# Patient Record
Sex: Male | Born: 1985
Health system: Southern US, Community
[De-identification: ages and names within clinical notes are randomized; demographics above are authoritative.]

## PROBLEM LIST (undated history)

## (undated) ENCOUNTER — Ambulatory Visit (HOSPITAL_COMMUNITY): Admission: EM | Payer: 59

## (undated) VITALS — BP 128/65 | HR 101 | Temp 98.6°F | Resp 18 | Ht 73.0 in | Wt 256.0 lb

## (undated) DIAGNOSIS — R51 Headache: Secondary | ICD-10-CM

## (undated) DIAGNOSIS — I10 Essential (primary) hypertension: Secondary | ICD-10-CM

## (undated) DIAGNOSIS — I219 Acute myocardial infarction, unspecified: Secondary | ICD-10-CM

## (undated) DIAGNOSIS — R519 Headache, unspecified: Secondary | ICD-10-CM

## (undated) DIAGNOSIS — F32A Depression, unspecified: Secondary | ICD-10-CM

## (undated) DIAGNOSIS — D849 Immunodeficiency, unspecified: Secondary | ICD-10-CM

## (undated) DIAGNOSIS — Z8781 Personal history of (healed) traumatic fracture: Secondary | ICD-10-CM

## (undated) DIAGNOSIS — F329 Major depressive disorder, single episode, unspecified: Secondary | ICD-10-CM

## (undated) DIAGNOSIS — F99 Mental disorder, not otherwise specified: Secondary | ICD-10-CM

## (undated) DIAGNOSIS — B20 Human immunodeficiency virus [HIV] disease: Secondary | ICD-10-CM

## (undated) HISTORY — DX: Mental disorder, not otherwise specified: F99

## (undated) HISTORY — DX: Depression, unspecified: F32.A

## (undated) HISTORY — DX: Human immunodeficiency virus (HIV) disease: B20

## (undated) HISTORY — DX: Major depressive disorder, single episode, unspecified: F32.9

## (undated) HISTORY — DX: Essential (primary) hypertension: I10

## (undated) HISTORY — PX: NO PAST SURGERIES: SHX2092

---

## 2008-01-03 ENCOUNTER — Ambulatory Visit: Payer: Self-pay | Admitting: Psychiatry

## 2008-01-04 ENCOUNTER — Inpatient Hospital Stay (HOSPITAL_COMMUNITY): Admission: AD | Admit: 2008-01-04 | Discharge: 2008-01-09 | Payer: Self-pay | Admitting: Psychiatry

## 2008-10-29 ENCOUNTER — Emergency Department (HOSPITAL_COMMUNITY): Admission: EM | Admit: 2008-10-29 | Discharge: 2008-10-29 | Payer: Self-pay | Admitting: *Deleted

## 2010-10-05 NOTE — H&P (Signed)
NAMEHARJOT, Hector [name]                ACCOUNT NO.:  [id number]   MEDICAL RECORD NO.:  [id number]          PATIENT TYPE:  IPS   LOCATION:  0405                          FACILITY:  [facility]   PHYSICIAN:  Anselm Jungling, MD  DATE OF BIRTH:  [birth year]   DATE OF ADMISSION:  01/04/2008  DATE OF DISCHARGE:                       PSYCHIATRIC ADMISSION ASSESSMENT   This is an involuntary admission to the services of Dr. Geralyn Flash.   IDENTIFYING INFORMATION:  This is a 25 year old, single, African  American male.  He presented to the emergency department at [facility] yesterday.  He reported that he was having suicidal, as well as  homicidal thoughts.  He states that he has been in the hospital before  for the same.  Last time was about a year ago.  He states the voices  keep getting stronger and louder.   PAST PSYCHIATRIC HISTORY:  He reports that he was admitted to [facility] back in 03/2007, for depression.   SOCIAL HISTORY:  He reports that he is a high school graduate in 2006.  He is single.  He has no children.  He received some type of disability  payment from 07/22/1990 to 10/2004, for learning differences, bipolar  and depression.  He states he is currently reapplying for re-  establishment of these benefits.   FAMILY HISTORY:  He states two of his maternal aunts were bipolar.  Both  are deceased.   ALCOHOL AND DRUG HISTORY:  He denies and indeed, he had no alcohol and  his UDS was negative.   MEDICAL HISTORY:  None.   PRIMARY CARE Karelyn Brisby:  None and he has no psychiatric care at this  time.   MEDICAL PROBLEMS:  There are none substantiated.  He reported that he  was hypertensive, had diabetes and high cholesterol.  Well, his glucose  was 89 and his blood pressure is fine and he is not prescribed any  medications.   DRUG ALLERGIES:  No known drug allergies.   PHYSICAL EXAM:  He was medically cleared in the ED at [facility]  As  already stated, he had no  alcohol, no drugs in his urine.  His blood  glucose was 89, his vital signs were fine.  His other labs had no  abnormal findings.   His vital signs on admission to our unit show that he is 73 inches tall,  he weighs 229, his temperature was 97.9, blood pressure was 132/84 to  121/80, pulse is 67 to 83, respirations are 18.   MENTAL STATUS EXAM:  Today, he is alert and oriented.  He is  appropriately groomed, dressed and nourished.  His speech is not  pressured.  His mood is appropriate to the situation.  His affect had a  normal range.  Thought processes are clearing.  Specifically, he denies  any suicidal and homicidal ideations today and he denies any auditory or  visual hallucinations today.  Judgment and insight are fair.  Concentration and memory are intact.  Intelligence is average to below.  He reports that he is feeling better today.  However, he states,  I  can't sleep at night.  Last night I just dozed on and off.   Dr. Electa Sniff did see the patient yesterday.  His impression was rule out  schizoaffective disorder.   HIS HISTORICAL DIAGNOSIS:  AXIS I:  Bipolar, depressed, with psychotic  features at this point.  AXIS II:  Learning disability.  AXIS III:  None.  AXIS IV:  Economic issues.  AXIS V:  20.   PLAN:  The plan is to admit for safety and stabilization.  Dr. Electa Sniff  gave him a stat dose of Risperdal yesterday, 2 mg.  He instituted  Risperdal 2 mg at h.s. and Trazodone 50 mg at h.s.  May repeat times  one.   Today, the patient is reporting back pain.  We will get him a Xylocaine  back patch and ibuprofen 800 mg every 6 hours p.r.n. back pain.  We will  get him set up at [facility] once discharged.  Estimated  length of stay is five days.      Mickie Leonarda Salon, P.A.-C.      Anselm Jungling, MD  Electronically Signed    MD/MEDQ  D:  01/05/2008  T:  01/05/2008  Job:  [id number]

## 2010-10-08 NOTE — Discharge Summary (Signed)
Hector [name], FAGIN                ACCOUNT NO.:  [id number]   MEDICAL RECORD NO.:  [id number]          PATIENT TYPE:  IPS   LOCATION:  0402                          FACILITY:  BH   PHYSICIAN:  Anselm Jungling, MD  DATE OF BIRTH:  1985-08-07   DATE OF ADMISSION:  01/04/2008  DATE OF DISCHARGE:  01/09/2008                               DISCHARGE SUMMARY   IDENTIFYING DATA/REASON FOR ADMISSION:  The patient is a 25 year old  single African American male who stated that he had recently moved here  from the state of [place] for a family funeral.  Once here, he decided  to stay.  He reported that he was staying with an elderly cousin in the  [place] area.  He presented voluntarily due to increasing symptoms of  psychosis, including auditory hallucinations, paranoia, thoughts of  overdosing, and thoughts of poisoning others.  He denied substance  abuse.  Please refer to the admission note for further details  pertaining to the symptoms, circumstances and history that led to his  hospitalization.  He was given an initial AXIS I diagnosis of rule out  schizoaffective disorder NOS.   MEDICAL AND LABORATORY:  The patient was medically and physically  assessed by the psychiatric nurse practitioner.  He had a history of non-  insulin-dependent diabetes mellitus that had not necessarily been  evaluated or diagnosed clearly in the past, but the patient seemed to  have some awareness of previous problems with hyperglycemia.  Nonetheless, he was treated with a regimen of metformin 500 mg p.o.  q.a.m. after he showed a daily pattern of hyperglycemia.  At the time of  discharge, he was referred to a medical clinic in [facility] for followup  regarding this development.  There were no other significant medical  issues.   HOSPITAL COURSE:  The patient was admitted to the adult inpatient  psychiatric service.  He presented as a well-nourished, normally-  developed male who was alert, fully oriented,  very polite, pleasant, and  seemed well organized.  He did not appear to have a psychotic disorder  but reported auditory hallucinations.  He made no overtly delusional  statements.  His mood was depressed with sad affect.  He verbalized a  strong desire for help.   The patient's complaints of auditory hallucinations were taken at face  value.  As such, he was started on low-dose Risperdal.  He reported this  helpful over the next few days.  He was involved in various therapeutic  groups and activities and was a good participant in the treatment  program.  He stated on the fourth hospital day that with respect to the  medication effect, I think different; I don't worry about what people  say no more.  This gave the impression that the patient had perhaps  been dealing with some paranoid ideation and affect at the time of  admission.   The patient worked with case management towards an aftercare plan.  He  eventually agreed to outpatient followup as follows below.   AFTERCARE:  The patient was to follow up at [facility] with an appointment on January 11, 2008.   DISCHARGE MEDICATIONS:  Risperdal 4 mg q.h.s. and metformin 500 mg p.o.  q.a.m.   He was directed to follow up for his medical problems at the [facility] in [place] on [address].  He was instructed when and  how to arrive there.   DISCHARGE DIAGNOSES:  AXIS I:  Schizoaffective disorder NOS.  AXIS II:  Deferred.  AXIS III:  Diabetes mellitus, NOS.  AXIS IV:  Stressors severe.  AXIS V:  GAF on discharge 60.      Anselm Jungling, MD  Electronically Signed     SPB/MEDQ  D:  01/15/2008  T:  01/15/2008  Job:  [id number]

## 2011-02-18 LAB — GLUCOSE, CAPILLARY: Glucose-Capillary: 147 — ABNORMAL HIGH

## 2011-09-25 ENCOUNTER — Encounter (HOSPITAL_COMMUNITY): Payer: Self-pay | Admitting: *Deleted

## 2011-09-25 ENCOUNTER — Emergency Department (INDEPENDENT_AMBULATORY_CARE_PROVIDER_SITE_OTHER)
Admission: EM | Admit: 2011-09-25 | Discharge: 2011-09-25 | Disposition: A | Discharge status: Home / Self Care | Payer: Medicare Other | Source: Home / Self Care | Attending: Emergency Medicine | Admitting: Emergency Medicine

## 2011-09-25 DIAGNOSIS — T148XXA Other injury of unspecified body region, initial encounter: Secondary | ICD-10-CM

## 2011-09-25 DIAGNOSIS — T148 Other injury of unspecified body region: Secondary | ICD-10-CM

## 2011-09-25 DIAGNOSIS — W57XXXA Bitten or stung by nonvenomous insect and other nonvenomous arthropods, initial encounter: Secondary | ICD-10-CM

## 2011-09-25 MED ORDER — HYDROXYZINE HCL 25 MG PO TABS: 25.0000 mg | ORAL_TABLET | Freq: Four times a day (QID) | ORAL | Status: DC

## 2011-09-25 NOTE — Discharge Instructions (Signed)
Bedbugs Bedbugs are tiny bugs that live in and around beds. During the day, they hide in mattresses and other places near beds. They come out at night and bite people lying in bed. They need blood to live and grow. Bedbugs can be found in beds anywhere. Usually, they are found in places where many people come and go (hotels, shelters, hospitals). It does not matter whether the place is dirty or clean. Getting bitten by bedbugs rarely causes a medical problem. The biggest problem can be getting rid of them. This often takes the work of a pest control expert. CAUSES  Less use of pesticides. Bedbugs were common before the 1950s. Then, strong pesticides such as DDT nearly wiped them out. Today, these pesticides are not used because they harm the environment and can cause health problems.   More travel. Besides mattresses, bedbugs can also live in clothing and luggage. They can come along as people travel from place to place. Bedbugs are more common in certain parts of the world. When people travel to those areas, the bugs can come home with them.   Presence of birds and bats. Bedbugs often infest birds and bats. If you have these animals in or near your home, bedbugs may infest your house, too.  SYMPTOMS It does not hurt to be bitten by a bedbug. You will probably not wake up when you are bitten. Bedbugs usually bite areas of the skin that are not covered. Symptoms may show when you wake up, or they may take a day or more to show up. Symptoms may include:  Small red bumps on the skin. These might be lined up in a row or clustered in a group.   A darker red dot in the middle of red bumps.   Blisters on the skin. There may be swelling and very bad itching. These may be signs of an allergic reaction. This does not happen often.  DIAGNOSIS Bedbug bites might look and feel like other types of insect bites. The bugs do not stay on the body like ticks or lice. They bite, drop off, and crawl away to hide.  Your caregiver will probably:  Ask about your symptoms.   Ask about your recent activities and travel.   Check your skin for bedbug bites.   Ask you to check at home for signs of bedbugs. You should look for:   Spots or stains on the bed or nearby. This could be from bedbugs that were crushed or from their eggs or waste.   Bedbugs themselves. They are reddish-brown, oval, and flat. They do not fly. They are about the size of an apple seed.   Places to look for bedbugs include:   Beds. Check mattresses, headboards, box springs, and bed frames.   On drapes and curtains near the bed.   Under carpeting in the bedroom.   Behind electrical outlets.   Behind any wallpaper that is peeling.   Inside luggage.  TREATMENT Most bedbug bites do not need treatment. They usually go away on their own in a few days. The bites are not dangerous. However, treatment may be needed if you have scratched so much that your skin has become infected. You may also need treatment if you are allergic to bedbug bites. Treatment options include:  A drug that stops swelling and itching (corticosteroid). Usually, a cream is rubbed on the skin. If you have a bad rash, you may be given a corticosteroid pill.   Oral antihistamines. These are   pills to help control itching.   Antibiotic medicines. An antibiotic may be prescribed for infected skin.  HOME CARE INSTRUCTIONS   Take any medicine prescribed by your caregiver for your bites. Follow the directions carefully.   Consider wearing pajamas with long sleeves and pant legs.   Your bedroom may need to be treated. A pest control expert should make sure the bedbugs are gone. You may need to throw away mattresses or luggage. Ask the pest control expert what you can do to keep the bedbugs from coming back. Common suggestions include:   Putting a plastic cover over your mattress.   Washing and drying your clothes and bedding in hot water and a hot dryer. The  temperature should be hotter than 120 F (48.9 C). Bedbugs are killed by high temperatures.   Vacuuming carefully all around your bed. Vacuum in all cracks and crevices where the bugs might hide. Do this often.   Carefully checking all used furniture, bedding, or clothes that you bring into your house.   Eliminating bird nests and bat roosts.   If you get bedbug bites when traveling, check all your possessions carefully before bringing them into your house. If you find any bugs on clothes or in your luggage, consider throwing those items away.  SEEK MEDICAL CARE IF:  You have red bug bites that keep coming back.   You have red bug bites that itch badly.   You have bug bites that cause a skin rash.   You have scratch marks that are red and sore.  SEEK IMMEDIATE MEDICAL CARE IF: You have a fever. Document Released: 06/11/2010 Document Revised: 04/28/2011 Document Reviewed: 06/11/2010 Exit[employer] Patient Information 2012 [employer]. 

## 2011-09-25 NOTE — ED Provider Notes (Addendum)
History     CSN: [id number]  Arrival date & time 09/25/11  1446   First MD Initiated Contact with Patient 09/25/11 1453      Chief Complaint  Patient presents with  . Insect Bite    (Consider location/radiation/quality/duration/timing/severity/associated sxs/prior treatment) HPI Comments: Patient woke up this morning (slept overnight in a local motel)  With a "raised rashes" on both, his left arm left shoulder upper torso some on his back "it itches a lot and her swollen". I think something bit me overnight. Patient denies any difficulty breathing or swallowing. This rash is present on his lower extremities.  The history is provided by the patient.    History reviewed. No pertinent past medical history.  History reviewed. No pertinent past surgical history.  History reviewed. No pertinent family history.  History  Substance Use Topics  . Smoking status: Not on file  . Smokeless tobacco: Not on file  . Alcohol Use: Not on file      Review of Systems  Constitutional: Negative for fever, chills, activity change and fatigue.  HENT: Negative for nosebleeds and congestion.   Eyes: Negative for pain, redness and itching.  Skin: Positive for rash. Negative for color change and wound.    Allergies  Review of patient's allergies indicates no known allergies.  Home Medications  No current outpatient prescriptions on file.  BP 143/82  Pulse 90  Temp(Src) 98.4 F (36.9 C) (Oral)  Resp 14  SpO2 99%  Physical Exam  Nursing note and vitals reviewed. Constitutional: He appears well-developed and well-nourished.  HENT:  Head: Normocephalic.  Mouth/Throat: No oropharyngeal exudate.  Eyes: Conjunctivae are normal. No scleral icterus.  Neck: Neck supple.  Cardiovascular: Normal rate.   No murmur heard. Pulmonary/Chest: Effort normal and breath sounds normal. No respiratory distress. He has no decreased breath sounds. He has no wheezes. He has no rhonchi. He has no rales.    Abdominal: Soft.  Musculoskeletal: He exhibits no edema and no tenderness.  Lymphadenopathy:    He has no cervical adenopathy.  Neurological: He is alert.  Skin: Rash noted. Rash is papular. There is erythema.       ED Course  Procedures (including critical care time)  Labs Reviewed - No data to display No results found.   No diagnosis found.    MDM  Patient with a to 10 different lesions consistent with an insect bite after having stayed in a motel. No further symptoms. Him to return if any further concerns or changes        Jimmie Molly, MD 09/25/11 1839  Jimmie Molly, MD 09/25/11 [phone]

## 2011-09-25 NOTE — ED Notes (Signed)
Co insect bite this am while staying at a motel, raised area to left arm, left shoulder, and underneath right eye.  Co itching, has taken benadryl, denies any difficulty breathing or swallowing.

## 2011-10-02 ENCOUNTER — Encounter (HOSPITAL_COMMUNITY): Payer: Self-pay | Admitting: Licensed Clinical Social Worker

## 2011-10-02 ENCOUNTER — Telehealth (HOSPITAL_COMMUNITY): Payer: Self-pay | Admitting: Licensed Clinical Social Worker

## 2011-10-02 ENCOUNTER — Inpatient Hospital Stay (HOSPITAL_COMMUNITY)
Admission: EM | Admit: 2011-10-02 | Discharge: 2011-10-08 | DRG: 885 | Disposition: A | Discharge status: Home / Self Care | Payer: Medicare Other | Source: Other Acute Inpatient Hospital | Attending: Psychiatry | Admitting: Psychiatry

## 2011-10-02 DIAGNOSIS — F5105 Insomnia due to other mental disorder: Secondary | ICD-10-CM

## 2011-10-02 DIAGNOSIS — I10 Essential (primary) hypertension: Secondary | ICD-10-CM | POA: Diagnosis present

## 2011-10-02 DIAGNOSIS — Z9114 Patient's other noncompliance with medication regimen: Secondary | ICD-10-CM

## 2011-10-02 DIAGNOSIS — Z91199 Patient's noncompliance with other medical treatment and regimen due to unspecified reason: Secondary | ICD-10-CM

## 2011-10-02 DIAGNOSIS — G4700 Insomnia, unspecified: Secondary | ICD-10-CM | POA: Diagnosis present

## 2011-10-02 DIAGNOSIS — F250 Schizoaffective disorder, bipolar type: Secondary | ICD-10-CM | POA: Diagnosis present

## 2011-10-02 DIAGNOSIS — IMO0002 Reserved for concepts with insufficient information to code with codable children: Secondary | ICD-10-CM

## 2011-10-02 DIAGNOSIS — Z9119 Patient's noncompliance with other medical treatment and regimen: Secondary | ICD-10-CM

## 2011-10-02 DIAGNOSIS — F259 Schizoaffective disorder, unspecified: Principal | ICD-10-CM | POA: Diagnosis present

## 2011-10-02 HISTORY — DX: Personal history of (healed) traumatic fracture: Z87.81
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
## 2011-10-03 ENCOUNTER — Encounter (HOSPITAL_COMMUNITY): Payer: Self-pay | Admitting: *Deleted

## 2011-10-03 DIAGNOSIS — F250 Schizoaffective disorder, bipolar type: Secondary | ICD-10-CM | POA: Diagnosis present

## 2011-10-03 DIAGNOSIS — F259 Schizoaffective disorder, unspecified: Principal | ICD-10-CM

## 2011-10-03 MED ORDER — ALUM & MAG HYDROXIDE-SIMETH 200-200-20 MG/5ML PO SUSP
30.0000 mL | ORAL | Status: DC | PRN
  Administered 2011-10-04: 30 mL via ORAL

## 2011-10-03 MED ORDER — ACETAMINOPHEN 325 MG PO TABS
650.0000 mg | ORAL_TABLET | Freq: Four times a day (QID) | ORAL | Status: DC | PRN
  Administered 2011-10-07: 650 mg via ORAL

## 2011-10-03 MED ORDER — CARBAMAZEPINE ER 200 MG PO TB12
200.0000 mg | ORAL_TABLET | ORAL | Status: DC
  Administered 2011-10-03: 200 mg via ORAL
  Filled 2011-10-03 (×5): qty 1

## 2011-10-03 MED ORDER — MAGNESIUM HYDROXIDE 400 MG/5ML PO SUSP: 30.0000 mL | Freq: Every day | ORAL | Status: DC | PRN

## 2011-10-03 MED ORDER — CARBAMAZEPINE ER 200 MG PO TB12
200.0000 mg | ORAL_TABLET | Freq: Every day | ORAL | Status: DC
  Administered 2011-10-03: 200 mg via ORAL
  Filled 2011-10-03 (×4): qty 1

## 2011-10-03 MED ORDER — CITALOPRAM HYDROBROMIDE 20 MG PO TABS
20.0000 mg | ORAL_TABLET | Freq: Every day | ORAL | Status: DC
  Administered 2011-10-03 – 2011-10-08 (×6): 20 mg via ORAL
  Filled 2011-10-03: qty 1
  Filled 2011-10-03: qty 7
  Filled 2011-10-03 (×6): qty 1

## 2011-10-03 MED ORDER — TRAZODONE HCL 50 MG PO TABS: 50.0000 mg | ORAL_TABLET | Freq: Every evening | ORAL | Status: DC | PRN

## 2011-10-03 MED ORDER — HYDROXYZINE HCL 50 MG PO TABS
50.0000 mg | ORAL_TABLET | Freq: Every evening | ORAL | Status: DC | PRN
  Administered 2011-10-03 – 2011-10-07 (×2): 50 mg via ORAL

## 2011-10-03 MED ORDER — ZOLPIDEM TARTRATE 10 MG PO TABS: 10.0000 mg | ORAL_TABLET | Freq: Every evening | ORAL | Status: DC | PRN

## 2011-10-03 NOTE — Tx Team (Signed)
Initial Interdisciplinary Treatment Plan  PATIENT STRENGTHS: (choose at least two) Ability for insight Average or above average intelligence Capable of independent living Communication skills Motivation for treatment/growth Work skills  PATIENT STRESSORS: Educational concerns Financial difficulties Marital or family conflict Medication change or noncompliance Occupational concerns   PROBLEM LIST: Problem List/Patient Goals Date to be addressed Date deferred Reason deferred Estimated date of resolution  Depression      Anxiety      SI                                           DISCHARGE CRITERIA:  Ability to meet basic life and health needs Adequate post-discharge living arrangements Improved stabilization in mood, thinking, and/or behavior Medical problems require only outpatient monitoring Motivation to continue treatment in a less acute level of care Need for constant or close observation no longer present Reduction of life-threatening or endangering symptoms to within safe limits Safe-care adequate arrangements made Verbal commitment to aftercare and medication compliance  PRELIMINARY DISCHARGE PLAN: Outpatient therapy Return to previous work or school arrangements  PATIENT/FAMIILY INVOLVEMENT: This treatment plan has been presented to and reviewed with the patient, Hector [name].  The patient has been given the opportunity to ask questions and make suggestions.  Arturo Morton 10/03/2011, 1:24 AM

## 2011-10-03 NOTE — Progress Notes (Signed)
Pt currently in group. Pt has had no complaints and interacts well in the milieu. Pt was offered support and encouragement. Pt receptive to treatment and safety maintained on the unit.
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
## 2011-10-03 NOTE — Progress Notes (Addendum)
[facility] Group Notes:  (Counselor/Nursing/MHT/Case Management/Adjunct)  10/03/2011   11:00 Overcoming Obstacles to Wellness  Type of Therapy:  Group Therapy  Participation Level:  Did Not Attend       [name] 10/03/2011  2:30 PM     [facility] Group Notes:  (Counselor/Nursing/MHT/Case Management/Adjunct) 10/03/2011  1:15pm Breathing & Meditation for Anger/Anxiety   Type of Therapy:  Group Therapy  Participation Level:  Did Not Attend - Hector [name] came to the door after group had started, but chose not to come into group  [name] 10/03/2011  3:02 PM
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
## 2011-10-03 NOTE — Progress Notes (Addendum)
[facility] MD Progress Note  10/03/2011 4:12 PM  Diagnosis:  Axis I: Schizoaffective Disorder  ADL's:  Intact  Sleep: Poor  Appetite:  Poor  Suicidal Ideation:  Pt denies any thoughts of being dead now. Homicidal Ideation:  Denies adamantly any homicidal thoughts.  Mental Status Examination/Evaluation: Objective:  Appearance: Casual  Eye Contact::  Good  Speech:  Clear and Coherent  Volume:  Normal  Mood:  Dysphoric  Affect:  Congruent  Thought Process:  Coherent  Orientation:  Full  Thought Content:  WDL  Suicidal Thoughts:  No  Homicidal Thoughts:  No  Memory:  Immediate;   Fair  Judgement:  Fair  Insight:  Fair  Psychomotor Activity:  Normal  Concentration:  Fair  Recall:  Fair  Akathisia:  No  Handed:  Right  AIMS (if indicated):     Assets:  Communication Skills Desire for Improvement  Sleep:  Number of Hours: 4    Vital Signs:Blood pressure 139/79, pulse 109, temperature 99 F (37.2 C), temperature source Oral, resp. rate 18, height 6\' 1"  (1.854 m), weight 116.121 kg (256 lb). Current Medications: Current Facility-Administered Medications  Medication Dose Route Frequency Provider Last Rate Last Dose  . acetaminophen (TYLENOL) tablet 650 mg  650 mg Oral Q6H PRN Wonda Cerise, MD      . alum & mag hydroxide-simeth (MAALOX/MYLANTA) 200-200-20 MG/5ML suspension 30 mL  30 mL Oral Q4H PRN Wonda Cerise, MD      . carbamazepine (TEGRETOL XR) 12 hr tablet 200 mg  200 mg Oral [id number] Mike Craze, MD      . citalopram (CELEXA) tablet 20 mg  20 mg Oral Daily Sanjuana Kava, NP   20 mg at 10/03/11 1210  . magnesium hydroxide (MILK OF MAGNESIA) suspension 30 mL  30 mL Oral Daily PRN Wonda Cerise, MD      . traZODone (DESYREL) tablet 50 mg  50 mg Oral QHS PRN,MR X 1 Wonda Cerise, MD      . DISCONTD: carbamazepine (TEGRETOL XR) 12 hr tablet 200 mg  200 mg Oral Daily Sanjuana Kava, NP   200 mg at 10/03/11 1208    Lab Results: No results found for this or any previous visit (from the  past 48 hour(s)).  Physical Findings: AIMS:  , ,  ,  ,    CIWA:    COWS:     Treatment Plan Summary: Daily contact with patient to assess and evaluate symptoms and progress in treatment Medication management  Plan: Admit, start on Tegretol for anger and mood dyscontrol, Trazodone for insomnia, and Celexa for depression.  Rushton Early 10/03/2011, 4:12 PM

## 2011-10-03 NOTE — Progress Notes (Signed)
Patient ID: Hector [name], male   DOB: 09/07/1985, 26 y.o.   MRN: [id number] Pt reports fair sleep and poor appetite.  His energy  Level is low and he says his ability to pay attention is improving.  His depression is a 10 and his hopelessness is 9.  He is c/o a headache that is a 10/10.  Offered pt tylenol which he declined.  He has been attending groups.  Started on celexa and tegretol today.  On his self inventory he stated that he needs to start taking care of himself first.
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
## 2011-10-03 NOTE — H&P (Signed)
Medical/psychiatric screening examination/treatment/procedure(s) were performed by non-physician practitioner and as supervising physician I was immediately available for consultation/collaboration.  I have seen and examined this patient and agree the major elements of this evaluation.  

## 2011-10-04 DIAGNOSIS — F489 Nonpsychotic mental disorder, unspecified: Secondary | ICD-10-CM

## 2011-10-04 DIAGNOSIS — F5105 Insomnia due to other mental disorder: Secondary | ICD-10-CM | POA: Diagnosis present

## 2011-10-04 MED ORDER — CARBAMAZEPINE 200 MG PO TABS
400.0000 mg | ORAL_TABLET | Freq: Every evening | ORAL | Status: DC | PRN
  Filled 2011-10-04 (×4): qty 2

## 2011-10-04 MED ORDER — CARBAMAZEPINE 200 MG PO TABS
200.0000 mg | ORAL_TABLET | ORAL | Status: DC
  Administered 2011-10-04 – 2011-10-06 (×4): 200 mg via ORAL
  Filled 2011-10-04 (×6): qty 1

## 2011-10-04 MED ORDER — CARBAMAZEPINE 200 MG PO TABS
400.0000 mg | ORAL_TABLET | Freq: Every day | ORAL | Status: DC
  Administered 2011-10-04 – 2011-10-05 (×2): 400 mg via ORAL
  Filled 2011-10-04 (×3): qty 2

## 2011-10-04 MED ORDER — QUETIAPINE FUMARATE 50 MG PO TABS
50.0000 mg | ORAL_TABLET | Freq: Every evening | ORAL | Status: DC | PRN
  Administered 2011-10-04 (×2): 50 mg via ORAL
  Filled 2011-10-04 (×6): qty 1

## 2011-10-04 MED ORDER — IBUPROFEN 200 MG PO TABS
400.0000 mg | ORAL_TABLET | ORAL | Status: DC | PRN
  Administered 2011-10-05 – 2011-10-07 (×2): 400 mg via ORAL
  Filled 2011-10-04 (×2): qty 2
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
## 2011-10-04 NOTE — Tx Team (Signed)
Interdisciplinary Treatment Plan Update (Adult)  Date:  10/04/2011  Time Reviewed:  10:53 AM   Progress in Treatment: Attending groups:   Yes   Participating in groups:  Yes Taking medication as prescribed:  Yes Tolerating medication:  Yes Family/Significant othe contact made: Patient declined to allow collateral contacgt Patient understands diagnosis:  Yes Discussing patient identified problems/goals with staff: Yes Medical problems stabilized or resolved: Yes Denies suicidal/homicidal ideation:Yes Issues/concerns per patient self-inventory:  Other:  New problem(s) identified:  Reason for Continuation of Hospitalization:  Interventions implemented related to continuation of hospitalization:  Additional comments:  Estimated length of stay: 1-3 days  Discharge Plan: Home with godmother and outpatient follow up in [facility] goal(s):  Review of initial/current patient goals per problem list:    1.  Goal(s): Eliminate SI/other thoughts of self harm   Met: Yes  Target date: d/c  As evidenced by: Patient will no longer endorsing SI/other thought self harm    2.  Goal (s): Reduce depression/anxiety (rated at ten on admission)  Met:  No  Target date: d/c  As evidenced by: Patient will rate symptoms at four or below (symptoms rated at seven today)  3.  Goal(s):  Reduce anger  Met:  No  Target date: d/c  As evidenced by: Patient will report he is able to control his anger  4.  Goal(s): .stabilize on meds  Met:  No  Target date: d/c  As evidenced by: Patient will report being stable on medications - symptoms have decreased    Attendees: Patient:  Hector [name]  10/04/2011 11:20 AM  Family:     Physician:  Orson Aloe, MD 10/04/2011 10:53 AM   Nursing:   Shelda Jakes, RN 10/04/2011 10:53 AM   CaseManager:  Juline Patch, LCSW 10/04/2011 10:53 AM   Counselor:  Angus Palms, LCSW 10/04/2011 10:53 AM   Other:  Neill Loft, RN 10/04/2011 10:53 AM    Other:  Reyes Ivan, LCSWA 10/04/2011  10:53 AM   Other:     Other:      Scribe for Treatment Team:   Wynn Banker, LCSW,  10/04/2011 10:53 AM

## 2011-10-04 NOTE — Progress Notes (Addendum)
[facility] MD Progress Note  10/04/2011 11:25 AM  Diagnosis:  Axis I: Schizoaffective Disorder  ADL's:  Intact  Sleep: Poor, didn't take Ambien or Trazodone.  Will increase HS dose of Tegretol, add back Seroquel at HS, and discontinue Ambien  Appetite:  Poor  Suicidal Ideation:  Pt denies any thoughts of being dead now. Homicidal Ideation:  Denies adamantly any homicidal thoughts.  Mental Status Examination/Evaluation: Objective:  Appearance: Casual  Eye Contact::  Good  Speech:  Clear and Coherent  Volume:  Normal  Mood:  Euthymic  Affect:  Congruent  Thought Process:  Coherent  Orientation:  Full  Thought Content:  WDL  Suicidal Thoughts:  No  Homicidal Thoughts:  No  Memory:  Immediate;   Fair  Judgement:  Fair  Insight:  Fair  Psychomotor Activity:  Normal  Concentration:  Fair  Recall:  Fair  Akathisia:  No  Handed:  Right  AIMS (if indicated):     Assets:  Communication Skills Desire for Improvement  Sleep:  Number of Hours: 6.5    ROS: Neuro: notes pretty bad headache which does not respond to Tylenol, will offer Motrin 400mg  q4h, no, ataxia, weakness  GI: no N/V/D/cramps/constipation  MS: no weakness, muscle cramps, aches.  Vital Signs:Blood pressure 124/78, pulse 95, temperature 99.2 F (37.3 C), temperature source Oral, resp. rate 16, height 6\' 1"  (1.854 m), weight 116.121 kg (256 lb). Current Medications: Current Facility-Administered Medications  Medication Dose Route Frequency Provider Last Rate Last Dose  . acetaminophen (TYLENOL) tablet 650 mg  650 mg Oral Q6H PRN Wonda Cerise, MD      . alum & mag hydroxide-simeth (MAALOX/MYLANTA) 200-200-20 MG/5ML suspension 30 mL  30 mL Oral Q4H PRN Wonda Cerise, MD      . carbamazepine (TEGRETOL) tablet 200 mg  200 mg Oral BH-[id number] Mike Craze, MD      . carbamazepine (TEGRETOL) tablet 400 mg  400 mg Oral QHS,MR X 1 Mike Craze, MD      . citalopram (CELEXA) tablet 20 mg  20 mg Oral Daily Sanjuana Kava, NP   20  mg at 10/03/11 1210  . hydrOXYzine (ATARAX/VISTARIL) tablet 50 mg  50 mg Oral QHS PRN Mickeal Skinner, MD   50 mg at 10/03/11 2207  . ibuprofen (ADVIL,MOTRIN) tablet 400 mg  400 mg Oral Q4H PRN Mike Craze, MD      . magnesium hydroxide (MILK OF MAGNESIA) suspension 30 mL  30 mL Oral Daily PRN Wonda Cerise, MD      . QUEtiapine (SEROQUEL) tablet 50 mg  50 mg Oral QHS,MR X 1 Mike Craze, MD      . traZODone (DESYREL) tablet 50 mg  50 mg Oral QHS PRN,MR X 1 Wonda Cerise, MD      . DISCONTD: carbamazepine (TEGRETOL XR) 12 hr tablet 200 mg  200 mg Oral Daily Sanjuana Kava, NP   200 mg at 10/03/11 1208  . DISCONTD: carbamazepine (TEGRETOL XR) 12 hr tablet 200 mg  200 mg Oral [id number] Mike Craze, MD   200 mg at 10/03/11 2146  . DISCONTD: zolpidem (AMBIEN) tablet 10 mg  10 mg Oral QHS PRN Mickeal Skinner, MD        Lab Results:  Results for orders placed during the hospital encounter of 10/02/11 (from the past 48 hour(s))  HEMOGLOBIN A1C     Status: Abnormal   Collection Time   10/03/11  8:01 PM      Component Value  Range Comment   Hemoglobin A1C 5.7 (*) <5.7 (%)    Mean Plasma Glucose 117 (*) <117 (mg/dL)     Physical Findings: AIMS:  , ,  ,  ,    CIWA:    COWS:     Treatment Plan Summary: Daily contact with patient to assess and evaluate symptoms and progress in treatment Medication management  Discussion/Plan: Seen in treatment team meeting and after treatment team meeting.   Problem: Schizoaffective D/O: Pt notes no hallucinations, but did not sleep at all last night.  She has slept well on only 50 mg of Seroquel.   Will restart that.  Problem: Insomnia: Will push Tegretol at [facility] for better sedation at HS and to help better with anger management and add back Seroquel 50 mg.  Need for further hospital stay: to optimally adjust medications, learn more coping strategies.  Cris Gibby 10/04/2011, 11:25 AM

## 2011-10-04 NOTE — Progress Notes (Signed)
[facility] Adult Inpatient Family/Significant Other Suicide Prevention Education  Suicide Prevention Education:   Patient Refusal for Family/Significant Other Suicide Prevention Education: The patient Hector [name] has refused to provide written consent for family/significant other to be provided Family/Significant Other Suicide Prevention Education during admission and/or prior to discharge.  Physician notified.  Jathen reported that he has no one supportive in his life (see recent note by this counselor) and does not want anyone contacted. Suicide prevention information - including risk factors, warning signs, and crisis resources - were discussed with [facility] and he was given suicide prevention pamphlet. He verbalized understanding and did not have any further questions.   [name] 10/04/2011, 9:51 AM

## 2011-10-04 NOTE — Progress Notes (Signed)
Grief and Loss Group  Co-facilitated group on 500 hall w/ Counseling Intern Corliss Marcus, LPCA, NCC. Reviewed/established group rules (re: privacy, respect) and introduced topic of loss and grief reactions. Group was highly engaged w/ facilitators and w/ one another, characterized by open sharing and support. Group focused on types of loss, largely on death, unexpected death, and suicide.  Pt was quiet throughout the group and did not speak, but was attentive throughout.  Hector Lamba B MS, LPCA, NCC
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
## 2011-10-04 NOTE — Progress Notes (Signed)
Hector [name] slept most of the morning...stating he was " up most of the night.." He is cooperative, pleasant and honest in his explanation of " my anger problems". A HE takes his meds as ordered by the MD. He makes good eye contact. He says he needs help...gets visibly angry as he speaks about the recent death of his mother and father and the discord between he and his cousin.He completed his self eval this AM and on it he wrote he denied SI, he rated his depression and hopelessness " 5 / 5 " and stated he wants to " start to work on [employer]".  R Safety is maintained and POC includes fostering therapeutic relationship already establsiehd PD RN [facility]

## 2011-10-04 NOTE — Progress Notes (Signed)
[facility] Group Notes: (Counselor/Nursing/MHT/Case Management/Adjunct) 10/04/2011   @ 11:00 Feelings About Diagnosis  Type of Therapy:  Group Therapy  Participation Level:  None  Participation Quality: None   Affect:  Blunted  Cognitive:  Appropriate  Insight:  None  Engagement in Group: None  Engagement in Therapy:  None  Modes of Intervention:  Support and Exploration  Summary of Progress/Problems: Hector [name] came out of group a few times but did not share.   [name] 10/04/2011  2:24 PM

## 2011-10-04 NOTE — Progress Notes (Signed)
Patient ID: Hector [name], male   DOB: May 15, 1986, 26 y.o.   MRN: [id number] Pt. In bed, no distress noted, eyes closed, resp. Even, unlabored. Staff will monitor q26min for safety.

## 2011-10-04 NOTE — Progress Notes (Signed)
Patient seen during during d/c planning group and or treatment team.  He reports difficulty sleeping last night.  Patient is denying SI/HI.  He rates depression and anxiety at seven. He endorses having a bad headache. Patient advised that he continues to have problems with controlling anger.  MD making adjustments to medications to help with anger.  Patient has refused to allow any collateral contact.

## 2011-10-04 NOTE — Progress Notes (Signed)
Nutrition Note:  Pt triggered on Nutrition Risk Report for weight loss.  Chart reviewed.  Pt reports no weight loss but that his weight is the highest it has ever been and this makes him depressed.  States that he has high blood pressure at times and just wants to be healthy.    Ht:  6'1" Wt:  256# BMI:  33.8 Obesity grade 1  Drinks a lot of regular soda and eats a lot of fried foods.  Has been walking but states that when he runs that his heart rate increases and he can't breath which scares him.  Instructed on weight loss.  Pt verbalizes areas to change.  Written info provided.  Oran Rein, RD [phone]

## 2011-10-05 MED ORDER — SALINE SPRAY 0.65 % NA SOLN
1.0000 | NASAL | Status: DC | PRN
  Administered 2011-10-05 – 2011-10-07 (×3): 1 via NASAL
  Filled 2011-10-05 (×2): qty 44

## 2011-10-05 MED ORDER — QUETIAPINE FUMARATE 100 MG PO TABS
100.0000 mg | ORAL_TABLET | Freq: Every evening | ORAL | Status: DC | PRN
  Administered 2011-10-05 – 2011-10-07 (×6): 100 mg via ORAL
  Filled 2011-10-05: qty 1
  Filled 2011-10-05 (×2): qty 14
  Filled 2011-10-05 (×7): qty 1

## 2011-10-05 MED ORDER — GUAIFENESIN 100 MG/5ML PO SYRP
200.0000 mg | ORAL_SOLUTION | ORAL | Status: DC | PRN
  Administered 2011-10-05 – 2011-10-07 (×2): 200 mg via ORAL
  Filled 2011-10-05: qty 118

## 2011-10-05 NOTE — Progress Notes (Signed)
Pt. Denies SI/HI and denies A/V hallucinations.  Interacting appropriately with peers.  Denies pain.  Pt. Reports wants to go forward and interested in becoming a CNA.  Encouragement and support given.

## 2011-10-05 NOTE — Progress Notes (Signed)
[facility] Group Notes: (Counselor/Nursing/MHT/Case Management/Adjunct) 10/05/2011    11:00am Emotion Regulation  Type of Therapy:  Group Therapy  Participation Level:  Active  Participation Quality:  Attentive, Sharing, Appropriate   Affect:  Appropriate  Cognitive:  Appropriate  Insight:  Good  Engagement in Group: Good  Engagement in Therapy:  Limited  Modes of Intervention:  Support and Exploration  Summary of Progress/Problems: Arthuro explored his experience of anxiety and anger. He stated that he feels them both in his mind and in his body, describing the feeling as becoming ovewhelming. Javarus processed how he has taught himself to walk away or step back from the situation, because he used to act out on those emotions, leading to negative consequences and cycles. He requested to learn some relaxation techniques to aid him in coping with anxiety and anger, then participated in the exercises, but left group afterward without discussing the experience.   [name] 10/05/2011  2:13 PM

## 2011-10-05 NOTE — Progress Notes (Signed)
Patient seen during discharge planning group.  He reports difficulty sleeping last night and being sleepy today.  Patient is rating all symptoms at ten.  He continues to have problems with anger but reports anger is under control.  Patient asked that Clinical research associate inform probation officer of hospitalization.  Message left on voice mail for probation officer to contact wrtier.

## 2011-10-05 NOTE — Progress Notes (Signed)
Lying quietly in bed with eyes closed.  Exhibiting normal sleep behavior.  Safety maintained.

## 2011-10-05 NOTE — Progress Notes (Signed)
[facility] MD Progress Note  10/05/2011 5:19 PM  Diagnosis:  Axis I: Schizoaffective Disorder  ADL's:  Intact  Sleep: Fair, pt asks for the Seroquel to be increased.  Will do that.  Appetite:  Fair  Suicidal Ideation:  Pt denies any thoughts of being suicide. Homicidal Ideation:  Denies adamantly any homicidal thoughts.  Mental Status Examination/Evaluation: Objective:  Appearance: Casual  Eye Contact::  Good  Speech:  Clear and Coherent  Volume:  Normal  Mood:  Euthymic  Affect:  Congruent  Thought Process:  Coherent  Orientation:  Full  Thought Content:  WDL  Suicidal Thoughts:  No  Homicidal Thoughts:  No  Memory:  Immediate;   Good   Judgement:  Good  Insight:  Fair  Psychomotor Activity:  Normal  Concentration:  Fair  Recall:  Fair  Akathisia:  No  Handed:  Right  AIMS (if indicated):     Assets:  Communication Skills Desire for Improvement  Sleep:  Number of Hours: 4.75    ROS: Neuro: denies ataxia, headaches, or weakness  GI: denies any N/VD/cramps/constipation  MS: denies aches, weakness, or muscle cramps.  Vital Signs:Blood pressure 145/97, pulse 102, temperature 99.2 F (37.3 C), temperature source Oral, resp. rate 18, height 6\' 1"  (1.854 m), weight 116.121 kg (256 lb). Current Medications: Current Facility-Administered Medications  Medication Dose Route Frequency Provider Last Rate Last Dose  . acetaminophen (TYLENOL) tablet 650 mg  650 mg Oral Q6H PRN Wonda Cerise, MD      . alum & mag hydroxide-simeth (MAALOX/MYLANTA) 200-200-20 MG/5ML suspension 30 mL  30 mL Oral Q4H PRN Wonda Cerise, MD   30 mL at 10/04/11 1642  . carbamazepine (TEGRETOL) tablet 200 mg  200 mg Oral [id number] Mike Craze, MD   200 mg at 10/05/11 1703  . carbamazepine (TEGRETOL) tablet 400 mg  400 mg Oral QHS Mike Craze, MD   400 mg at 10/04/11 2200  . citalopram (CELEXA) tablet 20 mg  20 mg Oral Daily Sanjuana Kava, NP   20 mg at 10/05/11 0819  . hydrOXYzine (ATARAX/VISTARIL) tablet  50 mg  50 mg Oral QHS PRN Mickeal Skinner, MD   50 mg at 10/03/11 2207  . ibuprofen (ADVIL,MOTRIN) tablet 400 mg  400 mg Oral Q4H PRN Mike Craze, MD   400 mg at 10/05/11 0819  . magnesium hydroxide (MILK OF MAGNESIA) suspension 30 mL  30 mL Oral Daily PRN Wonda Cerise, MD      . QUEtiapine (SEROQUEL) tablet 100 mg  100 mg Oral QHS,MR X 1 Mike Craze, MD      . traZODone (DESYREL) tablet 50 mg  50 mg Oral QHS PRN,MR X 1 Wonda Cerise, MD      . DISCONTD: carbamazepine (TEGRETOL) tablet 400 mg  400 mg Oral QHS,MR X 1 Mike Craze, MD      . DISCONTD: QUEtiapine (SEROQUEL) tablet 50 mg  50 mg Oral QHS,MR X 1 Mike Craze, MD   50 mg at 10/04/11 2255    Lab Results:  Results for orders placed during the hospital encounter of 10/02/11 (from the past 48 hour(s))  HEMOGLOBIN A1C     Status: Abnormal   Collection Time   10/03/11  8:01 PM      Component Value Range Comment   Hemoglobin A1C 5.7 (*) <5.7 (%)    Mean Plasma Glucose 117 (*) <117 (mg/dL)     Physical Findings: AIMS:  , ,  ,  ,  CIWA:    COWS:     Treatment Plan Summary: Daily contact with patient to assess and evaluate symptoms and progress in treatment Medication management  Discussion/Plan: Seen in consult room.   Problem: Schizoaffective D/O: Pt notes no hallucinations, but did not sleep well last night.  Mood better on the Tegretol.  He asks for the Seroquel to be increased will do that.  Problem: Insomnia: Will push Seroquel to 100 mg.  Need for further hospital stay: to optimally adjust medications, learn more coping strategies.  Madylyn Insco 10/05/2011, 5:19 PM

## 2011-10-06 DIAGNOSIS — Z91199 Patient's noncompliance with other medical treatment and regimen due to unspecified reason: Secondary | ICD-10-CM

## 2011-10-06 DIAGNOSIS — Z9114 Patient's other noncompliance with medication regimen: Secondary | ICD-10-CM

## 2011-10-06 DIAGNOSIS — Z9119 Patient's noncompliance with other medical treatment and regimen: Secondary | ICD-10-CM

## 2011-10-06 MED ORDER — QUETIAPINE FUMARATE 100 MG PO TABS: 100.0000 mg | ORAL_TABLET | Freq: Every evening | ORAL | Status: AC | PRN

## 2011-10-06 MED ORDER — CITALOPRAM HYDROBROMIDE 20 MG PO TABS: 20.0000 mg | ORAL_TABLET | Freq: Every day | ORAL | Status: DC

## 2011-10-06 MED ORDER — CARBAMAZEPINE ER 400 MG PO TB12
1000.0000 mg | ORAL_TABLET | Freq: Every day | ORAL | Status: DC
  Administered 2011-10-06 – 2011-10-07 (×2): 1000 mg via ORAL
  Filled 2011-10-06 (×3): qty 1

## 2011-10-06 MED ORDER — TRAZODONE HCL 100 MG PO TABS: 100.0000 mg | ORAL_TABLET | Freq: Every evening | ORAL | Status: DC | PRN

## 2011-10-06 MED ORDER — CARBAMAZEPINE ER 200 MG PO TB12: 1000.0000 mg | ORAL_TABLET | Freq: Every day | ORAL | Status: DC
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
## 2011-10-06 NOTE — Progress Notes (Signed)
[facility] MD Progress Note  10/06/2011 3:49 PM  Diagnosis:  Axis I: Schizoaffective Disorder and Non compliance with treatment and poor compliance with medications  ADL's:  Intact  Sleep: Good  Appetite:  Good  Suicidal Ideation:  Pt denies any thoughts of being suicide. Homicidal Ideation:  Denies adamantly any homicidal thoughts.  Mental Status Examination/Evaluation: Objective:  Appearance: Casual  Eye Contact::  Good  Speech:  Clear and Coherent  Volume:  Normal  Mood:  Euthymic  Affect:  Congruent  Thought Process:  Coherent  Orientation:  Full  Thought Content:  WDL  Suicidal Thoughts:  No  Homicidal Thoughts:  No  Memory:  Immediate;   Good   Judgement:  Good  Insight:  Lacking  Psychomotor Activity:  Normal  Concentration:  Good  Recall:  Good  Akathisia:  No  Handed:  Right  AIMS (if indicated):     Assets:  Communication Skills Desire for Improvement  Sleep:  Number of Hours: 6.25    ROS: Neuro: notes no ataxia, headaches, or weakness  GI: notes no N/VD/cramps/constipation  MS: notes no aches, weakness, or muscle cramps.  Vital Signs:Blood pressure 132/76, pulse 64, temperature 97 F (36.1 C), temperature source Oral, resp. rate 16, height 6\' 1"  (1.854 m), weight 116.121 kg (256 lb). Current Medications: Current Facility-Administered Medications  Medication Dose Route Frequency Provider Last Rate Last Dose  . acetaminophen (TYLENOL) tablet 650 mg  650 mg Oral Q6H PRN Wonda Cerise, MD      . alum & mag hydroxide-simeth (MAALOX/MYLANTA) 200-200-20 MG/5ML suspension 30 mL  30 mL Oral Q4H PRN Wonda Cerise, MD   30 mL at 10/04/11 1642  . carbamazepine (TEGRETOL XR) 12 hr tablet 1,000 mg  1,000 mg Oral QHS Mike Craze, MD      . citalopram (CELEXA) tablet 20 mg  20 mg Oral Daily Sanjuana Kava, NP   20 mg at 10/06/11 0827  . guaifenesin (ROBITUSSIN) 100 MG/5ML syrup 200 mg  200 mg Oral Q4H PRN Cleotis Nipper, MD   200 mg at 10/05/11 2233  . hydrOXYzine  (ATARAX/VISTARIL) tablet 50 mg  50 mg Oral QHS PRN Mickeal Skinner, MD   50 mg at 10/03/11 2207  . ibuprofen (ADVIL,MOTRIN) tablet 400 mg  400 mg Oral Q4H PRN Mike Craze, MD   400 mg at 10/05/11 0819  . magnesium hydroxide (MILK OF MAGNESIA) suspension 30 mL  30 mL Oral Daily PRN Wonda Cerise, MD      . QUEtiapine (SEROQUEL) tablet 100 mg  100 mg Oral QHS,MR X 1 Mike Craze, MD   100 mg at 10/05/11 2252  . sodium chloride (OCEAN) 0.65 % nasal spray 1 spray  1 spray Each Nare PRN Cleotis Nipper, MD   1 spray at 10/05/11 2252  . traZODone (DESYREL) tablet 50 mg  50 mg Oral QHS PRN,MR X 1 Wonda Cerise, MD      . DISCONTD: carbamazepine (TEGRETOL) tablet 200 mg  200 mg Oral BH-q8a2p Mike Craze, MD   200 mg at 10/06/11 1610  . DISCONTD: carbamazepine (TEGRETOL) tablet 400 mg  400 mg Oral QHS Mike Craze, MD   400 mg at 10/05/11 2233  . DISCONTD: QUEtiapine (SEROQUEL) tablet 50 mg  50 mg Oral QHS,MR X 1 Mike Craze, MD   50 mg at 10/04/11 2255    Lab Results:  No results found for this or any previous visit (from the past 48 hour(s)).  Physical Findings:  AIMS:  , ,  ,  ,    CIWA:    COWS:     Treatment Plan Summary: Daily contact with patient to assess and evaluate symptoms and progress in treatment Medication management  Discussion/Plan: Seen in treatment room.  Problem: Schizoaffective D/O: Pt notes no hallucinations, moods good on Tegretol.  Will shift to XR form and take all at bed time.  Will get level in AM before he leaves.  Problem: Insomnia: Will push Seroquel to 100 mg.  Need for further hospital stay: to optimally adjust medications, learn more coping strategies.  Hector [name] 10/06/2011, 3:49 PM

## 2011-10-06 NOTE — Progress Notes (Signed)
Patient ID: Hector [name], male   DOB: 1985/06/21, 26 y.o.   MRN: [id number] Pt. Eyes closed, resp. Even, no distress noted. Staff will monitor q36min for safety.

## 2011-10-06 NOTE — Tx Team (Signed)
Interdisciplinary Treatment Plan Update (Adult)  Date:  10/06/2011  Time Reviewed:  10:29 AM   Progress in Treatment: Attending groups: Yes, present in counseling groups yesterday but not in aftercare planning this morning. Participating in groups:  Yes Taking medication as prescribed:  Yes Tolerating medication: Yes Family/Significant othe contact made:  No, refused to allow contact Patient understands diagnosis: Yes Discussing patient identified problems/goals with staff:  Yes Medical problems stabilized or resolved: Yes Denies suicidal/homicidal ideation: Yes Issues/concerns per patient self-inventory:  No  Other:  New problem(s) identified: None  Reason for Continuation of Hospitalization: Aggression Medication stabilization  Interventions implemented related to continuation of hospitalization:  Medication stabilization, safety checks q 15 mins, group attendance  Additional comments:  Estimated length of stay: 1 day   Discharge Plan: Damont will discharge home to [facility] and follow up with [facility] there  New goal(s):  Review of initial/current patient goals per problem list:   1. Goal(s): Eliminate SI/other thoughts of self harm  Met:  Target date: by d/c  As evidenced by:  2. Goal (s): Reduce depression/anxiety to rating of 4 or less  (rated at ten on admission)  Met: Target date: d/c  As evidenced by:  3. Goal(s): Reduce anger (as evidenced by insight into problem and no outbursts of anger) Met:  Yes Target date: d/c  As evidenced by: Hector [name] is now [name] to discuss and explore his anger, has had 0 outbursts in the milleu 4. Goal(s): Stabilize on medication Met:  Target date: d/c  As evidenced by:    Attendees: Patient:  Hector [name] 10/06/2011 10:29 AM  Family:     Physician:  Dr Orson Aloe, MD 10/06/2011 10:29 AM  Nursing:   Omelia Blackwater, RN 10/06/2011 10:29 AM  Case Manager:  Juline Patch, LCSW 10/06/2011 10:29 AM  Counselor:  Angus Palms, LCSW 10/06/2011 10:29 AM  Other:  Reyes Ivan, LCSWA 10/06/2011 10:29 AM  Other:  Serena Colonel, NP 10/06/2011 10:29 AM  Other:     Other:      Scribe for Treatment Team:   [name], 10/06/2011 10:29 AM
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
## 2011-10-06 NOTE — Progress Notes (Signed)
Patient ID: Hector [name], male   DOB: 11-29-1985, 26 y.o.   MRN: [id number] Pt. Asleep but easily aroused.  NAD.  Affect is blunted, mood is manic.  Speech is not pressured, it is logical and coherent.  He is very talkative once conversation is initiated.  He states that his anger is still present but he seems to be able to control it better.  His manner and interaction is very appropriate with this Clinical research associate.  He reports a significant interest in attending group and "getting better."  He states that the seroquel is making him quite sleepy.  He denies SI/HI/AVH to this Clinical research associate.

## 2011-10-06 NOTE — Discharge Instructions (Addendum)
F O L L O W  U P   W I T H   Y O U R   D O C T O R  !!! KEEP APPOINTMENTS KEEP TAKING THE MEDICATIONS THEY WORK WHEN YOU TAKE THEM! TAKE THEM LIKE YOU ARE SUPPOSED TO! YOUR BRAIN NEEDS THE MEDICATIONS LIKE YOU NEED FOOD.  Tegretol speeds up the metabolism of a lot of things including itself.  In about a month the blood level of the Tegretol will be at about 60% of where it was when you started, so the dose will probably have to be increased.

## 2011-10-06 NOTE — Progress Notes (Signed)
[facility] Group Notes:  (Counselor/Nursing/MHT/Case Management/Adjunct)  10/06/2011  11:00am Finding Balance in Life  Type of Therapy:  Group Therapy  Participation Level:  Did Not Attend     [name] 10/06/2011  3:26 PM     [facility] Group Notes:  (Counselor/Nursing/MHT/Case Management/Adjunct) 10/06/2011   1:15pm Dysfunctional Roles   Type of Therapy:  Group Therapy  Participation Level:  Did Not Attend       [name] 10/06/2011  3:26 PM
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
## 2011-10-06 NOTE — Progress Notes (Signed)
Patient ID: Hector [name], male   DOB: 11/03/1985, 26 y.o.   MRN: [id number] Pt. Reports not ready to go home but unable to says what issues needed to be addressed, just says he had issues that he is not willing or feels comfortable enough to address with anyone. Pt. Denies SHI. Pt. Did elaborate on having a suspicion that his GF was cheating on him so he cheated on her. Pt. Attended karaoke and took a part, dancing etc. Pt. Loud at one point when talking to writer expressing anger at [facility], who he said thinks he is ready for discharge. Pt. Began using loud profane words Clinical research associate redirected. Pt. liable and childlike in manner. Staff will monitor q80min for safety.

## 2011-10-07 MED ORDER — TRAZODONE HCL 100 MG PO TABS
100.0000 mg | ORAL_TABLET | Freq: Every day | ORAL | Status: DC
Start: 2011-10-07 — End: 2011-10-07
  Filled 2011-10-07 (×2): qty 14

## 2011-10-07 MED ORDER — CARBAMAZEPINE ER 200 MG PO TB12
1000.0000 mg | ORAL_TABLET | Freq: Every day | ORAL | Status: DC
  Filled 2011-10-07: qty 35

## 2011-10-07 MED ORDER — PSEUDOEPHEDRINE HCL 30 MG PO TABS
30.0000 mg | ORAL_TABLET | ORAL | Status: AC
  Administered 2011-10-07: 30 mg via ORAL
  Filled 2011-10-07 (×2): qty 1

## 2011-10-07 NOTE — Tx Team (Signed)
Interdisciplinary Treatment Plan Update (Adult)  Date:  10/07/2011  Time Reviewed:  10:59 AM   Progress in Treatment: Attending groups:   Yes   Participating in groups:  Yes Taking medication as prescribed:  Yes Tolerating medication:  Yes Family/Significant othe contact made:  Patient understands diagnosis:  Yes Discussing patient identified problems/goals with staff: Yes Medical problems stabilized or resolved: Yes Denies suicidal/homicidal ideation:Yes Issues/concerns per patient self-inventory:  Other:  New problem(s) identified:  Reason for Continuation of Hospitalization:  Interventions implemented related to continuation of hospitalization:  Additional comments:  Estimated length of stay: Discharge home today  Discharge Plan:  Home with outpatient follow up at [facility] in [place], [place]  New goal(s):  Review of initial/current patient goals per problem list:    1.  Goal(s): Eliminate SI/other thoughts of self harm   Met:  Yes  Target date: d/c  As evidenced by: Patient will no longer endorse SI/other thought self harm  2.  Goal (s): .Reduce depression/anxiety   Met:  Yes  Target date: d/c  As evidenced by:  Patient will rated symptoms at four or below (Deferred due symptoms declining in spite of goal not being met at discharge)  3.  Goal(s): .stabilize on meds   Met:  Yes  Target date: d/c   As evidenced by: Patient will report being stable on medications - symptoms have decreased  4.  Goal(s):  Assist with outpatient follow up  Met:  Yes  Target date: d/c  As evidenced by:  Follow up appointment in place  Attendees: Patient:  Hector [name] 10/07/2011 11:06 AM  Family:     Physician:  Orson Aloe, MD 10/07/2011 10:59 AM   Nursing:   Lamount Cranker, RN 10/07/2011 10:59 AM   CaseManager:  Juline Patch, LCSW 10/07/2011 10:59 AM   Counselor:  Angus Palms, LCSW 10/07/2011 10:59 AM   Other:  Reyes Ivan, LCSWA 10/07/2011 10:59 AM   Other:     Other:     Other:      Scribe for Treatment Team:   Wynn Banker, LCSW,  10/07/2011 10:59 AM

## 2011-10-07 NOTE — Progress Notes (Signed)
10/07/2011         Time: 1415      Group Topic/Focus: The focus of this group is on discussing various styles of communication and communicating assertively using 'I' (feeling) statements.  Participation Level: None  Participation Quality: Resistant  Affect: Irritable  Cognitive: Oriented  Additional Comments: Patient refused to participate in the activity. Patient says he is being discharged today against his will and doesn't feel ready. Patient said "maybe" when asked if he could be safe or not.   Hector [name] 10/07/2011 3:46 PM

## 2011-10-07 NOTE — Progress Notes (Signed)
Pt came to the window to get his medication this morning and stated "I am very angry. Why do I have to take this medicine when I am leaving here today. I am just very angry and when I am angry I have to go and just get away from people". Pt educated about his medication and informed that he did not have to take it unless he chose to. Again Pt stated that he just needed to be left alone and wait until he is discharged. Pt rates his hopelessness and his depression both at a 7. Denies SI and HI presently.

## 2011-10-07 NOTE — Progress Notes (Signed)
[facility] Group Notes:  (Counselor/Nursing/MHT/Case Management/Adjunct)  10/07/2011 11:00am Preventing Relapse  Type of Therapy:  Group Therapy  Participation Level:  Did Not Attend       [name] 10/07/2011  2:28 PM

## 2011-10-07 NOTE — Progress Notes (Signed)
[facility] MD Progress Note  10/07/2011 3:50 PM  Diagnosis:  Axis I: Schizoaffective Disorder and Non compliance with treatment and poor compliance with medications  ADL's:  Intact  Sleep: Good, actually a little groggy in the morning after the shift to all at bedtime dosing of Tegretol.  Appetite:  Good  Suicidal Ideation:  Pt denies any thoughts of suicide. Homicidal Ideation:  Denies adamantly any homicidal thoughts.  Mental Status Examination/Evaluation: Objective:  Appearance: Casual  Eye Contact::  Good  Speech:  Clear and Coherent  Volume:  Normal  Mood:  Euthymic  Affect:  Congruent  Thought Process:  Coherent  Orientation:  Full  Thought Content:  WDL  Suicidal Thoughts:  No  Homicidal Thoughts:  No  Memory:  Immediate;   Good   Judgement:  Good  Insight:  Lacking  Psychomotor Activity:  Normal  Concentration:  Good  Recall:  Good  Akathisia:  No  Handed:  Right  AIMS (if indicated):     Assets:  Communication Skills Desire for Improvement  Sleep:  Number of Hours: 6.25    ROS: Neuro: denies ataxia, headaches, or weakness  GI: denies N/VD/cramps/constipation  MS: denies aches, weakness, or muscle cramps.  ENT: notes nasal congestion requiring him to mouth breath when reclining.  Will prescribe Sudafed and suggest the he buy some Mucinex-D when he gets home.  Vital Signs:Blood pressure 134/79, pulse 106, temperature 97.1 F (36.2 C), temperature source Oral, resp. rate 17, height 6\' 1"  (1.854 m), weight 116.121 kg (256 lb). Current Medications: Current Facility-Administered Medications  Medication Dose Route Frequency Provider Last Rate Last Dose  . acetaminophen (TYLENOL) tablet 650 mg  650 mg Oral Q6H PRN Wonda Cerise, MD      . alum & mag hydroxide-simeth (MAALOX/MYLANTA) 200-200-20 MG/5ML suspension 30 mL  30 mL Oral Q4H PRN Wonda Cerise, MD   30 mL at 10/04/11 1642  . carbamazepine (TEGRETOL XR) 12 hr tablet 1,000 mg  1,000 mg Oral QHS Mike Craze, MD    1,000 mg at 10/06/11 2235  . citalopram (CELEXA) tablet 20 mg  20 mg Oral Daily Sanjuana Kava, NP   20 mg at 10/07/11 0809  . guaifenesin (ROBITUSSIN) 100 MG/5ML syrup 200 mg  200 mg Oral Q4H PRN Cleotis Nipper, MD   200 mg at 10/05/11 2233  . hydrOXYzine (ATARAX/VISTARIL) tablet 50 mg  50 mg Oral QHS PRN Mickeal Skinner, MD   50 mg at 10/03/11 2207  . ibuprofen (ADVIL,MOTRIN) tablet 400 mg  400 mg Oral Q4H PRN Mike Craze, MD   400 mg at 10/05/11 0819  . magnesium hydroxide (MILK OF MAGNESIA) suspension 30 mL  30 mL Oral Daily PRN Wonda Cerise, MD      . pseudoephedrine (SUDAFED) tablet 30 mg  30 mg Oral NOW Mike Craze, MD   30 mg at 10/07/11 1115  . QUEtiapine (SEROQUEL) tablet 100 mg  100 mg Oral QHS,MR X 1 Mike Craze, MD   100 mg at 10/06/11 2238  . sodium chloride (OCEAN) 0.65 % nasal spray 1 spray  1 spray Each Nare PRN Cleotis Nipper, MD   1 spray at 10/06/11 1730  . traZODone (DESYREL) tablet 50 mg  50 mg Oral QHS PRN,MR X 1 Wonda Cerise, MD      . DISCONTD: carbamazepine (TEGRETOL XR) 12 hr tablet 1,000 mg  1,000 mg Oral QHS Mike Craze, MD      . DISCONTD: traZODone (DESYREL) tablet 100  mg  100 mg Oral QHS Mike Craze, MD        Lab Results:  Results for orders placed during the hospital encounter of 10/02/11 (from the past 48 hour(s))  CARBAMAZEPINE LEVEL, TOTAL     Status: Abnormal   Collection Time   10/07/11  6:30 AM      Component Value Range Comment   Carbamazepine Lvl 14.3 (*) 4.0 - 12.0 (ug/mL)     Physical Findings: AIMS:  , ,  ,  ,    CIWA:    COWS:     Treatment Plan Summary: Daily contact with patient to assess and evaluate symptoms and progress in treatment Medication management  Discussion/Plan: Seen in consult room.  Problem: Schizoaffective D/O: Pt notes no hallucinations, moods good on Tegretol.  Shifted to XR form and take all at bed time.  Level after all HS dosing is a little high as expected.  The blood level will lower after being on it for  a month.  Problem: Insomnia: Seroquel 100 mg was helpful.  Problem: Nasal congestion Will prescribe Sudafed for here and make recommendations for after he leaves.  Need for further hospital stay: to optimally adjust medications, learn more coping strategies.  Panhia Karl 10/07/2011, 3:50 PM

## 2011-10-07 NOTE — Progress Notes (Signed)
Patient ID: Hector [name], male   DOB: 09/09/1985, 26 y.o.   MRN: [id number] Pt. denies lethality and A/V/H's, but also states, "I don't know, it might come back."   Pt's mood is angry and affect is congruent: Pt is upset he is not being treated for what he calls "not being able to breathe at night.  Pt. was instructed that he needs to see a medical doctor about his sleep difficulties and if he needs a sleep study, CPAP, etc., it would have to come from a medical practitioner.  Pt. reluctantly stated and indicated he understood. Pt.'s discharge instructions were reviewed in anticipation of his discharge.  Pt. signed his AVS, but refused to continue to compete other discharge procedures at this time.  Will review and have Pt. Sign other papers at discharge. 19:00-- Still waiting for transportation home.
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
## 2011-10-08 NOTE — Progress Notes (Signed)
Patient ID: Hector [name], male   DOB: 1985-07-22, 26 y.o.   MRN: [id number] 10-08-11 discharge note: pt stated he is ready for discharge. He denied any si/hi/av. He got his discharge instructions, scripts, sample medications and his belongings. He stated he understood his discharge instructions. The police came to p/u pt and transport him home.

## 2011-10-11 NOTE — Progress Notes (Signed)
Patient Discharge Instructions:  After Visit Summary (AVS):   Faxed to:  10/10/2011 Psychiatric Admission Assessment Note:   Faxed to:  10/10/2011 Suicide Risk Assessment - Discharge Assessment:   Faxed to:  10/10/2011 Faxed/Sent to the Next Level Care provider:  10/10/2011  Faxed to [facility] @ [phone]  Heloise Purpura, Eduard Clos, 10/11/2011, 10:51 AM

## 2011-10-13 NOTE — Discharge Summary (Signed)
I agree with this D/C Summary.  

## 2014-05-23 DIAGNOSIS — Z21 Asymptomatic human immunodeficiency virus [HIV] infection status: Secondary | ICD-10-CM

## 2014-05-23 DIAGNOSIS — B20 Human immunodeficiency virus [HIV] disease: Secondary | ICD-10-CM

## 2014-05-23 HISTORY — DX: Asymptomatic human immunodeficiency virus (hiv) infection status: Z21

## 2014-05-23 HISTORY — DX: Human immunodeficiency virus (HIV) disease: B20

## 2014-06-15 DIAGNOSIS — R0789 Other chest pain: Secondary | ICD-10-CM | POA: Diagnosis not present

## 2014-06-15 DIAGNOSIS — I10 Essential (primary) hypertension: Secondary | ICD-10-CM | POA: Diagnosis not present

## 2014-06-16 DIAGNOSIS — R197 Diarrhea, unspecified: Secondary | ICD-10-CM | POA: Diagnosis not present

## 2014-06-16 DIAGNOSIS — R1110 Vomiting, unspecified: Secondary | ICD-10-CM | POA: Diagnosis not present

## 2014-06-22 DIAGNOSIS — K5900 Constipation, unspecified: Secondary | ICD-10-CM | POA: Diagnosis not present

## 2014-06-22 DIAGNOSIS — K649 Unspecified hemorrhoids: Secondary | ICD-10-CM | POA: Diagnosis not present

## 2014-06-22 DIAGNOSIS — R109 Unspecified abdominal pain: Secondary | ICD-10-CM | POA: Diagnosis not present

## 2014-11-23 DIAGNOSIS — J00 Acute nasopharyngitis [common cold]: Secondary | ICD-10-CM | POA: Diagnosis not present

## 2014-11-23 DIAGNOSIS — R7309 Other abnormal glucose: Secondary | ICD-10-CM | POA: Diagnosis not present

## 2014-11-23 DIAGNOSIS — J020 Streptococcal pharyngitis: Secondary | ICD-10-CM | POA: Diagnosis not present

## 2014-11-23 DIAGNOSIS — I10 Essential (primary) hypertension: Secondary | ICD-10-CM | POA: Diagnosis not present

## 2014-11-23 DIAGNOSIS — L989 Disorder of the skin and subcutaneous tissue, unspecified: Secondary | ICD-10-CM | POA: Diagnosis not present

## 2014-11-23 DIAGNOSIS — Z21 Asymptomatic human immunodeficiency virus [HIV] infection status: Secondary | ICD-10-CM | POA: Diagnosis not present

## 2014-11-23 DIAGNOSIS — R05 Cough: Secondary | ICD-10-CM | POA: Diagnosis not present

## 2014-11-26 DIAGNOSIS — R05 Cough: Secondary | ICD-10-CM | POA: Diagnosis not present

## 2014-11-26 DIAGNOSIS — R062 Wheezing: Secondary | ICD-10-CM | POA: Diagnosis not present

## 2014-11-26 DIAGNOSIS — R0602 Shortness of breath: Secondary | ICD-10-CM | POA: Diagnosis not present

## 2014-12-31 DIAGNOSIS — S0990XA Unspecified injury of head, initial encounter: Secondary | ICD-10-CM | POA: Diagnosis not present

## 2014-12-31 DIAGNOSIS — S0093XA Contusion of unspecified part of head, initial encounter: Secondary | ICD-10-CM | POA: Diagnosis not present

## 2014-12-31 DIAGNOSIS — S161XXA Strain of muscle, fascia and tendon at neck level, initial encounter: Secondary | ICD-10-CM | POA: Diagnosis not present

## 2014-12-31 DIAGNOSIS — M542 Cervicalgia: Secondary | ICD-10-CM | POA: Diagnosis not present

## 2015-01-06 DIAGNOSIS — G44319 Acute post-traumatic headache, not intractable: Secondary | ICD-10-CM | POA: Diagnosis not present

## 2015-01-06 DIAGNOSIS — F0781 Postconcussional syndrome: Secondary | ICD-10-CM | POA: Diagnosis not present

## 2015-02-20 DIAGNOSIS — I10 Essential (primary) hypertension: Secondary | ICD-10-CM | POA: Diagnosis not present

## 2015-02-20 DIAGNOSIS — R1110 Vomiting, unspecified: Secondary | ICD-10-CM | POA: Diagnosis not present

## 2015-02-20 DIAGNOSIS — R51 Headache: Secondary | ICD-10-CM | POA: Diagnosis not present

## 2015-02-20 DIAGNOSIS — R112 Nausea with vomiting, unspecified: Secondary | ICD-10-CM | POA: Diagnosis not present

## 2015-03-16 DIAGNOSIS — R51 Headache: Secondary | ICD-10-CM | POA: Diagnosis not present

## 2015-03-16 DIAGNOSIS — I10 Essential (primary) hypertension: Secondary | ICD-10-CM | POA: Diagnosis not present

## 2015-03-16 DIAGNOSIS — R42 Dizziness and giddiness: Secondary | ICD-10-CM | POA: Diagnosis not present

## 2015-03-16 DIAGNOSIS — R079 Chest pain, unspecified: Secondary | ICD-10-CM | POA: Diagnosis not present

## 2015-03-18 DIAGNOSIS — R079 Chest pain, unspecified: Secondary | ICD-10-CM | POA: Diagnosis not present

## 2015-03-18 DIAGNOSIS — I10 Essential (primary) hypertension: Secondary | ICD-10-CM | POA: Diagnosis not present

## 2015-03-18 DIAGNOSIS — R05 Cough: Secondary | ICD-10-CM | POA: Diagnosis not present

## 2015-03-18 DIAGNOSIS — B20 Human immunodeficiency virus [HIV] disease: Secondary | ICD-10-CM | POA: Diagnosis not present

## 2015-03-18 DIAGNOSIS — R072 Precordial pain: Secondary | ICD-10-CM | POA: Diagnosis not present

## 2015-05-11 DIAGNOSIS — F319 Bipolar disorder, unspecified: Secondary | ICD-10-CM | POA: Diagnosis not present

## 2015-05-11 DIAGNOSIS — S199XXA Unspecified injury of neck, initial encounter: Secondary | ICD-10-CM | POA: Diagnosis not present

## 2015-05-11 DIAGNOSIS — Z888 Allergy status to other drugs, medicaments and biological substances status: Secondary | ICD-10-CM | POA: Diagnosis not present

## 2015-05-11 DIAGNOSIS — I10 Essential (primary) hypertension: Secondary | ICD-10-CM | POA: Diagnosis not present

## 2015-05-11 DIAGNOSIS — R1310 Dysphagia, unspecified: Secondary | ICD-10-CM | POA: Diagnosis not present

## 2015-05-11 DIAGNOSIS — R4701 Aphasia: Secondary | ICD-10-CM | POA: Diagnosis not present

## 2015-05-11 DIAGNOSIS — R4182 Altered mental status, unspecified: Secondary | ICD-10-CM | POA: Diagnosis not present

## 2015-05-11 DIAGNOSIS — S3991XA Unspecified injury of abdomen, initial encounter: Secondary | ICD-10-CM | POA: Diagnosis not present

## 2015-05-11 DIAGNOSIS — S0990XA Unspecified injury of head, initial encounter: Secondary | ICD-10-CM | POA: Diagnosis not present

## 2015-05-11 DIAGNOSIS — Z041 Encounter for examination and observation following transport accident: Secondary | ICD-10-CM | POA: Diagnosis not present

## 2015-05-11 DIAGNOSIS — E119 Type 2 diabetes mellitus without complications: Secondary | ICD-10-CM | POA: Diagnosis not present

## 2015-05-11 DIAGNOSIS — F332 Major depressive disorder, recurrent severe without psychotic features: Secondary | ICD-10-CM | POA: Diagnosis not present

## 2015-05-11 DIAGNOSIS — R599 Enlarged lymph nodes, unspecified: Secondary | ICD-10-CM | POA: Diagnosis not present

## 2015-05-11 DIAGNOSIS — S299XXA Unspecified injury of thorax, initial encounter: Secondary | ICD-10-CM | POA: Diagnosis not present

## 2015-05-12 DIAGNOSIS — S4992XA Unspecified injury of left shoulder and upper arm, initial encounter: Secondary | ICD-10-CM | POA: Diagnosis not present

## 2015-05-12 DIAGNOSIS — S161XXA Strain of muscle, fascia and tendon at neck level, initial encounter: Secondary | ICD-10-CM | POA: Diagnosis present

## 2015-05-12 DIAGNOSIS — M25512 Pain in left shoulder: Secondary | ICD-10-CM | POA: Diagnosis not present

## 2015-05-12 DIAGNOSIS — M542 Cervicalgia: Secondary | ICD-10-CM | POA: Diagnosis not present

## 2015-05-12 DIAGNOSIS — Z041 Encounter for examination and observation following transport accident: Secondary | ICD-10-CM | POA: Diagnosis present

## 2015-05-12 DIAGNOSIS — R4182 Altered mental status, unspecified: Secondary | ICD-10-CM | POA: Diagnosis not present

## 2015-05-12 DIAGNOSIS — I10 Essential (primary) hypertension: Secondary | ICD-10-CM | POA: Diagnosis present

## 2015-05-12 DIAGNOSIS — M62838 Other muscle spasm: Secondary | ICD-10-CM | POA: Diagnosis not present

## 2015-05-12 DIAGNOSIS — E119 Type 2 diabetes mellitus without complications: Secondary | ICD-10-CM | POA: Diagnosis present

## 2015-05-12 DIAGNOSIS — F333 Major depressive disorder, recurrent, severe with psychotic symptoms: Secondary | ICD-10-CM | POA: Diagnosis not present

## 2015-05-12 DIAGNOSIS — F332 Major depressive disorder, recurrent severe without psychotic features: Secondary | ICD-10-CM | POA: Diagnosis present

## 2015-05-12 DIAGNOSIS — F319 Bipolar disorder, unspecified: Secondary | ICD-10-CM | POA: Diagnosis not present

## 2015-05-12 DIAGNOSIS — Z888 Allergy status to other drugs, medicaments and biological substances status: Secondary | ICD-10-CM | POA: Diagnosis not present

## 2015-08-30 DIAGNOSIS — E876 Hypokalemia: Secondary | ICD-10-CM | POA: Diagnosis not present

## 2015-08-30 DIAGNOSIS — E119 Type 2 diabetes mellitus without complications: Secondary | ICD-10-CM | POA: Diagnosis not present

## 2015-08-30 DIAGNOSIS — B20 Human immunodeficiency virus [HIV] disease: Secondary | ICD-10-CM | POA: Diagnosis not present

## 2015-08-30 DIAGNOSIS — G8929 Other chronic pain: Secondary | ICD-10-CM | POA: Diagnosis not present

## 2015-08-30 DIAGNOSIS — Z7982 Long term (current) use of aspirin: Secondary | ICD-10-CM | POA: Diagnosis not present

## 2015-08-30 DIAGNOSIS — Z8249 Family history of ischemic heart disease and other diseases of the circulatory system: Secondary | ICD-10-CM | POA: Diagnosis not present

## 2015-08-30 DIAGNOSIS — Z79899 Other long term (current) drug therapy: Secondary | ICD-10-CM | POA: Diagnosis not present

## 2015-08-30 DIAGNOSIS — R0789 Other chest pain: Secondary | ICD-10-CM | POA: Diagnosis not present

## 2015-08-30 DIAGNOSIS — M549 Dorsalgia, unspecified: Secondary | ICD-10-CM | POA: Diagnosis not present

## 2015-08-30 DIAGNOSIS — R590 Localized enlarged lymph nodes: Secondary | ICD-10-CM | POA: Diagnosis not present

## 2015-08-30 DIAGNOSIS — E871 Hypo-osmolality and hyponatremia: Secondary | ICD-10-CM | POA: Diagnosis not present

## 2015-08-30 DIAGNOSIS — I10 Essential (primary) hypertension: Secondary | ICD-10-CM | POA: Diagnosis not present

## 2015-08-30 DIAGNOSIS — R079 Chest pain, unspecified: Secondary | ICD-10-CM | POA: Diagnosis not present

## 2015-08-30 DIAGNOSIS — F209 Schizophrenia, unspecified: Secondary | ICD-10-CM | POA: Diagnosis not present

## 2015-08-30 DIAGNOSIS — Z21 Asymptomatic human immunodeficiency virus [HIV] infection status: Secondary | ICD-10-CM | POA: Diagnosis not present

## 2015-08-30 DIAGNOSIS — R112 Nausea with vomiting, unspecified: Secondary | ICD-10-CM | POA: Diagnosis not present

## 2015-08-30 DIAGNOSIS — Z794 Long term (current) use of insulin: Secondary | ICD-10-CM | POA: Diagnosis not present

## 2015-08-31 DIAGNOSIS — R079 Chest pain, unspecified: Secondary | ICD-10-CM | POA: Diagnosis not present

## 2015-09-06 DIAGNOSIS — S40012A Contusion of left shoulder, initial encounter: Secondary | ICD-10-CM | POA: Diagnosis not present

## 2015-09-06 DIAGNOSIS — S29019A Strain of muscle and tendon of unspecified wall of thorax, initial encounter: Secondary | ICD-10-CM | POA: Diagnosis not present

## 2015-09-15 DIAGNOSIS — R079 Chest pain, unspecified: Secondary | ICD-10-CM | POA: Diagnosis not present

## 2015-09-16 DIAGNOSIS — R079 Chest pain, unspecified: Secondary | ICD-10-CM | POA: Diagnosis not present

## 2015-10-18 DIAGNOSIS — H1032 Unspecified acute conjunctivitis, left eye: Secondary | ICD-10-CM | POA: Diagnosis not present

## 2015-10-18 DIAGNOSIS — Z21 Asymptomatic human immunodeficiency virus [HIV] infection status: Secondary | ICD-10-CM | POA: Diagnosis not present

## 2015-10-18 DIAGNOSIS — I252 Old myocardial infarction: Secondary | ICD-10-CM | POA: Diagnosis not present

## 2015-11-15 DIAGNOSIS — A530 Latent syphilis, unspecified as early or late: Secondary | ICD-10-CM | POA: Diagnosis present

## 2015-11-15 DIAGNOSIS — Z21 Asymptomatic human immunodeficiency virus [HIV] infection status: Secondary | ICD-10-CM | POA: Diagnosis not present

## 2015-11-15 DIAGNOSIS — Z86711 Personal history of pulmonary embolism: Secondary | ICD-10-CM | POA: Diagnosis not present

## 2015-11-15 DIAGNOSIS — R591 Generalized enlarged lymph nodes: Secondary | ICD-10-CM | POA: Diagnosis not present

## 2015-11-15 DIAGNOSIS — I10 Essential (primary) hypertension: Secondary | ICD-10-CM | POA: Diagnosis present

## 2015-11-15 DIAGNOSIS — I2699 Other pulmonary embolism without acute cor pulmonale: Secondary | ICD-10-CM | POA: Diagnosis not present

## 2015-11-15 DIAGNOSIS — R51 Headache: Secondary | ICD-10-CM | POA: Diagnosis not present

## 2015-11-15 DIAGNOSIS — I252 Old myocardial infarction: Secondary | ICD-10-CM | POA: Diagnosis not present

## 2015-11-15 DIAGNOSIS — R531 Weakness: Secondary | ICD-10-CM | POA: Diagnosis not present

## 2015-11-15 DIAGNOSIS — A539 Syphilis, unspecified: Secondary | ICD-10-CM | POA: Diagnosis not present

## 2015-11-15 DIAGNOSIS — B20 Human immunodeficiency virus [HIV] disease: Secondary | ICD-10-CM | POA: Diagnosis not present

## 2015-11-15 DIAGNOSIS — R590 Localized enlarged lymph nodes: Secondary | ICD-10-CM | POA: Diagnosis not present

## 2015-11-15 DIAGNOSIS — Z79899 Other long term (current) drug therapy: Secondary | ICD-10-CM | POA: Diagnosis not present

## 2015-11-15 DIAGNOSIS — E876 Hypokalemia: Secondary | ICD-10-CM | POA: Diagnosis present

## 2015-11-15 DIAGNOSIS — R0789 Other chest pain: Secondary | ICD-10-CM | POA: Diagnosis not present

## 2015-11-15 DIAGNOSIS — R7303 Prediabetes: Secondary | ICD-10-CM | POA: Diagnosis present

## 2015-11-15 DIAGNOSIS — R079 Chest pain, unspecified: Secondary | ICD-10-CM | POA: Diagnosis not present

## 2015-11-15 DIAGNOSIS — F259 Schizoaffective disorder, unspecified: Secondary | ICD-10-CM | POA: Diagnosis present

## 2015-11-15 DIAGNOSIS — A568 Sexually transmitted chlamydial infection of other sites: Secondary | ICD-10-CM | POA: Diagnosis present

## 2015-11-15 DIAGNOSIS — F329 Major depressive disorder, single episode, unspecified: Secondary | ICD-10-CM | POA: Diagnosis present

## 2015-11-15 DIAGNOSIS — M5127 Other intervertebral disc displacement, lumbosacral region: Secondary | ICD-10-CM | POA: Diagnosis not present

## 2015-11-15 DIAGNOSIS — R200 Anesthesia of skin: Secondary | ICD-10-CM | POA: Diagnosis not present

## 2015-11-15 DIAGNOSIS — M549 Dorsalgia, unspecified: Secondary | ICD-10-CM | POA: Diagnosis not present

## 2015-12-29 DIAGNOSIS — R509 Fever, unspecified: Secondary | ICD-10-CM | POA: Diagnosis not present

## 2015-12-29 DIAGNOSIS — R0602 Shortness of breath: Secondary | ICD-10-CM | POA: Diagnosis not present

## 2015-12-29 DIAGNOSIS — J209 Acute bronchitis, unspecified: Secondary | ICD-10-CM | POA: Diagnosis not present

## 2016-02-17 DIAGNOSIS — G8911 Acute pain due to trauma: Secondary | ICD-10-CM | POA: Diagnosis not present

## 2016-02-17 DIAGNOSIS — E669 Obesity, unspecified: Secondary | ICD-10-CM | POA: Diagnosis not present

## 2016-02-17 DIAGNOSIS — Z6833 Body mass index (BMI) 33.0-33.9, adult: Secondary | ICD-10-CM | POA: Diagnosis not present

## 2016-02-17 DIAGNOSIS — S4992XA Unspecified injury of left shoulder and upper arm, initial encounter: Secondary | ICD-10-CM | POA: Diagnosis not present

## 2016-02-17 DIAGNOSIS — R21 Rash and other nonspecific skin eruption: Secondary | ICD-10-CM | POA: Diagnosis not present

## 2016-02-17 DIAGNOSIS — Z79899 Other long term (current) drug therapy: Secondary | ICD-10-CM | POA: Diagnosis not present

## 2016-02-17 DIAGNOSIS — M25712 Osteophyte, left shoulder: Secondary | ICD-10-CM | POA: Diagnosis not present

## 2016-02-17 DIAGNOSIS — M25512 Pain in left shoulder: Secondary | ICD-10-CM | POA: Diagnosis not present

## 2016-02-19 DIAGNOSIS — R21 Rash and other nonspecific skin eruption: Secondary | ICD-10-CM | POA: Diagnosis not present

## 2016-02-19 DIAGNOSIS — L539 Erythematous condition, unspecified: Secondary | ICD-10-CM | POA: Diagnosis not present

## 2016-02-19 DIAGNOSIS — Z79899 Other long term (current) drug therapy: Secondary | ICD-10-CM | POA: Diagnosis not present

## 2016-04-02 DIAGNOSIS — J111 Influenza due to unidentified influenza virus with other respiratory manifestations: Secondary | ICD-10-CM | POA: Diagnosis not present

## 2016-04-02 DIAGNOSIS — R079 Chest pain, unspecified: Secondary | ICD-10-CM | POA: Diagnosis not present

## 2016-04-02 DIAGNOSIS — R42 Dizziness and giddiness: Secondary | ICD-10-CM | POA: Diagnosis not present

## 2016-06-01 ENCOUNTER — Telehealth: Payer: Self-pay | Admitting: Lab

## 2016-06-01 NOTE — Telephone Encounter (Signed)
I tried to reach patient but the telephone rang and then [phone]

## 2016-06-28 ENCOUNTER — Telehealth: Payer: Self-pay | Admitting: Lab

## 2016-06-28 NOTE — Telephone Encounter (Signed)
06/01/16-When I called the patient there was no answer.  The number would ring a few times and then stop.  Today patient called the office and updated his phone number with front desk.  I returned call but had to leave a message.

## 2016-07-13 ENCOUNTER — Other Ambulatory Visit: Payer: Medicare Other

## 2016-07-13 ENCOUNTER — Other Ambulatory Visit (HOSPITAL_COMMUNITY)
Admission: RE | Admit: 2016-07-13 | Discharge: 2016-07-13 | Disposition: A | Discharge status: Home / Self Care | Payer: Medicare Other | Source: Ambulatory Visit | Attending: Infectious Diseases | Admitting: Infectious Diseases

## 2016-07-13 DIAGNOSIS — Z113 Encounter for screening for infections with a predominantly sexual mode of transmission: Secondary | ICD-10-CM | POA: Insufficient documentation

## 2016-07-13 DIAGNOSIS — B20 Human immunodeficiency virus [HIV] disease: Secondary | ICD-10-CM | POA: Diagnosis not present

## 2016-07-13 LAB — CBC WITH DIFFERENTIAL/PLATELET
BASOS ABS: 63 {cells}/uL (ref 0–200)
Basophils Relative: 1 %
EOS ABS: 126 {cells}/uL (ref 15–500)
Eosinophils Relative: 2 %
HEMATOCRIT: 38.5 % (ref 38.5–50.0)
Hemoglobin: 13 g/dL — ABNORMAL LOW (ref 13.2–17.1)
LYMPHS PCT: 31 %
Lymphs Abs: 1953 cells/uL (ref 850–3900)
MCH: 28.9 pg (ref 27.0–33.0)
MCHC: 33.8 g/dL (ref 32.0–36.0)
MCV: 85.6 fL (ref 80.0–100.0)
MONO ABS: 441 {cells}/uL (ref 200–950)
MPV: 9.8 fL (ref 7.5–12.5)
Monocytes Relative: 7 %
NEUTROS PCT: 59 %
Neutro Abs: 3717 cells/uL (ref 1500–7800)
Platelets: 239 10*3/uL (ref 140–400)
RBC: 4.5 MIL/uL (ref 4.20–5.80)
RDW: 14.3 % (ref 11.0–15.0)
WBC: 6.3 10*3/uL (ref 3.8–10.8)

## 2016-07-13 LAB — COMPLETE METABOLIC PANEL WITH GFR
ALT: 32 U/L (ref 9–46)
AST: 21 U/L (ref 10–40)
Albumin: 3.7 g/dL (ref 3.6–5.1)
Alkaline Phosphatase: 69 U/L (ref 40–115)
BUN: 11 mg/dL (ref 7–25)
CALCIUM: 9.2 mg/dL (ref 8.6–10.3)
CHLORIDE: 105 mmol/L (ref 98–110)
CO2: 25 mmol/L (ref 20–31)
CREATININE: 0.79 mg/dL (ref 0.60–1.35)
Glucose, Bld: 89 mg/dL (ref 65–99)
Potassium: 3.9 mmol/L (ref 3.5–5.3)
Sodium: 136 mmol/L (ref 135–146)
Total Bilirubin: 0.3 mg/dL (ref 0.2–1.2)
Total Protein: 9.4 g/dL — ABNORMAL HIGH (ref 6.1–8.1)

## 2016-07-14 LAB — HEPATITIS C ANTIBODY: HCV AB: NEGATIVE

## 2016-07-14 LAB — HEPATITIS B CORE ANTIBODY, TOTAL: Hep B Core Total Ab: NONREACTIVE

## 2016-07-14 LAB — URINE CYTOLOGY ANCILLARY ONLY
Chlamydia: NEGATIVE
Neisseria Gonorrhea: NEGATIVE

## 2016-07-14 LAB — HEPATITIS A ANTIBODY, TOTAL: Hep A Total Ab: NONREACTIVE

## 2016-07-14 LAB — HEPATITIS B SURFACE ANTIGEN: HEP B S AG: NEGATIVE

## 2016-07-14 LAB — RPR

## 2016-07-14 LAB — HEPATITIS B SURFACE ANTIBODY,QUALITATIVE: Hep B S Ab: POSITIVE — AB

## 2016-07-15 LAB — HIV-1 RNA,QN PCR W/REFLEX GENOTYPE
HIV-1 RNA, QN PCR: 57800 {copies}/mL — AB
HIV-1 RNA, QN PCR: 4.76 Log cps/mL — ABNORMAL HIGH

## 2016-07-15 LAB — QUANTIFERON TB GOLD ASSAY (BLOOD)
Interferon Gamma Release Assay: NEGATIVE
MITOGEN-NIL SO: 5.79 [IU]/mL
QUANTIFERON TB AG MINUS NIL: 0.03 [IU]/mL
Quantiferon Nil Value: 0.16 IU/mL

## 2016-07-18 ENCOUNTER — Encounter: Payer: Self-pay | Admitting: Infectious Diseases

## 2016-07-18 DIAGNOSIS — Z789 Other specified health status: Secondary | ICD-10-CM | POA: Insufficient documentation

## 2016-07-20 ENCOUNTER — Encounter: Payer: Self-pay | Admitting: Infectious Diseases

## 2016-07-20 DIAGNOSIS — Z21 Asymptomatic human immunodeficiency virus [HIV] infection status: Secondary | ICD-10-CM | POA: Insufficient documentation

## 2016-07-20 DIAGNOSIS — B20 Human immunodeficiency virus [HIV] disease: Secondary | ICD-10-CM | POA: Insufficient documentation

## 2016-07-20 LAB — HIV-1 GENOTYPR PLUS

## 2016-07-20 LAB — HLA B*5701: HLA-B*5701 w/rflx HLA-B High: NEGATIVE

## 2016-07-27 ENCOUNTER — Encounter: Payer: Self-pay | Admitting: Infectious Diseases

## 2016-07-27 ENCOUNTER — Ambulatory Visit (INDEPENDENT_AMBULATORY_CARE_PROVIDER_SITE_OTHER): Payer: Medicare Other | Admitting: Infectious Diseases

## 2016-07-27 VITALS — BP 129/68 | HR 70 | Temp 98.0°F | Ht 74.0 in | Wt 265.0 lb

## 2016-07-27 DIAGNOSIS — F250 Schizoaffective disorder, bipolar type: Secondary | ICD-10-CM | POA: Diagnosis not present

## 2016-07-27 DIAGNOSIS — Z23 Encounter for immunization: Secondary | ICD-10-CM | POA: Diagnosis not present

## 2016-07-27 DIAGNOSIS — Z21 Asymptomatic human immunodeficiency virus [HIV] infection status: Secondary | ICD-10-CM

## 2016-07-27 DIAGNOSIS — B20 Human immunodeficiency virus [HIV] disease: Secondary | ICD-10-CM | POA: Diagnosis not present

## 2016-07-27 MED ORDER — ABACAVIR-DOLUTEGRAVIR-LAMIVUD 600-50-300 MG PO TABS: 1.0000 | ORAL_TABLET | Freq: Every day | ORAL | 3 refills | Status: DC

## 2016-07-27 MED ORDER — ABACAVIR-DOLUTEGRAVIR-LAMIVUD 600-50-300 MG PO TABS: 1.0000 | ORAL_TABLET | Freq: Every day | ORAL | Status: DC

## 2016-07-27 MED FILL — TRIUMEQ 600-50-300 MG TABS: 600-50-300 | 30 days supply | Qty: 30 | Fill #0

## 2016-07-27 NOTE — Addendum Note (Signed)
Addended by: Linnell FullingBRANNON, LATOYA N on: 07/27/2016 11:40 AM   Modules accepted: Orders

## 2016-07-27 NOTE — Assessment & Plan Note (Addendum)
Will change him to [facility]- tirumeq Will give him mening vax Has gotten flu shot.  Has condoms.  Will start Hep A series Meeting with pharm Will see him back in 3 months.

## 2016-07-27 NOTE — Progress Notes (Signed)
   Subjective:    Patient ID: Hector [name], male    DOB: 07/14/1985, 31 y.o.   MRN: [id number]  HPI 31 yo M first dx with HIV 2016. [place] prev [place] cared [place] in [place]. Was prev on DRVr/TRV (his only ART). No problems with prev meds. He has been off for 3 months.  Has been feeling well, exercising.  Also hx of borderline BP, off rx.  On disability for learning disability.  Is able to talk to his sister about his health. Family is in [place].   The past medical history, family history and social history were reviewed/updated in EPIC  Review of Systems  Constitutional: Positive for appetite change. Negative for unexpected weight change.  Respiratory: Negative for cough and shortness of breath.   Cardiovascular: Positive for chest pain.  Gastrointestinal: Negative for constipation and diarrhea.  Genitourinary: Negative for difficulty urinating.  Musculoskeletal: Negative for arthralgias, joint swelling and myalgias.  Neurological: Negative for headaches.  has tranisent CP- a couple of seconds. Mid-sternal. No radiation. No SOb, no diaphoresis.  Please see HPI. 12 point ROS o/w (-)      Objective:   Physical Exam  Constitutional: He is oriented to person, place, and time. He appears well-developed and well-nourished.  HENT:  Mouth/Throat: No oropharyngeal exudate.  Eyes: EOM are normal. Pupils are equal, round, and reactive to light.  Neck: Neck supple.  Cardiovascular: Normal rate, regular rhythm and normal heart sounds.   Pulmonary/Chest: Effort normal and breath sounds normal.  Abdominal: Soft. Bowel sounds are normal. There is no tenderness. There is no rebound.  Musculoskeletal: He exhibits no edema.  Lymphadenopathy:    He has no cervical adenopathy.  Neurological: He is alert and oriented to person, place, and time. Coordination normal.  Psychiatric: He has a normal mood and affect. His behavior is normal.      Assessment & Plan:

## 2016-07-27 NOTE — Progress Notes (Signed)
HPI: Hector [name] is a 31 y.o. male who presents to the [facility] clinic today as a new patient to see Dr. Johnnye [name] for his HIV infection.  He has been diagnosed since 2016.  Allergies: Allergies  Allergen Reactions  . Tomato Anaphylaxis and Rash  . Risperdal [Risperidone] Nausea And Vomiting    Past Medical History: Past Medical History:  Diagnosis Date  . Depression   . History of fractured vertebra april 8th Diagnosed   [place] out of a tree while climbing it.  . Human immunodeficiency virus I infection ([place]) 2016  . Hypertension   . Mental disorder     Social History: Social History   Social History  . Marital status: Single    Spouse name: N/A  . Number of children: N/A  . Years of education: N/A   Social History Main Topics  . Smoking status: Never Smoker  . Smokeless tobacco: Never Used  . Alcohol use No  . Drug use: No  . Sexual activity: No     Comment: declined    Other Topics Concern  . None   Social History Narrative  . None    Current Regimen: None right now - Previously Prezista/norvir + Truvada  Labs: Hep B S Ab (no units)  Date Value  07/13/2016 POS (A)   Hepatitis B Surface Ag (no units)  Date Value  07/13/2016 NEGATIVE   HCV Ab (no units)  Date Value  07/13/2016 NEGATIVE    CrCl: Estimated Creatinine Clearance: 186 mL/min (by C-G formula based on SCr of 0.79 mg/dL).  Lipids: No results found for: CHOL, TRIG, HDL, CHOLHDL, VLDL, LDLCALC  Assessment: Hector [name] is here today as a new HIV patient in our clinic.  He has been off of medications for ~3 months and was previously on Prezista/norvir + Truvada. He has a few HIV mutations and his genotype is listed below:  HIV Genotype Composite Data Genotype Dates  Mutations in Bold impact drug susceptibility RT Mutations A98G, K103N  PI Mutations K10IV  Integrase Mutations    Interpretation of Genotype Data per [employer] HIV Database  Nucleoside RTIs  Abacavir - susceptible Zidovudine -  susceptible Emtricitabine - susceptible Lamivudine - susceptible Tenofovir - susceptible   Non-Nucleoside RTIs  Efavirenz - high level resistance Nevirapine - high level resistance Etravirine - low level resistance Rilpivirine - low level resistance   I discussed different treatment options with Dr. Johnnye [name], and we decided to start him on Triumeq.  I met with him today and explained the medication to him, including how to take it (with or without food), around the same time every day, and not to miss any doses.  He is anxious to get back on medications.  I will follow up with him in ~3 weeks for side effect monitoring and adherence counseling. I sent his Rx to [facility].   Plans: - Start Triumeq 1 tablet PO daily - Rx sent to [facility] - F/u with [facility] pharmacy 3/28 at [place]. K[place]iser, PharmD, [place] [facility] 07/27/2016, 11:22 AM

## 2016-07-27 NOTE — Addendum Note (Signed)
Addended by: Aggie CosierKUPPELWEISER, CASSIE L on: 07/27/2016 11:29 AM   Modules accepted: Orders

## 2016-07-27 NOTE — Assessment & Plan Note (Signed)
He is off all meds States he feels well Will watch to see his needs, re-assess.

## 2016-08-17 ENCOUNTER — Ambulatory Visit: Payer: Self-pay

## 2016-08-22 ENCOUNTER — Encounter: Payer: Self-pay | Admitting: Licensed Clinical Social Worker

## 2016-08-29 MED FILL — TRIUMEQ 600-50-300 MG TABS: 600-50-300 | 30 days supply | Qty: 30 | Fill #1

## 2016-09-15 ENCOUNTER — Emergency Department (HOSPITAL_COMMUNITY): Payer: Medicare Other

## 2016-09-15 ENCOUNTER — Encounter (HOSPITAL_COMMUNITY): Payer: Self-pay | Admitting: Nurse Practitioner

## 2016-09-15 ENCOUNTER — Emergency Department (HOSPITAL_COMMUNITY)
Admission: EM | Admit: 2016-09-15 | Discharge: 2016-09-15 | Disposition: A | Discharge status: Home / Self Care | Payer: Medicare Other | Source: Home / Self Care | Attending: Emergency Medicine | Admitting: Emergency Medicine

## 2016-09-15 DIAGNOSIS — F250 Schizoaffective disorder, bipolar type: Secondary | ICD-10-CM | POA: Diagnosis not present

## 2016-09-15 DIAGNOSIS — I252 Old myocardial infarction: Secondary | ICD-10-CM | POA: Insufficient documentation

## 2016-09-15 DIAGNOSIS — R072 Precordial pain: Secondary | ICD-10-CM

## 2016-09-15 DIAGNOSIS — E869 Volume depletion, unspecified: Secondary | ICD-10-CM | POA: Diagnosis not present

## 2016-09-15 DIAGNOSIS — J181 Lobar pneumonia, unspecified organism: Secondary | ICD-10-CM | POA: Diagnosis not present

## 2016-09-15 DIAGNOSIS — I10 Essential (primary) hypertension: Secondary | ICD-10-CM

## 2016-09-15 DIAGNOSIS — R0602 Shortness of breath: Secondary | ICD-10-CM | POA: Diagnosis not present

## 2016-09-15 DIAGNOSIS — R0789 Other chest pain: Secondary | ICD-10-CM | POA: Diagnosis not present

## 2016-09-15 DIAGNOSIS — R0600 Dyspnea, unspecified: Secondary | ICD-10-CM | POA: Diagnosis not present

## 2016-09-15 DIAGNOSIS — R079 Chest pain, unspecified: Secondary | ICD-10-CM

## 2016-09-15 DIAGNOSIS — A084 Viral intestinal infection, unspecified: Secondary | ICD-10-CM | POA: Diagnosis not present

## 2016-09-15 DIAGNOSIS — B20 Human immunodeficiency virus [HIV] disease: Secondary | ICD-10-CM | POA: Diagnosis not present

## 2016-09-15 HISTORY — DX: Acute myocardial infarction, unspecified: I21.9

## 2016-09-15 LAB — URINALYSIS, ROUTINE W REFLEX MICROSCOPIC
BACTERIA UA: NONE SEEN
Bilirubin Urine: NEGATIVE
Glucose, UA: NEGATIVE mg/dL
Ketones, ur: NEGATIVE mg/dL
Leukocytes, UA: NEGATIVE
Nitrite: NEGATIVE
Protein, ur: NEGATIVE mg/dL
SPECIFIC GRAVITY, URINE: 1.017 (ref 1.005–1.030)
SQUAMOUS EPITHELIAL / LPF: NONE SEEN
pH: 5 (ref 5.0–8.0)

## 2016-09-15 LAB — I-STAT TROPONIN, ED: Troponin i, poc: 0 ng/mL (ref 0.00–0.08)

## 2016-09-15 LAB — BASIC METABOLIC PANEL
ANION GAP: 8 (ref 5–15)
BUN: 8 mg/dL (ref 6–20)
CHLORIDE: 101 mmol/L (ref 101–111)
CO2: 27 mmol/L (ref 22–32)
Calcium: 9.5 mg/dL (ref 8.9–10.3)
Creatinine, Ser: 0.93 mg/dL (ref 0.61–1.24)
GFR calc Af Amer: >60 mL/min (ref >60)
GFR calc non Af Amer: >60 mL/min (ref >60)
Glucose, Bld: 95 mg/dL (ref 65–99)
POTASSIUM: 3.6 mmol/L (ref 3.5–5.1)
Sodium: 136 mmol/L (ref 135–145)

## 2016-09-15 LAB — CBC
HEMATOCRIT: 40.6 % (ref 39.0–52.0)
HEMOGLOBIN: 13.6 g/dL (ref 13.0–17.0)
MCH: 28.4 pg (ref 26.0–34.0)
MCHC: 33.5 g/dL (ref 30.0–36.0)
MCV: 84.8 fL (ref 78.0–100.0)
Platelets: 199 10*3/uL (ref 150–400)
RBC: 4.79 MIL/uL (ref 4.22–5.81)
RDW: 13.6 % (ref 11.5–15.5)
WBC: 12.1 10*3/uL — ABNORMAL HIGH (ref 4.0–10.5)

## 2016-09-15 LAB — I-STAT CG4 LACTIC ACID, ED
LACTIC ACID, VENOUS: 0.87 mmol/L (ref 0.5–1.9)
Lactic Acid, Venous: 1.09 mmol/L (ref 0.5–1.9)

## 2016-09-15 MED ORDER — MORPHINE SULFATE (PF) 4 MG/ML IV SOLN
4.0000 mg | Freq: Once | INTRAVENOUS | Status: AC
  Administered 2016-09-15: 4 mg via INTRAVENOUS
  Filled 2016-09-15: qty 1

## 2016-09-15 MED ORDER — ACETAMINOPHEN 325 MG PO TABS
650.0000 mg | ORAL_TABLET | Freq: Once | ORAL | Status: AC
  Administered 2016-09-15: 650 mg via ORAL
  Filled 2016-09-15: qty 2

## 2016-09-15 NOTE — ED Notes (Signed)
Pt knows he needs to urinate, he said he doesn't have to go right now

## 2016-09-15 NOTE — ED Provider Notes (Signed)
[facility]-EMERGENCY DEPT Provider Note   CSN: [id number] Arrival date & time: 09/15/16  1552     History   Chief Complaint Chief Complaint  Patient presents with  . Chest Pain    HPI Hector [name] is a 31 y.o. male.  Patient presented with chest pain today patient complains of aches on the right side of his chest   The history is provided by the patient.  Chest Pain   This is a new problem. The current episode started 1 to 2 hours ago. The problem occurs constantly. The problem has not changed since onset.The pain is associated with rest. The pain is present in the lateral region and substernal region. The pain is at a severity of 3/10. The pain is moderate. The quality of the pain is described as dull. Pertinent negatives include no abdominal pain, no back pain, no cough and no headaches.  Pertinent negatives for past medical history include no seizures.    Past Medical History:  Diagnosis Date  . Depression   . History of fractured vertebra april 8th Diagnosed   [place] Se[place] out of a tree while climbing it.  . Human immunodeficiency virus I infection (HCC) 2016  . Hypertension   . Mental disorder   . Myocardial infarction [facility])     Patient Active Problem List   Diagnosis Date Noted  . Human immunodeficiency virus I infection (HCC) 07/20/2016  . Hepatitis B immune 07/18/2016  . Non-compliance with treatment 10/06/2011  . Insomnia related to another mental disorder 10/04/2011  . Schizoaffective disorder, bipolar type (HCC) 10/03/2011    Class: Acute    History reviewed. No pertinent surgical history.     Home Medications    Prior to Admission medications   Medication Sig Start Date End Date Taking? Authorizing Provider  abacavir-dolutegravir-lamiVUDine (TRIUMEQ) 600-50-300 MG tablet Take 1 tablet by mouth daily. 07/27/16   Ginnie Smart, MD    Family History Family History  Problem Relation Age of Onset  . CAD Mother 93    MI    Social History Social History    Substance Use Topics  . Smoking status: Never Smoker  . Smokeless tobacco: Never Used  . Alcohol use No     Allergies   Tomato; Risperdal [risperidone]; and Aspirin   Review of Systems Review of Systems  Constitutional: Negative for appetite change and fatigue.  HENT: Negative for congestion, ear discharge and sinus pressure.   Eyes: Negative for discharge.  Respiratory: Negative for cough.   Cardiovascular: Positive for chest pain.  Gastrointestinal: Negative for abdominal pain and diarrhea.  Genitourinary: Negative for frequency and hematuria.  Musculoskeletal: Negative for back pain.  Skin: Negative for rash.  Neurological: Negative for seizures and headaches.  Psychiatric/Behavioral: Negative for hallucinations.     Physical Exam Updated Vital Signs BP (!) 142/98   Pulse 92   Temp (!) 101.5 F (38.6 C) (Oral)   Resp 17   SpO2 99%   Physical Exam  Constitutional: He is oriented to person, place, and time. He appears well-developed.  HENT:  Head: Normocephalic.  Eyes: Conjunctivae and EOM are normal. No scleral icterus.  Neck: Neck supple. No thyromegaly present.  Cardiovascular: Normal rate and regular rhythm.  Exam reveals no gallop and no friction rub.   No murmur heard. Pulmonary/Chest: No stridor. He has no wheezes. He has no rales. He exhibits no tenderness.  Abdominal: He exhibits no distension. There is no tenderness. There is no rebound.  Musculoskeletal: Normal range of motion.  He exhibits no edema.  Lymphadenopathy:    He has no cervical adenopathy.  Neurological: He is oriented to person, place, and time. He exhibits normal muscle tone. Coordination normal.  Skin: No rash noted. No erythema.  Psychiatric: He has a normal mood and affect. His behavior is normal.     ED Treatments / Results  Labs (all labs ordered are listed, but only abnormal results are displayed) Labs Reviewed  CBC - Abnormal; Notable for the following:       Result Value    WBC 12.1 (*)    All other components within normal limits  URINALYSIS, ROUTINE W REFLEX MICROSCOPIC - Abnormal; Notable for the following:    Hgb urine dipstick MODERATE (*)    All other components within normal limits  BASIC METABOLIC PANEL  I-STAT TROPOININ, ED  I-STAT CG4 LACTIC ACID, ED  I-STAT CG4 LACTIC ACID, ED    EKG  EKG Interpretation  Date/Time:  Thursday September 15 2016 16:14:27 EDT Ventricular Rate:  95 PR Interval:  142 QRS Duration: 84 QT Interval:  314 QTC Calculation: 394 R Axis:   82 Text Interpretation:  Normal sinus rhythm Normal ECG Confirmed by Denell Cothern  MD, Mikael Skoda [phone]) on 09/15/2016 4:47:06 PM       Radiology Dg Chest 2 View  Result Date: 09/15/2016 CLINICAL DATA:  31 year old male with a history of shortness of breath EXAM: CHEST  2 VIEW COMPARISON:  04/02/2016, 09/30/2011 FINDINGS: Cardiomediastinal silhouette within normal limits. No evidence of central vascular congestion. No confluent airspace disease or pleural effusion.  No pneumothorax. No displaced fracture. IMPRESSION: No radiographic evidence of acute cardiopulmonary disease Electronically Signed   By: Gilmer Mor D.O.   On: 09/15/2016 16:54    Procedures Procedures (including critical care time)  Medications Ordered in ED Medications  morphine 4 MG/ML injection 4 mg (4 mg Intravenous Given 09/15/16 1754)  acetaminophen (TYLENOL) tablet 650 mg (650 mg Oral Given 09/15/16 2029)     Initial Impression / Assessment and Plan / ED Course  I have reviewed the triage vital signs and the nursing notes.  Pertinent labs & imaging results that were available during my care of the patient were reviewed by me and considered in my medical decision making (see chart for details).     Patient with chest pain. Doubt coronary artery disease. Patient developed a fever while in the emergency department. Suspect viral syndrome for fever and aches.. Patient is seen at effects disease clinic by Dr. Ninetta Lights  he will follow-up with them next week Final Clinical Impressions(s) / ED Diagnoses   Final diagnoses:  Chest pain at rest    New Prescriptions New Prescriptions   No medications on file     Bethann Berkshire, MD 09/15/16 2139

## 2016-09-15 NOTE — ED Notes (Signed)
Pt. Felt warm. Oral temp taken, 101.5.Marland KitchenMarland Kitchen Nurse informed

## 2016-09-15 NOTE — ED Notes (Signed)
Pt verbalized understanding discharge instructions and denies any further needs or questions at this time. VS stable, ambulatory and steady gait.   

## 2016-09-15 NOTE — Discharge Instructions (Signed)
Tylenol or Motrin for fever and aches. Follow-up with Dr. Ninetta Lights next week at the infectious disease clinic call tomorrow for an appointment

## 2016-09-15 NOTE — ED Triage Notes (Signed)
Pt presents with c/o CP and SOB. The symptoms began several days ago. hes also felt weak, dizzy, and had chills. He has been taking ibuprofen and tylenol with no improvement.

## 2016-09-15 NOTE — ED Notes (Signed)
Pt had not taken nitro or ASP for this pain. Pt report allergic to ASP, causes itching. Pt has hx of MI in 2017

## 2016-09-16 ENCOUNTER — Emergency Department (HOSPITAL_COMMUNITY): Payer: Medicare Other

## 2016-09-16 ENCOUNTER — Other Ambulatory Visit: Payer: Self-pay

## 2016-09-16 ENCOUNTER — Inpatient Hospital Stay (HOSPITAL_COMMUNITY)
Admission: EM | Admit: 2016-09-16 | Discharge: 2016-09-19 | DRG: 391 | Disposition: A | Discharge status: Home / Self Care | Payer: Medicare Other | Attending: Internal Medicine | Admitting: Internal Medicine

## 2016-09-16 ENCOUNTER — Telehealth: Payer: Self-pay | Admitting: *Deleted

## 2016-09-16 ENCOUNTER — Encounter (HOSPITAL_COMMUNITY): Payer: Self-pay | Admitting: Radiology

## 2016-09-16 DIAGNOSIS — Z8619 Personal history of other infectious and parasitic diseases: Secondary | ICD-10-CM | POA: Insufficient documentation

## 2016-09-16 DIAGNOSIS — R112 Nausea with vomiting, unspecified: Secondary | ICD-10-CM | POA: Diagnosis not present

## 2016-09-16 DIAGNOSIS — R319 Hematuria, unspecified: Secondary | ICD-10-CM | POA: Diagnosis present

## 2016-09-16 DIAGNOSIS — Z888 Allergy status to other drugs, medicaments and biological substances status: Secondary | ICD-10-CM | POA: Diagnosis not present

## 2016-09-16 DIAGNOSIS — F250 Schizoaffective disorder, bipolar type: Secondary | ICD-10-CM | POA: Diagnosis not present

## 2016-09-16 DIAGNOSIS — B59 Pneumocystosis: Secondary | ICD-10-CM

## 2016-09-16 DIAGNOSIS — A084 Viral intestinal infection, unspecified: Principal | ICD-10-CM | POA: Diagnosis present

## 2016-09-16 DIAGNOSIS — R300 Dysuria: Secondary | ICD-10-CM | POA: Diagnosis not present

## 2016-09-16 DIAGNOSIS — R0600 Dyspnea, unspecified: Secondary | ICD-10-CM | POA: Diagnosis not present

## 2016-09-16 DIAGNOSIS — Z79899 Other long term (current) drug therapy: Secondary | ICD-10-CM

## 2016-09-16 DIAGNOSIS — R509 Fever, unspecified: Secondary | ICD-10-CM | POA: Diagnosis not present

## 2016-09-16 DIAGNOSIS — R0902 Hypoxemia: Secondary | ICD-10-CM

## 2016-09-16 DIAGNOSIS — I252 Old myocardial infarction: Secondary | ICD-10-CM

## 2016-09-16 DIAGNOSIS — R0789 Other chest pain: Secondary | ICD-10-CM | POA: Diagnosis not present

## 2016-09-16 DIAGNOSIS — Z886 Allergy status to analgesic agent status: Secondary | ICD-10-CM | POA: Diagnosis not present

## 2016-09-16 DIAGNOSIS — I25118 Atherosclerotic heart disease of native coronary artery with other forms of angina pectoris: Secondary | ICD-10-CM | POA: Diagnosis not present

## 2016-09-16 DIAGNOSIS — E669 Obesity, unspecified: Secondary | ICD-10-CM | POA: Diagnosis present

## 2016-09-16 DIAGNOSIS — Z6833 Body mass index (BMI) 33.0-33.9, adult: Secondary | ICD-10-CM | POA: Diagnosis not present

## 2016-09-16 DIAGNOSIS — J181 Lobar pneumonia, unspecified organism: Secondary | ICD-10-CM | POA: Diagnosis not present

## 2016-09-16 DIAGNOSIS — I10 Essential (primary) hypertension: Secondary | ICD-10-CM | POA: Diagnosis not present

## 2016-09-16 DIAGNOSIS — Z91018 Allergy to other foods: Secondary | ICD-10-CM | POA: Diagnosis not present

## 2016-09-16 DIAGNOSIS — Z21 Asymptomatic human immunodeficiency virus [HIV] infection status: Secondary | ICD-10-CM | POA: Diagnosis not present

## 2016-09-16 DIAGNOSIS — R079 Chest pain, unspecified: Secondary | ICD-10-CM | POA: Diagnosis not present

## 2016-09-16 DIAGNOSIS — E869 Volume depletion, unspecified: Secondary | ICD-10-CM | POA: Diagnosis present

## 2016-09-16 DIAGNOSIS — B20 Human immunodeficiency virus [HIV] disease: Secondary | ICD-10-CM | POA: Diagnosis not present

## 2016-09-16 DIAGNOSIS — Z8249 Family history of ischemic heart disease and other diseases of the circulatory system: Secondary | ICD-10-CM | POA: Diagnosis not present

## 2016-09-16 DIAGNOSIS — R0602 Shortness of breath: Secondary | ICD-10-CM | POA: Diagnosis not present

## 2016-09-16 DIAGNOSIS — R740 Nonspecific elevation of levels of transaminase and lactic acid dehydrogenase [LDH]: Secondary | ICD-10-CM

## 2016-09-16 DIAGNOSIS — Z8661 Personal history of infections of the central nervous system: Secondary | ICD-10-CM | POA: Insufficient documentation

## 2016-09-16 DIAGNOSIS — F259 Schizoaffective disorder, unspecified: Secondary | ICD-10-CM | POA: Diagnosis not present

## 2016-09-16 HISTORY — DX: Headache, unspecified: R51.9

## 2016-09-16 HISTORY — DX: Headache: R51

## 2016-09-16 LAB — URINALYSIS, ROUTINE W REFLEX MICROSCOPIC
BACTERIA UA: NONE SEEN
BILIRUBIN URINE: NEGATIVE
Glucose, UA: NEGATIVE mg/dL
KETONES UR: NEGATIVE mg/dL
NITRITE: NEGATIVE
PROTEIN: NEGATIVE mg/dL
SPECIFIC GRAVITY, URINE: 1.04 — AB (ref 1.005–1.030)
Squamous Epithelial / LPF: NONE SEEN
pH: 6 (ref 5.0–8.0)

## 2016-09-16 LAB — CBC
HEMATOCRIT: 38.5 % — AB (ref 39.0–52.0)
HEMOGLOBIN: 13.4 g/dL (ref 13.0–17.0)
MCH: 29.6 pg (ref 26.0–34.0)
MCHC: 34.8 g/dL (ref 30.0–36.0)
MCV: 85.2 fL (ref 78.0–100.0)
Platelets: 218 10*3/uL (ref 150–400)
RBC: 4.52 MIL/uL (ref 4.22–5.81)
RDW: 13.5 % (ref 11.5–15.5)
WBC: 16.6 10*3/uL — ABNORMAL HIGH (ref 4.0–10.5)

## 2016-09-16 LAB — I-STAT TROPONIN, ED: TROPONIN I, POC: 0 ng/mL (ref 0.00–0.08)

## 2016-09-16 LAB — LACTATE DEHYDROGENASE: LDH: 207 U/L — ABNORMAL HIGH (ref 98–192)

## 2016-09-16 LAB — I-STAT CG4 LACTIC ACID, ED: Lactic Acid, Venous: 0.83 mmol/L (ref 0.5–1.9)

## 2016-09-16 MED ORDER — ACETAMINOPHEN 650 MG RE SUPP: 650.0000 mg | Freq: Four times a day (QID) | RECTAL | Status: DC | PRN

## 2016-09-16 MED ORDER — ONDANSETRON HCL 4 MG/2ML IJ SOLN
4.0000 mg | Freq: Four times a day (QID) | INTRAMUSCULAR | Status: DC | PRN
  Administered 2016-09-17 – 2016-09-18 (×2): 4 mg via INTRAVENOUS
  Filled 2016-09-16 (×2): qty 2

## 2016-09-16 MED ORDER — ACETAMINOPHEN 325 MG PO TABS
650.0000 mg | ORAL_TABLET | Freq: Four times a day (QID) | ORAL | Status: DC | PRN
  Administered 2016-09-17 – 2016-09-18 (×3): 650 mg via ORAL
  Filled 2016-09-16 (×3): qty 2

## 2016-09-16 MED ORDER — HYDROMORPHONE HCL 1 MG/ML IJ SOLN
1.0000 mg | Freq: Once | INTRAMUSCULAR | Status: AC
  Administered 2016-09-16: 1 mg via INTRAVENOUS
  Filled 2016-09-16: qty 1

## 2016-09-16 MED ORDER — SODIUM CHLORIDE 0.9% FLUSH
3.0000 mL | Freq: Two times a day (BID) | INTRAVENOUS | Status: DC
  Administered 2016-09-16 – 2016-09-18 (×4): 3 mL via INTRAVENOUS

## 2016-09-16 MED ORDER — SULFAMETHOXAZOLE-TRIMETHOPRIM 400-80 MG/5ML IV SOLN
15.0000 mg/kg/d | Freq: Three times a day (TID) | INTRAVENOUS | Status: DC
  Administered 2016-09-16 – 2016-09-18 (×5): 499.2 mg via INTRAVENOUS
  Filled 2016-09-16 (×6): qty 31.2

## 2016-09-16 MED ORDER — ONDANSETRON HCL 4 MG PO TABS
4.0000 mg | ORAL_TABLET | Freq: Four times a day (QID) | ORAL | Status: DC | PRN
  Administered 2016-09-18: 4 mg via ORAL
  Filled 2016-09-16: qty 1

## 2016-09-16 MED ORDER — KETOROLAC TROMETHAMINE 30 MG/ML IJ SOLN
30.0000 mg | Freq: Four times a day (QID) | INTRAMUSCULAR | Status: DC | PRN
  Administered 2016-09-16 – 2016-09-17 (×4): 30 mg via INTRAVENOUS
  Filled 2016-09-16 (×4): qty 1

## 2016-09-16 MED ORDER — ABACAVIR-DOLUTEGRAVIR-LAMIVUD 600-50-300 MG PO TABS
1.0000 | ORAL_TABLET | Freq: Every day | ORAL | Status: DC
  Administered 2016-09-17 – 2016-09-19 (×3): 1 via ORAL
  Filled 2016-09-16 (×3): qty 1

## 2016-09-16 MED ORDER — DEXTROSE 5 % IV SOLN
500.0000 mg | Freq: Once | INTRAVENOUS | Status: AC
  Administered 2016-09-16: 500 mg via INTRAVENOUS
  Filled 2016-09-16: qty 500

## 2016-09-16 MED ORDER — IOPAMIDOL (ISOVUE-370) INJECTION 76%
INTRAVENOUS | Status: AC
  Administered 2016-09-16: 100 mL
  Filled 2016-09-16: qty 100

## 2016-09-16 MED ORDER — SODIUM CHLORIDE 0.9 % IV SOLN
INTRAVENOUS | Status: AC
  Administered 2016-09-16: 23:00:00 via INTRAVENOUS

## 2016-09-16 MED ORDER — SODIUM CHLORIDE 0.9 % IV BOLUS (SEPSIS)
1000.0000 mL | Freq: Once | INTRAVENOUS | Status: AC
  Administered 2016-09-16: 1000 mL via INTRAVENOUS

## 2016-09-16 MED ORDER — OXYCODONE HCL 5 MG PO TABS
5.0000 mg | ORAL_TABLET | ORAL | Status: DC | PRN
  Administered 2016-09-17 – 2016-09-18 (×4): 5 mg via ORAL
  Filled 2016-09-16 (×4): qty 1

## 2016-09-16 MED ORDER — ONDANSETRON HCL 4 MG/2ML IJ SOLN
4.0000 mg | Freq: Once | INTRAMUSCULAR | Status: AC
  Administered 2016-09-16: 4 mg via INTRAVENOUS
  Filled 2016-09-16: qty 2

## 2016-09-16 MED ORDER — CEFTRIAXONE SODIUM 1 G IJ SOLR
1.0000 g | Freq: Once | INTRAMUSCULAR | Status: AC
  Administered 2016-09-16: 1 g via INTRAVENOUS
  Filled 2016-09-16: qty 10

## 2016-09-16 MED ORDER — PNEUMOCOCCAL VAC POLYVALENT 25 MCG/0.5ML IJ INJ
0.5000 mL | INJECTION | INTRAMUSCULAR | Status: DC
  Filled 2016-09-16: qty 0.5

## 2016-09-16 MED ORDER — ENOXAPARIN SODIUM 40 MG/0.4ML ~~LOC~~ SOLN
40.0000 mg | Freq: Every day | SUBCUTANEOUS | Status: DC
  Administered 2016-09-17: 40 mg via SUBCUTANEOUS
  Filled 2016-09-16 (×3): qty 0.4

## 2016-09-16 MED ORDER — PANTOPRAZOLE SODIUM 40 MG PO TBEC
40.0000 mg | DELAYED_RELEASE_TABLET | Freq: Every day | ORAL | Status: DC
  Administered 2016-09-17: 40 mg via ORAL
  Filled 2016-09-16 (×2): qty 1

## 2016-09-16 NOTE — ED Triage Notes (Signed)
Patient here last night for the same thing. CO of CP that started 2 days ago that is centralized radiating"downward" described as sharp stabbing pain with no associated symptoms.

## 2016-09-16 NOTE — Progress Notes (Signed)
Pharmacy Antibiotic Note Hector [name] is a 31 y.o. male admitted on 09/16/2016 with concern for PCP PNA.  Pharmacy has been consulted for IV Bactrim dosing.  Plan: 1. Bactrim /kg/day IV every 8 hours  2. Follow renal function and lytes closely  Weight: 220 lb (99.8 kg)  Temp (24hrs), Avg:99.7 F (37.6 C), Min:99.4 F (37.4 C), Max:100 F (37.8 C)   Recent Labs Lab 09/15/16 1618 09/15/16 1637 09/15/16 1917 09/16/16 1610 09/16/16 1803  WBC 12.1*  --   --  16.6*  --   CREATININE 0.93  --   --   --   --   LATICACIDVEN  --  1.09 0.87  --  0.83    Estimated Creatinine Clearance: 146.5 mL/min (by C-G formula based on SCr of 0.93 mg/dL).    Allergies  Allergen Reactions  . Tomato Anaphylaxis and Rash  . Risperdal [Risperidone] Nausea And Vomiting  . Aspirin Itching    Antimicrobials this admission: 4/27 Bactrim >>   Thank you for allowing pharmacy to be a part of this patient's care.  Pollyann Samples, PharmD, BCPS 09/16/2016, 9:06 PM Pager: [phone]

## 2016-09-16 NOTE — H&P (Signed)
Date: 09/16/2016               Patient Name:  Hector [name] MRN: [id number]  DOB: [birth year] Age / Sex: 31 y.o., male   PCP: No Pcp Per Patient         Medical Service: Internal Medicine Teaching Service         Attending Physician: Dr. Gust Rung, DO    First Contact: Dr. Reymundo Poll Pager: [phone]  Second Contact: Dr. Darreld Mclean  Pager: [phone]       After Hours (After 5p/  First Contact Pager: [phone]  weekends / holidays): Second Contact Pager: [phone]   Chief Complaint: Chest Pain  History of Present Illness:  The patient is a 31 year old obese African-American male with a medical history of HIV recently restarted on antiretroviral therapy in March of this year, hypertension and schizoaffective disorder bipolar type who presents with 72 hour history of chest pain.  Patient states chest pain started approximately 3 days ago. It is located centrally in his chest without radiation. It is slightly worsened with deep inspiration. It is associated with nausea and vomiting as well as decreased by mouth intake. He also reports headache, fever, chills, general malaise and fatigue. He also endorses a one-day history of dysuria and a feeling of numbness radiating down his legs bilaterally. He was evaluated in the emergency department yesterday and sent home. He returns for ongoing chest pain. In addition to the above he also complains of some shortness of breath and dyspnea.  In the emergency department the patient was tachycardic, hypertensive, febrile to 101.5. Laboratory evaluation demonstrates leukocytosis with a white count of 16.6. No basic metabolic panel ordered that had resulted at the time of admission. EKG without acute ST segment elevation. Troponin negative yesterday and today. CT angiography chest without evidence of pulmonary embolus or dissection. Chest x-ray demonstrates hazy airspace disease in the medial right lower lobe concerning for atelectasis versus  pneumonia. Emergency provider discussed the case with Dr. Ilsa Iha of infectious disease who has concern for immune reconstitution syndrome and PCP pneumonia. She is recommended starting IV trimethoprim sulfamethoxazole, admitting the patient to IMTS and will formally see the patient in consult tomorrow.  Meds:  Current Meds  Medication Sig  . abacavir-dolutegravir-lamiVUDine (TRIUMEQ) 600-50-300 MG tablet Take 1 tablet by mouth daily.     Allergies: Allergies as of 09/16/2016 - Review Complete 09/16/2016  Allergen Reaction Noted  . Tomato Anaphylaxis and Rash 10/03/2011  . Risperdal [risperidone] Nausea And Vomiting 10/04/2011  . Aspirin Itching 09/15/2016   Past Medical History:  Diagnosis Date  . Depression   . History of fractured vertebra april 8th Diagnosed   Larey Seat out of a tree while climbing it.  . Human immunodeficiency virus I infection (HCC) 2016  . Hypertension   . Mental disorder   . Myocardial infarction [facility])     Family History:  Family History  Problem Relation Age of Onset  . CAD Mother 61    MI     Social History:  Social History   Social History  . Marital status: Single    Spouse name: N/A  . Number of children: N/A  . Years of education: N/A   Occupational History  . Not on file.   Social History Main Topics  . Smoking status: Never Smoker  . Smokeless tobacco: Never Used  . Alcohol use No  . Drug use: No  . Sexual activity: Not on file  Comment: declined    Other Topics Concern  . Not on file   Social History Narrative  . No narrative on file     Review of Systems: A complete ROS was negative except as per HPI.   Physical Exam: Blood pressure (!) 141/79, pulse 99, temperature 99.4 F (37.4 C), temperature source Oral, resp. rate 19, weight 99.8 kg (220 lb), SpO2 98 %. Physical Exam  Constitutional: He is oriented to person, place, and time. He appears well-developed and well-nourished.  HENT:  Head: Normocephalic and  atraumatic.  Cardiovascular: Regular rhythm.  Exam reveals no gallop and no friction rub.   No murmur heard. Tachycardic  Respiratory: Effort normal and breath sounds normal.  GI: Soft. He exhibits no distension. There is no tenderness.  Neurological: He is alert and oriented to person, place, and time.     EKG: Sinus rhythm, no acute ST segment elevation  CXR: Hazy airspace disease in the medial right lower lobe concerning for atelectasis versus pneumonia  Assessment & Plan by Problem: Principal Problem:   Pneumonia, pneumocystis (HCC) Active Problems:   Schizoaffective disorder, bipolar type (HCC)   Human immunodeficiency virus I infection (HCC)   # Chest pain associated with constitutional symptoms (Question atypical pneumonia vs immune reconstitution) # HIV Patient's chest pain does not seem consistent with ischemic etiology. Troponin and EKG negative. Chest x-ray does demonstrate hazy opacities in the right lower lobe concerning for atelectasis vs pneumonia. Patient febrile with a white count. The differential diagnosis for the patient's presentation is broad and includes viral and bacterial pneumonia. This could also be in the setting of immune reconstitution having recently restarted antiretroviral therapy. Additionally there is a concern for PCP pneumonia giving radiographic findings and history of dyspnea and shortness of breath. Infectious disease has been made aware of the case and will officially see the patient in consultation tomorrow. He was given a dose of azithromycin and ceftriaxone in the emergency department for community-acquired pneumonia however I do not think the typical bacteria associated community-acquired pneumonia fit in this situation I would recommend not continuing these antibiotics. Patient appears dry on examination and I suspect his tachycardia is secondary to volume depletion. Will hydrate aggressively overnight. We will start IV trimethoprim  sulfamethoxazole overnight and await for ID consult. -- Infectious disease following, appreciate recommendations -- CD4 count -- IV trimethoprim sulfamethoxazole -- Discontinue azithromycin and ceftriaxone -- Pain control -- Normal saline at 150 mils per hour -- Triumeq -- BMP  # Dysuria Patient endorses one-day history of pain with urination. Will collect urine for urinalysis and culture. No history of sexual transmitted infections. No self-report of genital lesions. -- Urinalysis and culture  # History of psychiatric illness Patient not currently taking medication as his illness is stable and not interfering with daily activities. -- Continue to follow  DVT/PE prophylaxis: Lovenox FEN/GI: Regular diet Code: Full code  Dispo: Admit patient to Inpatient with expected length of stay greater than 2 midnights.  Signed: Thomasene Lot, MD 09/16/2016, 10:11 PM  Pager: [phone]

## 2016-09-16 NOTE — Telephone Encounter (Signed)
Patient was seen in the ED at [facility] yesterday and told to follow up with Dr. Ninetta Lights in one week. Unfortunately Dr. Moshe Cipro next available appointment is in June and patient has an appt scheduled for 10/26/16. He is still not feeling well with complaints of chest pain and fever. He had multiple complaints about the care he received at the hospital and said he will not go back there; however he was going to go to [facility] ED. He stated he had previously had a "mild heart attack". Patient advised if he does not have anyone to drive him to the hospital, he should call 911. He agreed to this plan. Note routed to Dr. Ninetta Lights to see if he should be seen sooner than June. He does not have a PCP.

## 2016-09-16 NOTE — ED Notes (Signed)
This RN had the pt yesterday and was the nurse that discharged the pt.  Pt came into today c/o of improper care on the pt's previous visit.  Pt states " my chest pain has gotten worse, to the point I fell out of bed and had to call the ambulance.  My ID MD told me y'all should not have discharged me yesterday."  Pt also states that he fell once he left the ED yesterday and blames it on getting  of morphine IV.  Pt has a steady gate when he was discharged 2+ hours from the time he received the Morphine.

## 2016-09-16 NOTE — ED Notes (Addendum)
Floor RN wanted updated temp.  Pt 99.30F.  Pt has had bed for 90 mins, 6E Floor RN has no other questions.
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
## 2016-09-17 DIAGNOSIS — Z21 Asymptomatic human immunodeficiency virus [HIV] infection status: Secondary | ICD-10-CM

## 2016-09-17 DIAGNOSIS — Z888 Allergy status to other drugs, medicaments and biological substances status: Secondary | ICD-10-CM

## 2016-09-17 DIAGNOSIS — F259 Schizoaffective disorder, unspecified: Secondary | ICD-10-CM

## 2016-09-17 DIAGNOSIS — R0602 Shortness of breath: Secondary | ICD-10-CM

## 2016-09-17 DIAGNOSIS — I10 Essential (primary) hypertension: Secondary | ICD-10-CM

## 2016-09-17 DIAGNOSIS — Z91018 Allergy to other foods: Secondary | ICD-10-CM

## 2016-09-17 DIAGNOSIS — I25118 Atherosclerotic heart disease of native coronary artery with other forms of angina pectoris: Secondary | ICD-10-CM

## 2016-09-17 DIAGNOSIS — R509 Fever, unspecified: Secondary | ICD-10-CM

## 2016-09-17 DIAGNOSIS — Z886 Allergy status to analgesic agent status: Secondary | ICD-10-CM

## 2016-09-17 DIAGNOSIS — Z8249 Family history of ischemic heart disease and other diseases of the circulatory system: Secondary | ICD-10-CM

## 2016-09-17 LAB — COMPREHENSIVE METABOLIC PANEL
ALBUMIN: 3.3 g/dL — AB (ref 3.5–5.0)
ALK PHOS: 62 U/L (ref 38–126)
ALT: 25 U/L (ref 17–63)
ANION GAP: 7 (ref 5–15)
AST: 20 U/L (ref 15–41)
BUN: 7 mg/dL (ref 6–20)
CALCIUM: 8.7 mg/dL — AB (ref 8.9–10.3)
CO2: 26 mmol/L (ref 22–32)
CREATININE: 0.89 mg/dL (ref 0.61–1.24)
Chloride: 101 mmol/L (ref 101–111)
GFR calc Af Amer: >60 mL/min (ref >60)
GFR calc non Af Amer: >60 mL/min (ref >60)
GLUCOSE: 107 mg/dL — AB (ref 65–99)
Potassium: 3.1 mmol/L — ABNORMAL LOW (ref 3.5–5.1)
SODIUM: 134 mmol/L — AB (ref 135–145)
Total Bilirubin: 0.7 mg/dL (ref 0.3–1.2)
Total Protein: 8.8 g/dL — ABNORMAL HIGH (ref 6.5–8.1)

## 2016-09-17 LAB — CBC
HEMATOCRIT: 36.3 % — AB (ref 39.0–52.0)
HEMOGLOBIN: 12.4 g/dL — AB (ref 13.0–17.0)
MCH: 29.1 pg (ref 26.0–34.0)
MCHC: 34.2 g/dL (ref 30.0–36.0)
MCV: 85.2 fL (ref 78.0–100.0)
Platelets: 202 10*3/uL (ref 150–400)
RBC: 4.26 MIL/uL (ref 4.22–5.81)
RDW: 13.2 % (ref 11.5–15.5)
WBC: 16.2 10*3/uL — AB (ref 4.0–10.5)

## 2016-09-17 LAB — CK: Total CK: 133 U/L (ref 49–397)

## 2016-09-17 LAB — PROTIME-INR
INR: 1.23
Prothrombin Time: 15.5 seconds — ABNORMAL HIGH (ref 11.4–15.2)

## 2016-09-17 MED ORDER — POTASSIUM CHLORIDE CRYS ER 20 MEQ PO TBCR
40.0000 meq | EXTENDED_RELEASE_TABLET | Freq: Two times a day (BID) | ORAL | Status: AC
  Administered 2016-09-17 (×2): 40 meq via ORAL
  Filled 2016-09-17 (×2): qty 2

## 2016-09-17 NOTE — Consult Note (Addendum)
[name]  Total days of antibiotics 2        Day 2 bactrim        Day 2 azithro        Day 2 ceftriaxone       Reason for Consult: fever in hiv patient    Referring Physician: hoffman  Principal Problem:   Pneumonia, pneumocystis ([place]) Active Problems:   Schizoaffective disorder, bipolar type ([place])   Human immunodeficiency virus I infection ([place])   HPI: Hector [name] is a 31 y.o. male with HIV disease, schizoaffective disorder, CAD who was dx with HIV in 2016. HIV GT shows A98G, K103N, with unknown CD 4 count /VL 57,800. Previously on truvada-DRVr through ID provider in [place], [place]. He was off of ART for roughly 3 months. He was re-established care with Dr Hector [name] on 3/7 and was started on triumeq. He presented to the ED on 4/26 with mid sternal chest pain that was thought to be non-cardiac given normal EKG. He did develop fever during his visit and conclusion was thought to be viral syndrome. He represented to the ED the following day on 4/27,  still ongoing chest discomfort and fevers. His temp in ED was 101.49F, generalized chest pain and mild shortness of breath, labs showed leukocytosis of 16.6, LDH of 207. CXR showed possible medial RLL infiltrate per my read. The patient did undergo CTA negative for PE or dissection, and little parenchymal disease. He states that his chest discomfort is slightly better, occurring at rest.  Past Medical History:  Diagnosis Date  . Depression   . Headache   . History of fractured vertebra april 8th Diagnosed   [place] out of a tree while climbing it.  . Human immunodeficiency virus I infection ([place]) 2016  . Hypertension   . Mental disorder   . Myocardial infarction [facility])     Allergies:  Allergies  Allergen Reactions  . Tomato Anaphylaxis and Rash  . Risperdal [Risperidone] Nausea And Vomiting  . Aspirin Itching    MEDICATIONS: . abacavir-dolutegravir-lamiVUDine  1 tablet Oral Daily  . enoxaparin (LOVENOX)  injection  40 mg Subcutaneous Daily  . pantoprazole  40 mg Oral Daily  . pneumococcal 23 valent vaccine  0.5 mL Intramuscular Tomorrow-1000  . sodium chloride flush  3 mL Intravenous Q12H    Social History  Substance Use Topics  . Smoking status: Never Smoker  . Smokeless tobacco: Never Used  . Alcohol use No  - no sexual activity in 4 months  Family History  Problem Relation Age of Onset  . CAD Mother 69    MI     Review of Systems  Constitutional: positive for fever, chills, diaphoresis, activity change, appetite change, fatigue and unexpected weight change.  HENT: Negative for congestion, sore throat, rhinorrhea, sneezing, trouble swallowing and sinus pressure.  Eyes: Negative for photophobia and visual disturbance.  Respiratory:  Positive for shortness of breath. Negative for cough, chest tightness, wheezing and stridor.  Cardiovascular: Negative for chest pain, palpitations and leg swelling.  Gastrointestinal: Negative for nausea, vomiting, abdominal pain, diarrhea, constipation, blood in stool, abdominal distention and anal bleeding.  Genitourinary: dysuria. Negative for dysuria, hematuria, flank pain and difficulty urinating.  Musculoskeletal: Negative for myalgias, back pain, joint swelling, arthralgias and gait problem.  Skin: Negative for color change, pallor, rash and wound.  Neurological: Negative for dizziness, tremors, weakness and light-headedness.  Hematological: Negative for adenopathy. Does not bruise/bleed easily.  Psychiatric/Behavioral: Negative for behavioral problems, confusion,  sleep disturbance, dysphoric mood, decreased concentration and agitation.     OBJECTIVE: Temp:  [98.5 F (36.9 C)-100.9 F (38.3 C)] 98.6 F (37 C) (04/28 1003) Pulse Rate:  [76-115] 76 (04/28 1003) Resp:  [15-27] 16 (04/28 1003) BP: (118-160)/(47-97) 118/63 (04/28 1003) SpO2:  [97 %-100 %] 100 % (04/28 1003) Weight:  [220 lb (99.8 kg)-263 lb 7.2 oz (119.5 kg)] 263 lb 7.2 oz  (119.5 kg) (04/27 2253) Physical Exam  Constitutional: He is oriented to person, place, and time. He appears well-developed and well-nourished. No distress. Damp gown HENT:  Mouth/Throat: Oropharynx is clear and moist. No oropharyngeal exudate.  Cardiovascular: Normal rate, regular rhythm and normal heart sounds. Exam reveals no gallop and no friction rub.  No murmur heard.  Pulmonary/Chest: Effort normal and breath sounds normal. No respiratory distress. He has no wheezes.  Abdominal: Soft. Bowel sounds are normal. He exhibits no distension. There is no tenderness.  Lymphadenopathy:  He has no cervical adenopathy.  Neurological: He is alert and oriented to person, place, and time.  Skin: Skin is warm and dry. No rash noted. No erythema.  Psychiatric: He has a normal mood and affect. His behavior is normal.     LABS: Results for orders placed or performed during the hospital encounter of 09/16/16 (from the past 48 hour(s))  CBC     Status: Abnormal   Collection Time: 09/16/16  4:10 PM  Result Value Ref Range   WBC 16.6 (H) 4.0 - 10.5 K/uL   RBC 4.52 4.22 - 5.81 MIL/uL   Hemoglobin 13.4 13.0 - 17.0 g/dL   HCT 38.5 (L) 39.0 - 52.0 %   MCV 85.2 78.0 - 100.0 fL   MCH 29.6 26.0 - 34.0 pg   MCHC 34.8 30.0 - 36.0 g/dL   RDW 13.5 11.5 - 15.5 %   Platelets 218 150 - 400 K/uL  I-stat troponin, ED     Status: None   Collection Time: 09/16/16  4:27 PM  Result Value Ref Range   Troponin i, poc 0.00 0.00 - 0.08 ng/mL   Comment 3            Comment: Due to the release kinetics of cTnI, a negative result within the first hours of the onset of symptoms does not rule out myocardial infarction with certainty. If myocardial infarction is still suspected, repeat the test at appropriate intervals.   Lactate dehydrogenase     Status: Abnormal   Collection Time: 09/16/16  5:46 PM  Result Value Ref Range   LDH 207 (H) 98 - 192 U/L  I-Stat CG4 Lactic Acid, ED     Status: None   Collection Time:  09/16/16  6:03 PM  Result Value Ref Range   Lactic Acid, Venous 0.83 0.5 - 1.9 mmol/L  Urinalysis, Routine w reflex microscopic     Status: Abnormal   Collection Time: 09/16/16 11:15 PM  Result Value Ref Range   Color, Urine YELLOW YELLOW   APPearance CLEAR CLEAR   Specific Gravity, Urine 1.040 (H) 1.005 - 1.030   pH 6.0 5.0 - 8.0   Glucose, UA NEGATIVE NEGATIVE mg/dL   Hgb urine dipstick MODERATE (A) NEGATIVE   Bilirubin Urine NEGATIVE NEGATIVE   Ketones, ur NEGATIVE NEGATIVE mg/dL   Protein, ur NEGATIVE NEGATIVE mg/dL   Nitrite NEGATIVE NEGATIVE   Leukocytes, UA TRACE (A) NEGATIVE   RBC / HPF 6-30 0 - 5 RBC/hpf   WBC, UA 6-30 0 - 5 WBC/hpf   Bacteria, UA NONE  SEEN NONE SEEN   Squamous Epithelial / LPF NONE SEEN NONE SEEN  CBC     Status: Abnormal   Collection Time: 09/17/16 12:58 AM  Result Value Ref Range   WBC 16.2 (H) 4.0 - 10.5 K/uL   RBC 4.26 4.22 - 5.81 MIL/uL   Hemoglobin 12.4 (L) 13.0 - 17.0 g/dL   HCT 36.3 (L) 39.0 - 52.0 %   MCV 85.2 78.0 - 100.0 fL   MCH 29.1 26.0 - 34.0 pg   MCHC 34.2 30.0 - 36.0 g/dL   RDW 13.2 11.5 - 15.5 %   Platelets 202 150 - 400 K/uL  Protime-INR     Status: Abnormal   Collection Time: 09/17/16 12:58 AM  Result Value Ref Range   Prothrombin Time 15.5 (H) 11.4 - 15.2 seconds   INR 1.23   Comprehensive metabolic panel     Status: Abnormal   Collection Time: 09/17/16 12:58 AM  Result Value Ref Range   Sodium 134 (L) 135 - 145 mmol/L   Potassium 3.1 (L) 3.5 - 5.1 mmol/L   Chloride 101 101 - 111 mmol/L   CO2 26 22 - 32 mmol/L   Glucose, Bld 107 (H) 65 - 99 mg/dL   BUN 7 6 - 20 mg/dL   Creatinine, Ser 0.89 0.61 - 1.24 mg/dL   Calcium 8.7 (L) 8.9 - 10.3 mg/dL   Total Protein 8.8 (H) 6.5 - 8.1 g/dL   Albumin 3.3 (L) 3.5 - 5.0 g/dL   AST 20 15 - 41 U/L   ALT 25 17 - 63 U/L   Alkaline Phosphatase 62 38 - 126 U/L   Total Bilirubin 0.7 0.3 - 1.2 mg/dL   GFR calc non Af Amer >60 >60 mL/min   GFR calc Af Amer >60 >60 mL/min    Comment:  (NOTE) The eGFR has been calculated using the CKD EPI equation. This calculation has not been validated in all clinical situations. eGFR's persistently <60 mL/min signify possible Chronic Kidney Disease.    Anion gap 7 5 - 15  CK     Status: None   Collection Time: 09/17/16 12:58 AM  Result Value Ref Range   Total CK 133 49 - 397 U/L    MICRO: 4/27 blood cx pending 4/28 urine cx pending IMAGING: Dg Chest 2 View  Result Date: 09/16/2016 CLINICAL DATA:  Chest pain since yesterday.  Fevers. EXAM: CHEST  2 VIEW COMPARISON:  09/15/2016 FINDINGS: There is hazy airspace disease in the medial right lower lobe concerning for atelectasis versus pneumonia. There is no pleural effusion or pneumothorax. The heart and mediastinal contours are unremarkable. The osseous structures are unremarkable. IMPRESSION: Hazy airspace disease in the medial right lower lobe concerning for atelectasis versus pneumonia. Electronically Signed   By: Kathreen Devoid   On: 09/16/2016 16:40   Dg Chest 2 View  Result Date: 09/15/2016 CLINICAL DATA:  31 year old male with a history of shortness of breath EXAM: CHEST  2 VIEW COMPARISON:  04/02/2016, 09/30/2011 FINDINGS: Cardiomediastinal silhouette within normal limits. No evidence of central vascular congestion. No confluent airspace disease or pleural effusion.  No pneumothorax. No displaced fracture. IMPRESSION: No radiographic evidence of acute cardiopulmonary disease Electronically Signed   By: Corrie Mckusick D.O.   On: 09/15/2016 16:54   Ct Angio Chest/abd/pel For Dissection W And/or Wo Contrast  Result Date: 09/16/2016 CLINICAL DATA:  Chest pain, dyspnea and leg numbness x2 days EXAM: CT ANGIOGRAPHY CHEST, ABDOMEN AND PELVIS TECHNIQUE: Multidetector CT imaging through the chest,  abdomen and pelvis was performed using the standard protocol during bolus administration of intravenous contrast. Multiplanar reconstructed images and MIPs were obtained and reviewed to evaluate the  vascular anatomy. CONTRAST:  100 cc Isovue-300 IV COMPARISON:  None. FINDINGS: CTA CHEST FINDINGS Cardiovascular: Normal branch pattern of the great vessels. No aortic aneurysm or dissection. Cardiac motion artifacts are seen along portions of the main pulmonary artery and ascending aorta. No large central pulmonary embolus. Mediastinum/Nodes: Mild enlargement of the axillary lymph nodes up to 15 mm short axis possibly reactive in etiology. No mediastinal or hilar adenopathy. The main stem bronchi and trachea are patent. No esophageal abnormality. The thyroid gland is nonsuspicious. Lungs/Pleura: Motion related artifacts are noted. No pneumonic consolidation, effusion or pneumothorax is seen. There is bibasilar dependent atelectasis. Musculoskeletal: No focal disc herniation, fracture nor bone destruction. Review of the MIP images confirms the above findings. CTA ABDOMEN AND PELVIS FINDINGS VASCULAR Aorta: Normal caliber aorta without aneurysm, dissection, vasculitis or significant stenosis. Celiac: Patent without evidence of aneurysm, dissection, vasculitis or significant stenosis. SMA: Patent without evidence of aneurysm, dissection, vasculitis or significant stenosis. Renals: Patent single renal arteries to both kidneys. No evidence of aneurysm or dissection. IMA: Patent Inflow: Patent without evidence of aneurysm, dissection, vasculitis or significant stenosis. Veins: No obvious venous abnormality within the limitations of this arterial phase study. Review of the MIP images confirms the above findings. NON-VASCULAR Hepatobiliary: No focal liver abnormality is seen. No gallstones, gallbladder wall thickening, or biliary dilatation. Pancreas: Unremarkable. No pancreatic ductal dilatation or surrounding inflammatory changes. Spleen: Normal in size without focal abnormality. Adrenals/Urinary Tract: Adrenal glands are unremarkable. Kidneys are normal, without renal calculi, focal lesion, or hydronephrosis. Bladder is  unremarkable. Stomach/Bowel: Stomach is within normal limits. Appendix appears normal. No evidence of bowel wall thickening, distention, or inflammatory changes. Lymphatic: No pelvic, inguinal, mesenteric, retroperitoneal or abdominal adenopathy. Reproductive: Prostate is unremarkable. Other: No abdominal wall hernia or abnormality. No abdominopelvic ascites. Musculoskeletal: No acute or significant osseous findings. Review of the MIP images confirms the above findings. IMPRESSION: 1. No aortic aneurysm, dissection or pulmonary embolus noted. 2. No acute pulmonary abnormality.  Bibasilar dependent atelectasis. 3. Unremarkable CT of the abdomen and pelvis. Electronically Signed   By: Ashley Royalty M.D.   On: 09/16/2016 20:02    HISTORICAL MICRO/IMAGING  Assessment/Plan:   31yo M with HIV disease with febrile illness. Initially admitted for CAP/PCP concern for pneumonia associated with IRIS. Though his TLC in FEb is 1.9K, so unlikely to be pcp. LDH only mildly elevated though CXR and chest CT not markedly abnormal.  - would d/c bactrim - will add differential to see if lymphocytic predominance, possibly pointing towards more viral infection  Fevers: - if still febrile tomorrow, recommend to get fungal and AFB blood cx - continue to follow up on admit blood culture - could be due to immune reconstitution vs. Other viral infection - he is clinically stable, can continue to monitor without abtx  hiv: -continue on triumeq, per his routine schedule  Shortness of breath: - imaging doesn't suggest infiltrate that would be consistent with bacterial infection - can consider getting RVP if patient is not improving  Atypical angina- - trops are negative, though he states he has history of "small heart attack" in 2017 - ekg not suggestive of ischemia - unclear source, may consider giving a trial of reflux or gi cocktain medication to see if any change in symptoms  Schizoaffective disorder - continue on  home meds

## 2016-09-17 NOTE — Progress Notes (Addendum)
   Subjective: Patient feels lousy today. He endorses chest pain that is worse with cough and exertion. Also endorsing malaise and fatigue. Febrile to 100.9 yesterday evening.   Objective:  Vital signs in last 24 hours: Vitals:   09/16/16 2228 09/16/16 2253 09/17/16 0537 09/17/16 1003  BP:  (!) 160/97 132/73 118/63  Pulse:  100 98 76  Resp:  Temp: 99.9 F (37.7 C) (!) 100.9 F (38.3 C) 98.5 F (36.9 C) 98.6 F (37 C)  TempSrc: Oral Oral Oral Oral  SpO2:  99% 100% 100%  Weight:  263 lb 7.2 oz (119.5 kg)    Height:   (1.88 m)     Physical Exam Constitutional: NAD, appears comfortable Cardiovascular: RRR, no murmurs, rubs, or gallops.  Pulmonary/Chest: CTAB, no wheezes, rales, or rhonchi.   Abdominal: Soft, non tender, non distended. +BS.  Extremities: Warm and well perfused. No edema.  Neurological: A&Ox3, CN II - XII grossly intact.  Skin: No rashes or erythema  Psychiatric: Normal mood and affect  Assessment/Plan:  Patient is a 31 year old African-American male with a past medical history significant for HIV (recently restarted antiretroviral viral therapy in March of this year), hypertension, and schizoaffective disorder/bipolar disorder, who presented with chest pain and generalized malaise x 3 days.   Chest Pain: In the setting of HIV, febrile illness, and constitutional symptoms of nausea, vomiting, chills, and malaise. Ischemic work up has been reassuring (negative trop, non ischemic EKG). CXR was initially concerning for a right lower lobe infiltrate, but follow up CTA chest was negative, notable only for bibasilar atelectasis. ID was consulted per EDP who recommended started IV bactrim for PCP coverage. Patient recently started antiretroviral therapy and there is also concern for immune reconstitution syndrome.  -- ID consult, appreciate recommendations  -- F/u CD4 count -- Continue IV Bactrim  -- Continue NS @ 150  -- Continue Triumeq  -- Oxy 5 mg q4  prn for pain   Dysuria: Patient endorsed pain with urination on admission. UA showed moderate hematuria and trace leukocytes. Already receiving IV bactrim for PCP coverage.  -- F/u add-on urine culture   Hx of Psychiatric Illness: Patient not currently taking medication as his illness is stable and not interfering with daily activities. -- Continue to follow  DVT/PE prophylaxis: Lovenox FEN/GI: Regular diet Code: Full code  Dispo: Anticipated discharge pending improvement in clinical status and ID evaluation and plan.   Reymundo Poll, MD 09/17/2016, 10:56 AM Pager: [phone]  If no return after 15 minutes, or after hours, please page:  1st Contact: Pager: [phone] 2nd Contact: Pager: [phone]

## 2016-09-18 LAB — BASIC METABOLIC PANEL
ANION GAP: 6 (ref 5–15)
BUN: 8 mg/dL (ref 6–20)
CHLORIDE: 100 mmol/L — AB (ref 101–111)
CO2: 27 mmol/L (ref 22–32)
Calcium: 8.6 mg/dL — ABNORMAL LOW (ref 8.9–10.3)
Creatinine, Ser: 0.97 mg/dL (ref 0.61–1.24)
GFR calc non Af Amer: >60 mL/min (ref >60)
GLUCOSE: 95 mg/dL (ref 65–99)
Potassium: 3.4 mmol/L — ABNORMAL LOW (ref 3.5–5.1)
Sodium: 133 mmol/L — ABNORMAL LOW (ref 135–145)

## 2016-09-18 LAB — URINE CULTURE: CULTURE: NO GROWTH

## 2016-09-18 LAB — RESPIRATORY PANEL BY PCR
Adenovirus: NOT DETECTED
BORDETELLA PERTUSSIS-RVPCR: NOT DETECTED
CHLAMYDOPHILA PNEUMONIAE-RVPPCR: NOT DETECTED
Coronavirus 229E: NOT DETECTED
Coronavirus HKU1: NOT DETECTED
Coronavirus NL63: NOT DETECTED
Coronavirus OC43: NOT DETECTED
INFLUENZA A-RVPPCR: NOT DETECTED
INFLUENZA B-RVPPCR: NOT DETECTED
METAPNEUMOVIRUS-RVPPCR: NOT DETECTED
Mycoplasma pneumoniae: NOT DETECTED
PARAINFLUENZA VIRUS 2-RVPPCR: NOT DETECTED
PARAINFLUENZA VIRUS 3-RVPPCR: NOT DETECTED
PARAINFLUENZA VIRUS 4-RVPPCR: NOT DETECTED
Parainfluenza Virus 1: NOT DETECTED
RESPIRATORY SYNCYTIAL VIRUS-RVPPCR: NOT DETECTED
Rhinovirus / Enterovirus: NOT DETECTED

## 2016-09-18 LAB — CBC
HEMATOCRIT: 34.8 % — AB (ref 39.0–52.0)
Hemoglobin: 11.7 g/dL — ABNORMAL LOW (ref 13.0–17.0)
MCH: 28.6 pg (ref 26.0–34.0)
MCHC: 33.6 g/dL (ref 30.0–36.0)
MCV: 85.1 fL (ref 78.0–100.0)
Platelets: 204 10*3/uL (ref 150–400)
RBC: 4.09 MIL/uL — ABNORMAL LOW (ref 4.22–5.81)
RDW: 13.1 % (ref 11.5–15.5)
WBC: 10.6 10*3/uL — AB (ref 4.0–10.5)

## 2016-09-18 LAB — DIFFERENTIAL
BASOS ABS: 0 10*3/uL (ref 0.0–0.1)
Basophils Relative: 0 %
Eosinophils Absolute: 0.1 10*3/uL (ref 0.0–0.7)
Eosinophils Relative: 1 %
LYMPHS ABS: 1.7 10*3/uL (ref 0.7–4.0)
Lymphocytes Relative: 16 %
MONOS PCT: 5 %
Monocytes Absolute: 0.5 10*3/uL (ref 0.1–1.0)
NEUTROS ABS: 8.5 10*3/uL — AB (ref 1.7–7.7)
Neutrophils Relative %: 78 %

## 2016-09-18 MED ORDER — DICLOFENAC SODIUM 1 % TD GEL
4.0000 g | Freq: Four times a day (QID) | TRANSDERMAL | Status: DC | PRN
  Filled 2016-09-18: qty 100

## 2016-09-18 MED ORDER — ONDANSETRON 4 MG PO TBDP
8.0000 mg | ORAL_TABLET | Freq: Once | ORAL | Status: AC
  Administered 2016-09-18: 8 mg via ORAL
  Filled 2016-09-18: qty 2

## 2016-09-18 MED ORDER — WHITE PETROLATUM GEL
Status: AC
  Administered 2016-09-18: 12:00:00
  Filled 2016-09-18: qty 1

## 2016-09-18 MED ORDER — GI COCKTAIL ~~LOC~~: 30.0000 mL | Freq: Three times a day (TID) | ORAL | Status: DC | PRN

## 2016-09-18 MED ORDER — PANTOPRAZOLE SODIUM 40 MG IV SOLR
40.0000 mg | INTRAVENOUS | Status: DC
  Administered 2016-09-18: 40 mg via INTRAVENOUS
  Filled 2016-09-18 (×2): qty 40

## 2016-09-18 MED ORDER — PROMETHAZINE HCL 25 MG RE SUPP: 12.5000 mg | Freq: Four times a day (QID) | RECTAL | Status: DC | PRN

## 2016-09-18 MED ORDER — PROMETHAZINE HCL 25 MG PO TABS
12.5000 mg | ORAL_TABLET | Freq: Four times a day (QID) | ORAL | Status: DC | PRN
Start: 2016-09-18 — End: 2016-09-19

## 2016-09-18 MED ORDER — POTASSIUM CHLORIDE IN NACL 40-0.9 MEQ/L-% IV SOLN
INTRAVENOUS | Status: AC
  Administered 2016-09-18: 125 mL/h via INTRAVENOUS
  Filled 2016-09-18 (×2): qty 1000

## 2016-09-18 MED ORDER — PROMETHAZINE HCL 25 MG/ML IJ SOLN
6.2500 mg | Freq: Four times a day (QID) | INTRAMUSCULAR | Status: DC | PRN
  Administered 2016-09-18: 6.25 mg via INTRAVENOUS
  Filled 2016-09-18: qty 1

## 2016-09-18 NOTE — Progress Notes (Signed)
   Subjective: Patient says he is feeling lousy today after he thought he was improving yesterday. He reports nausea, vomiting, headaches, and general malaise. He says his emesis is brown colored. Nursing staff have noted clear/gray emesis. He is having chest wall pain when he coughs. He is alternating between feeling hot and cold.  Objective:  Vital signs in last 24 hours: Vitals:   09/17/16 1003 09/17/16 1703 09/17/16 2050 09/18/16 0548  BP: 118/63 140/70 (!) 142/100 (!) 138/94  Pulse: 76 64 74 80  Resp: Temp: 98.6 F (37 C) 97.5 F (36.4 C) 98.7 F (37.1 C) 98.8 F (37.1 C)  TempSrc: Oral Oral Oral Oral  SpO2: 100% 100% 100% 100%  Weight:      Height:       Physical Exam General: standing up, appears tired Cardiac: RRR, no rubs, murmurs or gallops Pulm: clear to auscultation bilaterally, moving normal volumes of air Abd: soft, nontender, nondistended, BS present Ext: warm and well perfused, no pedal edema Neuro: alert and oriented X3   Assessment/Plan:  Patient is a 31 year old African-American male with a past medical history significant for HIV (recently restarted antiretroviral viral therapy in March of this year), hypertension, and schizoaffective disorder/bipolar disorder, who presented with chest pain and generalized malaise x 3 days.   Febrile Illness: In the setting of HIV, febrile illness, and constitutional symptoms of nausea, vomiting, chills, and malaise concerning for IRIS. He has associated chest wall pain from cough. Ischemic work up has been reassuring (negative trop, non ischemic EKG). CXR and CTA chest are not suggestive of infiltrate. ID have low suspicion for Pneumocystis pneumonia given imaging findings and only mildly elevated LDH and have recommended discontinuing Bactrim. Possible this may also be a viral infection. He is afebrile today and white count has trended down 16.2 > 10.6. -- ID following, appreciate recommendations  -- Check  Respiratory Viral Panel -- F/u CD4 count, HIV RNA -- D/c IV Bactrim  -- Resume IVF NS + K 40 mEq @ 125 mL/hr -- Continue Triumeq  -- Oxy 5 mg q4 prn for pain  -- Add Voltaren gel for chest wall pain -- Phenergan prn nausea/vomiting -- Protonix 40 mg daily -- GI cocktail prn  Hx of Psychiatric Illness: Patient not currently taking medication as his illness is stable and not interfering with daily activities. -- Continue to follow  DVT/PE prophylaxis: Lovenox FEN/GI: Regular diet Code: Full code  Dispo: Anticipated discharge pending improvement in clinical status and ID evaluation and plan.   Darreld Mclean, MD 09/18/2016, 10:12 AM

## 2016-09-18 NOTE — Progress Notes (Signed)
Pt had nausea and vomiting x 2 last night. Vomited small amounts approximately 50cc clear fluid each time. Will continue to monitor.

## 2016-09-18 NOTE — Progress Notes (Signed)
Hoffman paged about patient refusing IV access for his IV fluids. Will pass on to oncoming shift to attempt again.

## 2016-09-18 NOTE — Progress Notes (Signed)
Patient vomited 200 cc  of  clear gray fluid at this time.

## 2016-09-18 NOTE — Progress Notes (Addendum)
[facility]    Date of Admission:  09/16/2016   Total days of antibiotics 2        Day 2 triumeq           ID: Hector [name] is a 31 y.o. male with HIV disease, shizoaffective disorder with fever x 3 days Principal Problem:   Pneumonia, pneumocystis (HCC) Active Problems:   Schizoaffective disorder, bipolar type (HCC)   Human immunodeficiency virus I infection (HCC)    Subjective: Felt poorly overnight having a few episodes of emesis. No fever.  Medications:  . abacavir-dolutegravir-lamiVUDine  1 tablet Oral Daily  . enoxaparin (LOVENOX) injection  40 mg Subcutaneous Daily  . pantoprazole  40 mg Oral Daily  . pneumococcal 23 valent vaccine  0.5 mL Intramuscular Tomorrow-1000  . sodium chloride flush  3 mL Intravenous Q12H    Objective: Vital signs in last 24 hours: Temp:  [97.5 F (36.4 C)-98.8 F (37.1 C)] 98.8 F (37.1 C) (04/29 0548) Pulse Rate:  [64-80] 80 (04/29 0548) Resp:  [17-18] 17 (04/29 0548) BP: (138-142)/(70-100) 138/94 (04/29 0548) SpO2:  [100 %] 100 % (04/29 0548) Physical Exam  Constitutional: He is oriented to person, place, and time. He appears well-developed and well-nourished. No distress.  HENT:  Mouth/Throat: Oropharynx is clear and moist. No oropharyngeal exudate.  Cardiovascular: Normal rate, regular rhythm and normal heart sounds. Exam reveals no gallop and no friction rub.  No murmur heard.  Pulmonary/Chest: Effort normal and breath sounds normal. No respiratory distress. He has no wheezes.  Abdominal: Soft. Bowel sounds are normal. He exhibits no distension. There is no tenderness.  Lymphadenopathy:  He has no cervical adenopathy.  Neurological: He is alert and oriented to person, place, and time.  Skin: Skin is warm and dry. No rash noted. No erythema.  Psychiatric: He has a normal mood and affect. His behavior is normal.     Lab Results  Recent Labs  09/17/16 0058 09/18/16 0454  WBC 16.2* 10.6*  HGB  12.4* 11.7*  HCT 36.3* 34.8*  NA 134* 133*  K 3.1* 3.4*  CL 101 100*  CO2 26 27  BUN 7 8  CREATININE 0.89 0.97   Liver Panel  Recent Labs  09/17/16 0058  PROT 8.8*  ALBUMIN 3.3*  AST 20  ALT 25  ALKPHOS 62  BILITOT 0.7    Microbiology: 4/27 blood cx ngtd 4/28 urine cx ngtd Studies/Results: Dg Chest 2 View  Result Date: 09/16/2016 CLINICAL DATA:  Chest pain since yesterday.  Fevers. EXAM: CHEST  2 VIEW COMPARISON:  09/15/2016 FINDINGS: There is hazy airspace disease in the medial right lower lobe concerning for atelectasis versus pneumonia. There is no pleural effusion or pneumothorax. The heart and mediastinal contours are unremarkable. The osseous structures are unremarkable. IMPRESSION: Hazy airspace disease in the medial right lower lobe concerning for atelectasis versus pneumonia. Electronically Signed   By: Elige Ko   On: 09/16/2016 16:40   Ct Angio Chest/abd/pel For Dissection W And/or Wo Contrast  Result Date: 09/16/2016 CLINICAL DATA:  Chest pain, dyspnea and leg numbness x2 days EXAM: CT ANGIOGRAPHY CHEST, ABDOMEN AND PELVIS TECHNIQUE: Multidetector CT imaging through the chest, abdomen and pelvis was performed using the standard protocol during bolus administration of intravenous contrast. Multiplanar reconstructed images and MIPs were obtained and reviewed to evaluate the vascular anatomy. CONTRAST:  100 cc Isovue-300 IV COMPARISON:  None. FINDINGS: CTA CHEST FINDINGS Cardiovascular: Normal branch pattern of the great vessels. No aortic  aneurysm or dissection. Cardiac motion artifacts are seen along portions of the main pulmonary artery and ascending aorta. No large central pulmonary embolus. Mediastinum/Nodes: Mild enlargement of the axillary lymph nodes up to 15 mm short axis possibly reactive in etiology. No mediastinal or hilar adenopathy. The main stem bronchi and trachea are patent. No esophageal abnormality. The thyroid gland is nonsuspicious. Lungs/Pleura:  Motion related artifacts are noted. No pneumonic consolidation, effusion or pneumothorax is seen. There is bibasilar dependent atelectasis. Musculoskeletal: No focal disc herniation, fracture nor bone destruction. Review of the MIP images confirms the above findings. CTA ABDOMEN AND PELVIS FINDINGS VASCULAR Aorta: Normal caliber aorta without aneurysm, dissection, vasculitis or significant stenosis. Celiac: Patent without evidence of aneurysm, dissection, vasculitis or significant stenosis. SMA: Patent without evidence of aneurysm, dissection, vasculitis or significant stenosis. Renals: Patent single renal arteries to both kidneys. No evidence of aneurysm or dissection. IMA: Patent Inflow: Patent without evidence of aneurysm, dissection, vasculitis or significant stenosis. Veins: No obvious venous abnormality within the limitations of this arterial phase study. Review of the MIP images confirms the above findings. NON-VASCULAR Hepatobiliary: No focal liver abnormality is seen. No gallstones, gallbladder wall thickening, or biliary dilatation. Pancreas: Unremarkable. No pancreatic ductal dilatation or surrounding inflammatory changes. Spleen: Normal in size without focal abnormality. Adrenals/Urinary Tract: Adrenal glands are unremarkable. Kidneys are normal, without renal calculi, focal lesion, or hydronephrosis. Bladder is unremarkable. Stomach/Bowel: Stomach is within normal limits. Appendix appears normal. No evidence of bowel wall thickening, distention, or inflammatory changes. Lymphatic: No pelvic, inguinal, mesenteric, retroperitoneal or abdominal adenopathy. Reproductive: Prostate is unremarkable. Other: No abdominal wall hernia or abnormality. No abdominopelvic ascites. Musculoskeletal: No acute or significant osseous findings. Review of the MIP images confirms the above findings. IMPRESSION: 1. No aortic aneurysm, dissection or pulmonary embolus noted. 2. No acute pulmonary abnormality.  Bibasilar  dependent atelectasis. 3. Unremarkable CT of the abdomen and pelvis. Electronically Signed   By: Tollie Eth M.D.   On: 09/16/2016 20:02     Assessment/Plan: Gastroenteritis = would treat with scheduled zofran to see if it helps his symptoms. Suspect this would be viral etiology. Will give zofran  odt x 1 to see if helps sx  Fever = fever curve improving  hiv disease= continue on triumeq. Viral load and cd 4 count is pending.  Drue Second [facility] Cell: [phone] Pager: [phone]  09/18/2016, 10:04 AM

## 2016-09-19 DIAGNOSIS — R112 Nausea with vomiting, unspecified: Secondary | ICD-10-CM

## 2016-09-19 LAB — CD4/CD8 (T-HELPER/T-SUPPRESSOR CELL)
CD4 absolute: 470 /uL — ABNORMAL LOW (ref 500–1900)
CD4%: 24 % — AB (ref 30.0–60.0)
CD8 T CELL ABS: 740 /uL (ref 230–1000)
CD8tox: 38 % (ref 15.0–40.0)
RATIO: 0.6 — AB (ref 1.0–3.0)
Total lymphocyte count: 1960 /uL (ref 1000–4000)

## 2016-09-19 LAB — HIV-1 RNA QUANT-NO REFLEX-BLD
HIV 1 RNA Quant: <20 copies/mL
LOG10 HIV-1 RNA: UNDETERMINED {Log_copies}/mL

## 2016-09-19 NOTE — Progress Notes (Signed)
Internal Medicine Attending:   I saw and examined the patient. I reviewed the resident's note and I agree with the resident's findings and plan as documented in the resident's note.  Patient feels well this morning with no new complaints. He states that he is tolerating oral intake without any further nausea or vomiting and has remained afebrile. He states that he feels like he is back to his baseline and would like to go home today. CD4 count was 470 making an opportunistic infection less likely. His respiratory viral panel was negative and his leukocytosis has improved. ID follow-up and recommendations appreciated. Continue Triumeq for his HIV. No further workup for now. Patient stable for discharge home today.

## 2016-09-19 NOTE — Progress Notes (Signed)
   Subjective: Patient feels well this morning. His symptoms have resolved and he feels back to his baseline. He is tolerating PO without nausea and vomiting. Eager for discharge.   Objective:  Vital signs in last 24 hours: Vitals:   09/18/16 0548 09/18/16 1040 09/18/16 2241 09/19/16 0600  BP: (!) 138/94 140/85 139/67 127/67  Pulse: 80 84 71 68  Resp: Temp: 98.8 F (37.1 C) 98.7 F (37.1 C) 98.8 F (37.1 C) 98.5 F (36.9 C)  TempSrc: Oral Oral Oral Oral  SpO2: 100%  100% 100%  Weight:   264 lb 8.8 oz (120 kg)   Height:       Physical Exam Constitutional: Well appearing, NAD, appears comfortable Cardiovascular: RRR, no murmurs, rubs, or gallops.  Pulmonary/Chest: CTAB, no wheezes, rales, or rhonchi.   Abdominal: Soft, non tender, non distended. +BS.  Extremities: Warm and well perfused. No edema.  Neurological: A&Ox3, CN II - XII grossly intact.  Skin: No rashes or erythema  Psychiatric: Normal mood and affect  Assessment/Plan:  Patient is a 31 year old African-American male with a past medical history significant for HIV (recently restarted antiretroviral viral therapy in March of this year), hypertension, and schizoaffective disorder/bipolar disorder, who presented with chest pain, fever, and generalized malaise x 3 days.   Febrile Illness: In the setting of HIV and constitutional symptoms of nausea, vomiting, chills, and malaise - now resolved. Likely viral gastroenteritis. Afebrile now 48 hours. N/V has improved, he is tolerating PO without issue.  -- ID following, appreciate recommendations  -- Respiratory Viral Panel >> negative -- F/u CD4 count, HIV RNA -- Continue Triumeq  -- Oxy 5 mg q4 prn for pain  -- Add Voltaren gel for chest wall pain -- Phenergan prn  -- Protonix 40 mg daily -- GI cocktail prn  Hx of Psychiatric Illness: Patient not currently taking medication as his illness is stable and not interfering with daily activities. -- Continue to  follow   Dispo: Anticipated discharge today.   Reymundo Poll, MD 09/19/2016, 8:52 AM Pager: [phone]

## 2016-09-19 NOTE — Progress Notes (Signed)
Pt given discharge instructions and follow up info. Denies questions. Mom to drive patient home.

## 2016-09-19 NOTE — Discharge Summary (Signed)
Name: Hector [name] MRN: [id number] DOB: 03-08-1986 31 y.o. PCP: No Pcp Per Patient  Date of Admission: 09/16/2016  3:56 PM Date of Discharge: 09/19/2016 Attending Physician: Earl Lagos, MD  Discharge Diagnosis: 1. Febrile Illness  2. HIV  Discharge Medications: Allergies as of 09/19/2016      Reactions   Tomato Anaphylaxis, Rash   Risperdal [risperidone] Nausea And Vomiting   Aspirin Itching      Medication List    TAKE these medications   abacavir-dolutegravir-lamiVUDine 600-50-300 MG tablet Commonly known as:  TRIUMEQ Take 1 tablet by mouth daily.       Disposition and follow-up:   Mr.Kern Theresa Mulligan was discharged from [facility] in Stable condition.  At the hospital follow up visit please address:  1.  Febrile illness: In the setting of HIV and constitutional symptoms of nausea, vomiting, and poor PO intake after restarting ART 1 month prior. Symptoms improved rapidly and patient was afebrile 48 hours prior to discharge. Symptoms were felt to be viral in etiology.   2.  Labs / imaging needed at time of follow-up: None   3.  Pending labs/ test needing follow-up: None  Follow-up Appointments: Follow-up Information    [facility]. Go on 10/03/2016.   Why:  at 10:45am for hospital follow up. Please arrive 15 minutes early. Our clinic is located on the ground floor of the hospital in the east wing past the cafeteria. Please bring your medications.  Contact information: [address] [place] [address] [phone]          Hospital Course by problem list:  1. Febrile Illness: Patient is a 31 year old obese male with a past medical history of hypertension, schizoaffective bipolar disorder, and HIV who presented to the ED on 09/16/2016 with chest pain and constitutional symptoms. He describes the chest pain as central without radiation, worse with deep inspiration, and associated with nausea, vomiting, and  poor PO intake. He also endorses headache, fever, chills, general malaise, and fatigue. He was evaluated in the ED the day prior to admission and sent home with supportive care. He returned the following day with ongoing complaints of shortness of breath and dyspnea. Of note, he had recently been restarted on antiretroviral thearpy just 1 month prior. He was previously on truvada-DRVr for his HIV and following with a ID physician in [place], [place]. He was off antiretroviral therapy for roughly 3 months then reestablished care with Dr. Ninetta Lights on 07/27/2016 and was restarted on triumeq.  On arrival to the ED he was tachycardic, hypertensive, and febrile to 101.5. Laboratory evaluation was significant for a leukocytosis of 16.6. EKG was nonischemic and troponins were negative. CTA chest was negative for evidence of pulmonary embolism or dissection. Chest x-ray on admission demonstrated hazy airspace disease in the medial right lower lobe concerning for atelectasis versus a developing pneumonia. In the setting of recently restarting antiretrovirals, there were some concern for immune reconstitution syndrome as well as PCP pneumonia given the infiltrate on CXR (However, CTA chest was negative). Patient was admitted and started on IV Bactrim and IVFs. Symptoms rapidly improved the next day. Infectious disease evaluated the patient the following day who did not feel his presentation was consistent with PCP pneumonia or immune reconstitution syndrome. Bactrim was discontinued. Symptoms resolved and he was afebrile 48 hours prior to discharge. Given his lymphocyte predominance on differential and rapid improvement in symptoms, etiology was felt to be viral. He was discharged and instructed  to follow up in [facility] on 5/14.    Discharge Vitals:   BP 130/69 (BP Location: Left Arm)   Pulse 68   Temp 98.3 F (36.8 C) (Oral)   Resp 18   Ht  (1.88 m)   Wt 264 lb 8.8 oz (120 kg)   SpO2 100%   BMI 33.97 kg/m     Pertinent Labs, Studies, and Procedures:   09/15/2016 CXR 2 View: IMPRESSION: No radiographic evidence of acute cardiopulmonary disease  09/16/2016 CXR 2 View: IMPRESSION: Hazy airspace disease in the medial right lower lobe concerning for atelectasis versus pneumonia.  09/16/2016 CTA Chest Ab/Pelvis:  IMPRESSION: 1. No aortic aneurysm, dissection or pulmonary embolus noted. 2. No acute pulmonary abnormality.  Bibasilar dependent atelectasis. 3. Unremarkable CT of the abdomen and pelvis.  Discharge Instructions: Discharge Instructions    Call MD for:  persistant dizziness or light-headedness    Complete by:  As directed    Call MD for:  persistant nausea and vomiting    Complete by:  As directed    Call MD for:  temperature >100.4    Complete by:  As directed    Diet - low sodium heart healthy    Complete by:  As directed    Discharge instructions    Complete by:  As directed    [name],  It was a pleasure taking care of you. I am glad you are feeling better. Please continue to take your Truimeq as previously prescribed and follow up with your infectious disease doctor for your scheduled appointment in June. I have made you an appointment in the Internal Medicine clinic on May 14th for hospital follow up. Please keep this appointment. Our clinic is located on the ground floor of the hospital in the east wing. Please arrive 15 minutes early and bring your medications. If you have any questions or concerns, call our clinic at [phone] or after hours call [phone] and ask for the internal medicine resident on call. Thank you!  - Dr. Antony Contras   Increase activity slowly    Complete by:  As directed       Signed: Reymundo Poll, MD 09/19/2016, 9:25 AM   Pager: [phone]

## 2016-09-21 LAB — CULTURE, BLOOD (ROUTINE X 2)
CULTURE: NO GROWTH
Culture: NO GROWTH
Special Requests: ADEQUATE

## 2016-09-27 MED FILL — TRIUMEQ 600-50-300 MG TABS: 600-50-300 | 30 days supply | Qty: 30 | Fill #2

## 2016-10-03 ENCOUNTER — Ambulatory Visit: Payer: Self-pay

## 2016-10-12 ENCOUNTER — Other Ambulatory Visit: Payer: Self-pay

## 2016-10-26 ENCOUNTER — Ambulatory Visit: Payer: Self-pay | Admitting: Infectious Diseases

## 2016-11-25 MED FILL — TRIUMEQ 600-50-300 MG TABS: 600-50-300 | 30 days supply | Qty: 30 | Fill #3

## 2016-11-30 ENCOUNTER — Ambulatory Visit: Payer: Self-pay

## 2016-12-21 MED FILL — TRIUMEQ 600-50-300 MG TABS: 600-50-300 | 30 days supply | Qty: 30 | Fill #4

## 2016-12-23 ENCOUNTER — Telehealth: Payer: Self-pay | Admitting: Pharmacist Clinician (PhC)/ Clinical Pharmacy Specialist

## 2016-12-23 NOTE — Telephone Encounter (Signed)
Left VM to call back for appt and side effects issue.

## 2017-03-06 ENCOUNTER — Ambulatory Visit (HOSPITAL_COMMUNITY)
Admission: EM | Admit: 2017-03-06 | Discharge: 2017-03-06 | Disposition: A | Discharge status: Home / Self Care | Payer: Medicare Other

## 2017-03-06 ENCOUNTER — Encounter (HOSPITAL_COMMUNITY): Payer: Self-pay | Admitting: *Deleted

## 2017-03-06 NOTE — ED Notes (Signed)
Spoke with Dr Chaney Malling about patients reason for visit. Will call poison control.

## 2017-03-06 NOTE — ED Notes (Signed)
Called [phone], poison control.  Spoke to [place], Charity fundraiser.  Instructed that patient be sent to nearest emergency department.  States if patient upset at time of taking medicines, he may not be certain of what he took.  Also with complaints of headache, nausea, these would be indicative of toxic overdosing 2 days ago.  Notified dr Chaney Malling and Efraim Kaufmann ad of instructions and all agreeable to plan.  Friend and patient agreeable to going to ed now.

## 2017-03-06 NOTE — ED Triage Notes (Addendum)
Patient states that he has had intermittent headaches for a couple months. States they come and go, patient has had BP checked and has been elevated.   Patient also reports dry mouth.   Patient also took  x 10 tablets of tylenol 2 days ago. Patient did call poison control and was told to be seen. Patient reports he feels mildly nauseas still but did not vomit. Patient denies any SI at this time, states he was upset due to fight with significant other. Patient states after taking the medication he did feel "out of it" and not acting right.  Patient and I talked about the importance of never taking that many pills again.

## 2017-03-13 ENCOUNTER — Emergency Department (HOSPITAL_COMMUNITY)
Admission: EM | Admit: 2017-03-13 | Discharge: 2017-03-13 | Disposition: A | Discharge status: Home / Self Care | Payer: Medicare Other | Attending: Emergency Medicine | Admitting: Emergency Medicine

## 2017-03-13 ENCOUNTER — Emergency Department (HOSPITAL_COMMUNITY): Payer: Medicare Other

## 2017-03-13 ENCOUNTER — Other Ambulatory Visit: Payer: Self-pay

## 2017-03-13 ENCOUNTER — Encounter (HOSPITAL_COMMUNITY): Payer: Self-pay | Admitting: *Deleted

## 2017-03-13 DIAGNOSIS — F4321 Adjustment disorder with depressed mood: Secondary | ICD-10-CM | POA: Diagnosis not present

## 2017-03-13 DIAGNOSIS — I252 Old myocardial infarction: Secondary | ICD-10-CM | POA: Insufficient documentation

## 2017-03-13 DIAGNOSIS — R112 Nausea with vomiting, unspecified: Secondary | ICD-10-CM | POA: Diagnosis not present

## 2017-03-13 DIAGNOSIS — I10 Essential (primary) hypertension: Secondary | ICD-10-CM | POA: Diagnosis not present

## 2017-03-13 DIAGNOSIS — B20 Human immunodeficiency virus [HIV] disease: Secondary | ICD-10-CM | POA: Insufficient documentation

## 2017-03-13 DIAGNOSIS — Z79899 Other long term (current) drug therapy: Secondary | ICD-10-CM | POA: Insufficient documentation

## 2017-03-13 DIAGNOSIS — R079 Chest pain, unspecified: Secondary | ICD-10-CM | POA: Insufficient documentation

## 2017-03-13 DIAGNOSIS — F419 Anxiety disorder, unspecified: Secondary | ICD-10-CM | POA: Insufficient documentation

## 2017-03-13 DIAGNOSIS — R531 Weakness: Secondary | ICD-10-CM | POA: Insufficient documentation

## 2017-03-13 DIAGNOSIS — R0602 Shortness of breath: Secondary | ICD-10-CM | POA: Diagnosis not present

## 2017-03-13 LAB — URINALYSIS, ROUTINE W REFLEX MICROSCOPIC
BILIRUBIN URINE: NEGATIVE
Bacteria, UA: NONE SEEN
Glucose, UA: NEGATIVE mg/dL
Ketones, ur: NEGATIVE mg/dL
LEUKOCYTES UA: NEGATIVE
NITRITE: NEGATIVE
PH: 5 (ref 5.0–8.0)
Protein, ur: 30 mg/dL — AB
SPECIFIC GRAVITY, URINE: 1.027 (ref 1.005–1.030)

## 2017-03-13 LAB — I-STAT TROPONIN, ED: TROPONIN I, POC: 0 ng/mL (ref 0.00–0.08)

## 2017-03-13 LAB — CBC
HCT: 40.1 % (ref 39.0–52.0)
HEMOGLOBIN: 13.9 g/dL (ref 13.0–17.0)
MCH: 29.3 pg (ref 26.0–34.0)
MCHC: 34.7 g/dL (ref 30.0–36.0)
MCV: 84.6 fL (ref 78.0–100.0)
Platelets: 251 10*3/uL (ref 150–400)
RBC: 4.74 MIL/uL (ref 4.22–5.81)
RDW: 12.5 % (ref 11.5–15.5)
WBC: 8.2 10*3/uL (ref 4.0–10.5)

## 2017-03-13 LAB — BASIC METABOLIC PANEL
ANION GAP: 5 (ref 5–15)
BUN: 14 mg/dL (ref 6–20)
CALCIUM: 9.3 mg/dL (ref 8.9–10.3)
CO2: 28 mmol/L (ref 22–32)
Chloride: 102 mmol/L (ref 101–111)
Creatinine, Ser: 1.02 mg/dL (ref 0.61–1.24)
GLUCOSE: 149 mg/dL — AB (ref 65–99)
POTASSIUM: 3.6 mmol/L (ref 3.5–5.1)
SODIUM: 135 mmol/L (ref 135–145)

## 2017-03-13 MED ORDER — LORAZEPAM 1 MG PO TABS: 1.0000 mg | ORAL_TABLET | Freq: Four times a day (QID) | ORAL | 0 refills | Status: DC | PRN

## 2017-03-13 MED ORDER — LORAZEPAM 2 MG PO TABS: 1.0000 mg | ORAL_TABLET | Freq: Four times a day (QID) | ORAL | 0 refills | Status: DC | PRN

## 2017-03-13 NOTE — Discharge Instructions (Signed)
Please read instructions below.   Follow up with your primary care provider/infectious disease provider about your visit today. You can take ativan every 6 hours as needed for anxiety. Return to the ER for new or worsening symptoms; including worsening chest pain, shortness of breath, pain that radiates to the arm or neck, pain or shortness of breath worsened with exertion.

## 2017-03-13 NOTE — ED Triage Notes (Signed)
Pt reports having left side chest pain and sob. Had recent family member die and having very high stress level and depression. Has decreased appetite and n/v. Denies SI or HI.

## 2017-03-13 NOTE — ED Provider Notes (Signed)
[facility] Provider Note   CSN: [id number] Arrival date & time: 03/13/17  1439     History   Chief Complaint Chief Complaint  Patient presents with  . Chest Pain    HPI Hector [name] is a 31 y.o. male w PMHx HIV, hypertension, MI in 2017, neurosyphilis, hepatitis B, schizoaffective disorder, 90 the ED with complaints x2 weeks. Patient's initial complaint in triage was intermittent stabbing left-sided chest pain that occurs at rest which has been occurring for 2 weeks. Patient also complaining of generalized weakness, decreased appetite, nausea and vomiting. He reports that his mother just passed away 2 days ago and he has been grieving with more anxiety. Denies SI or HI. He denies fever or chills, cough, congestion, ear pain, abdominal pain, shortness of breath, palpitations, lower extremity pain or edema.  He also states he spoke with his ID doctor, Dr. Ria Clockhatcher, who recommended he report the ED to be evaluated. Per chart review, CD4 in April 2018 was 470.  The history is provided by the patient.    Past Medical History:  Diagnosis Date  . Depression   . Headache   . History of fractured vertebra april 8th Diagnosed   Larey SeatFell out of a tree while climbing it.  . Human immunodeficiency virus I infection (HCC) 2016  . Hypertension   . Mental disorder   . Myocardial infarction [facility](HCC)     Patient Active Problem List   Diagnosis Date Noted  . Febrile illness 09/16/2016  . Hx of neurosyphilis 09/16/2016  . Essential hypertension 09/16/2016  . Human immunodeficiency virus I infection (HCC) 07/20/2016  . Hepatitis B immune 07/18/2016  . Non-compliance with treatment 10/06/2011  . Insomnia related to another mental disorder 10/04/2011  . Schizoaffective disorder, bipolar type (HCC) 10/03/2011    Class: Acute    Past Surgical History:  Procedure Laterality Date  . NO PAST SURGERIES         Home Medications    Prior to Admission  medications   Medication Sig Start Date End Date Taking? Authorizing Provider  abacavir-dolutegravir-lamiVUDine (TRIUMEQ) 600-50-300 MG tablet Take 1 tablet by mouth daily. 07/27/16   Ginnie SmartHatcher, Jeffrey C, MD  LORazepam (ATIVAN) 1 MG tablet Take 1 tablet (1 mg total) by mouth every 6 (six) hours as needed for anxiety. 03/13/17   Russo, [place] N, PA-C    Family History Family History  Problem Relation Age of Onset  . CAD Mother 6156       MI    Social History Social History  Substance Use Topics  . Smoking status: Never Smoker  . Smokeless tobacco: Never Used  . Alcohol use No     Allergies   Tomato; Risperdal [risperidone]; and Aspirin   Review of Systems Review of Systems  Constitutional: Positive for appetite change. Negative for chills and fever.  HENT: Negative for congestion, ear pain and sore throat.   Respiratory: Negative for cough and shortness of breath.   Cardiovascular: Positive for chest pain. Negative for palpitations and leg swelling.  Gastrointestinal: Positive for nausea and vomiting. Negative for abdominal pain, constipation and diarrhea.  Genitourinary: Negative for dysuria, flank pain and frequency.       Dark urine  Allergic/Immunologic: Positive for immunocompromised state (HIV).  All other systems reviewed and are negative.    Physical Exam Updated Vital Signs BP (!) 144/81   Pulse 92   Temp 97.9 F (36.6 C) (Oral)   Resp 16   SpO2 98%  Physical Exam  Constitutional: He appears well-developed and well-nourished. No distress.  HENT:  Head: Normocephalic and atraumatic.  Right Ear: Hearing, tympanic membrane, external ear and ear canal normal.  Left Ear: Hearing, tympanic membrane, external ear and ear canal normal.  Nose: Nose normal.  Mouth/Throat: Uvula is midline and oropharynx is clear and moist. No trismus in the jaw. No uvula swelling.  Eyes: Pupils are equal, round, and reactive to light. Conjunctivae and EOM are normal.  Neck: Normal  range of motion. Neck supple. No tracheal deviation present.  Cardiovascular: Normal rate, regular rhythm, normal heart sounds and intact distal pulses.  Exam reveals no gallop and no friction rub.   No murmur heard. Pulmonary/Chest: Effort normal and breath sounds normal. No stridor. No respiratory distress. He has no wheezes. He has no rales. He exhibits no tenderness.  Abdominal: Soft. Bowel sounds are normal. He exhibits no distension. There is no tenderness. There is no rebound and no guarding.  Lymphadenopathy:    He has no cervical adenopathy.  Neurological: He is alert.  Skin: Skin is warm.  Psychiatric: His behavior is normal. His mood appears anxious.  Nursing note and vitals reviewed.    ED Treatments / Results  Labs (all labs ordered are listed, but only abnormal results are displayed) Labs Reviewed  BASIC METABOLIC PANEL - Abnormal; Notable for the following:       Result Value   Glucose, Bld 149 (*)    All other components within normal limits  URINALYSIS, ROUTINE W REFLEX MICROSCOPIC - Abnormal; Notable for the following:    Hgb urine dipstick MODERATE (*)    Protein, ur 30 (*)    Squamous Epithelial / LPF 0-5 (*)    All other components within normal limits  CBC  I-STAT TROPONIN, ED    EKG  EKG Interpretation  Date/Time:  Monday March 13 2017 14:37:55 EDT Ventricular Rate:  95 PR Interval:  140 QRS Duration: 84 QT Interval:  332 QTC Calculation: 417 R Axis:   85 Text Interpretation:  Normal sinus rhythm Normal ECG since last tracing no significant change Confirmed by Mancel Bale [phone]) on 03/13/2017 6:43:34 PM       Radiology Dg Chest 2 View  Result Date: 03/13/2017 CLINICAL DATA:  LEFT side chest pain and shortness of breath, high stress level at present and depression due to recent death in the family, history of hypertension, MI EXAM: CHEST  2 VIEW COMPARISON:  09/16/2016 FINDINGS: Normal heart size, mediastinal contours, and pulmonary  vascularity. Mildly decreased lung volumes with minimal bibasilar atelectasis. Lungs otherwise clear. No infiltrate, pleural effusion or pneumothorax. Bones unremarkable. IMPRESSION: Decreased lung volumes with minimal bibasilar atelectasis. Electronically Signed   By: Ulyses Southward M.D.   On: 03/13/2017 15:22    Procedures Procedures (including critical care time)  Medications Ordered in ED Medications - No data to display   Initial Impression / Assessment and Plan / ED Course  I have reviewed the triage vital signs and the nursing notes.  Pertinent labs & imaging results that were available during my care of the patient were reviewed by me and considered in my medical decision making (see chart for details).     Patient is to be discharged with recommendation to follow up with PCP/ID provider in regards to today's hospital visit. Chest pain is not likely of cardiac or pulmonary etiology d/t presentation, PERC negative, VSS, no tracheal deviation, no JVD or new murmur, RRR, breath sounds equal bilaterally, EKG without acute  abnormalities, negative troponin, and CXR neg for acute infiltrate. Heart Score 2. Remainder of exam reassuring. CBC nl, BMP unremarkable. Suspect pt with increased level of anxiety and symptoms assoc w grieving d/t his mother passing. Pt has been advised to return to the ED if CP becomes exertional, associated with diaphoresis or nausea, radiates to left jaw/arm, worsens or becomes concerning in any way. Pt appears reliable for follow up and is agreeable to discharge. Will send with some ativan for anxiety.  Case has been discussed with and seen by Dr. Effie Shy who agrees with the above plan to discharge.   Final Clinical Impressions(s) / ED Diagnoses   Final diagnoses:  Chest pain at rest  Anxiety  Grief    New Prescriptions Current Discharge Medication List    START taking these medications   Details  LORazepam (ATIVAN) 1 MG tablet Take 1 tablet (1 mg total) by  mouth every 6 (six) hours as needed for anxiety. Qty: 6 tablet, Refills: 0         Russo, [place] N, [place] 03/13/17 Brooke Pace    Mancel Bale, MD 03/14/17 [phone]

## 2017-03-13 NOTE — ED Provider Notes (Signed)
  Face-to-face evaluation   History: Here for evaluation of chest pain described as sharp in nature, being around in the left upper chest, but never going away for the last 2 days.  Also preceding pain for the last 2 weeks.  He is upset because his mother died 2 days ago.  No change of pain with ambulation, palpation, or deep breathing.  Physical exam: Alert, anxious, uncomfortable.  Regular rate and rhythm no murmur lungs clear anteriorly.  Chest nontender to palpation.  Medical screening examination/treatment/procedure(s) were conducted as a shared visit with non-physician practitioner(s) and myself.  I personally evaluated the patient during the encounter   Mancel BaleWentz, Jenette Rayson, MD 03/14/17 1043

## 2017-03-14 LAB — GLUCOSE, POCT (MANUAL RESULT ENTRY): POC Glucose: 95 mg/dl (ref 70–99)

## 2017-03-27 ENCOUNTER — Ambulatory Visit: Payer: Self-pay

## 2017-03-28 ENCOUNTER — Ambulatory Visit (INDEPENDENT_AMBULATORY_CARE_PROVIDER_SITE_OTHER): Payer: Medicare Other | Admitting: Pharmacist Clinician (PhC)/ Clinical Pharmacy Specialist

## 2017-03-28 DIAGNOSIS — B20 Human immunodeficiency virus [HIV] disease: Secondary | ICD-10-CM | POA: Diagnosis not present

## 2017-03-28 DIAGNOSIS — Z23 Encounter for immunization: Secondary | ICD-10-CM | POA: Diagnosis not present

## 2017-03-28 MED ORDER — ABACAVIR-DOLUTEGRAVIR-LAMIVUD 600-50-300 MG PO TABS: 1.0000 | ORAL_TABLET | Freq: Every day | ORAL | 1 refills | Status: DC

## 2017-03-28 MED FILL — TRIUMEQ 600-50-300 MG TABS: 600-50-300 | 30 days supply | Qty: 30 | Fill #5
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
## 2017-03-29 LAB — T-HELPER CELL (CD4) - (RCID CLINIC ONLY)
CD4 T CELL ABS: 610 /uL (ref 400–2700)
CD4 T CELL HELPER: 24 % — AB (ref 33–55)

## 2017-04-07 LAB — HIV RNA, RTPCR W/R GT (RTI, PI,INT)
HIV 1 RNA QUANT: 31600 {copies}/mL — AB
HIV-1 RNA QUANT, LOG: 4.5 {Log_copies}/mL — AB

## 2017-04-07 LAB — HIV-1 INTEGRASE GENOTYPE

## 2017-04-07 LAB — HIV-1 GENOTYPE: HIV-1 Genotype: DETECTED — AB

## 2017-05-08 ENCOUNTER — Ambulatory Visit: Payer: Self-pay

## 2017-05-09 MED FILL — TRIUMEQ 600-50-300 MG TABS: 600-50-300 | 30 days supply | Qty: 30 | Fill #6

## 2017-06-06 ENCOUNTER — Telehealth: Payer: Self-pay

## 2017-06-06 NOTE — Telephone Encounter (Signed)
Barbara CowerJason with [facility] called to say this patient was listed as a contact for syphilis.  Barbara CowerJason was advised if patient calls [facility].    Laurell Josephsammy K [name], RN

## 2017-06-20 MED FILL — TRIUMEQ 600-50-300 MG TABS: 600-50-300 | 30 days supply | Qty: 30 | Fill #7

## 2017-06-21 ENCOUNTER — Ambulatory Visit: Payer: Self-pay

## 2017-08-03 ENCOUNTER — Telehealth: Payer: Self-pay | Admitting: Pharmacist Clinician (PhC)/ Clinical Pharmacy Specialist

## 2017-08-03 ENCOUNTER — Other Ambulatory Visit: Payer: Self-pay | Admitting: Pharmacist

## 2017-08-03 DIAGNOSIS — B20 Human immunodeficiency virus [HIV] disease: Secondary | ICD-10-CM

## 2017-08-03 MED ORDER — ABACAVIR-DOLUTEGRAVIR-LAMIVUD 600-50-300 MG PO TABS: 1.0000 | ORAL_TABLET | Freq: Every day | ORAL | 0 refills | Status: DC

## 2017-08-03 MED FILL — TRIUMEQ 600-50-300 MG TABS: 600-50-300 | 30 days supply | Qty: 30 | Fill #0

## 2017-08-03 NOTE — Telephone Encounter (Signed)
Hector [name] has missed several appts with us. He said that he has been dealing with death in the family. Claimed to be taking meds. He is out of refills right now. Told him that he must make the appt with pharmacy next week for more refills.

## 2017-08-08 ENCOUNTER — Ambulatory Visit: Payer: Self-pay

## 2017-10-05 ENCOUNTER — Emergency Department (HOSPITAL_COMMUNITY)
Admission: EM | Admit: 2017-10-05 | Discharge: 2017-10-05 | Disposition: A | Discharge status: Home / Self Care | Payer: Medicare Other | Attending: Emergency Medicine | Admitting: Emergency Medicine

## 2017-10-05 ENCOUNTER — Encounter (HOSPITAL_COMMUNITY): Payer: Self-pay

## 2017-10-05 DIAGNOSIS — Z79899 Other long term (current) drug therapy: Secondary | ICD-10-CM | POA: Insufficient documentation

## 2017-10-05 DIAGNOSIS — R197 Diarrhea, unspecified: Secondary | ICD-10-CM | POA: Diagnosis not present

## 2017-10-05 DIAGNOSIS — R1111 Vomiting without nausea: Secondary | ICD-10-CM | POA: Diagnosis not present

## 2017-10-05 DIAGNOSIS — B20 Human immunodeficiency virus [HIV] disease: Secondary | ICD-10-CM | POA: Insufficient documentation

## 2017-10-05 DIAGNOSIS — R112 Nausea with vomiting, unspecified: Secondary | ICD-10-CM | POA: Insufficient documentation

## 2017-10-05 DIAGNOSIS — I10 Essential (primary) hypertension: Secondary | ICD-10-CM | POA: Diagnosis not present

## 2017-10-05 LAB — COMPREHENSIVE METABOLIC PANEL
ALBUMIN: 4 g/dL (ref 3.5–5.0)
ALT: 37 U/L (ref 17–63)
AST: 31 U/L (ref 15–41)
Alkaline Phosphatase: 68 U/L (ref 38–126)
Anion gap: 11 (ref 5–15)
BILIRUBIN TOTAL: 0.8 mg/dL (ref 0.3–1.2)
BUN: 16 mg/dL (ref 6–20)
CHLORIDE: 104 mmol/L (ref 101–111)
CO2: 26 mmol/L (ref 22–32)
Calcium: 9.2 mg/dL (ref 8.9–10.3)
Creatinine, Ser: 0.84 mg/dL (ref 0.61–1.24)
GFR calc Af Amer: >60 mL/min (ref >60)
GFR calc non Af Amer: >60 mL/min (ref >60)
GLUCOSE: 110 mg/dL — AB (ref 65–99)
POTASSIUM: 3.8 mmol/L (ref 3.5–5.1)
Sodium: 141 mmol/L (ref 135–145)
Total Protein: 9 g/dL — ABNORMAL HIGH (ref 6.5–8.1)

## 2017-10-05 LAB — CBC WITH DIFFERENTIAL/PLATELET
BASOS ABS: 0 10*3/uL (ref 0.0–0.1)
BASOS PCT: 0 %
Eosinophils Absolute: 0.2 10*3/uL (ref 0.0–0.7)
Eosinophils Relative: 2 %
HEMATOCRIT: 42 % (ref 39.0–52.0)
Hemoglobin: 14.9 g/dL (ref 13.0–17.0)
Lymphocytes Relative: 10 %
Lymphs Abs: 1.1 10*3/uL (ref 0.7–4.0)
MCH: 30.3 pg (ref 26.0–34.0)
MCHC: 35.5 g/dL (ref 30.0–36.0)
MCV: 85.4 fL (ref 78.0–100.0)
Monocytes Absolute: 0.8 10*3/uL (ref 0.1–1.0)
Monocytes Relative: 7 %
NEUTROS ABS: 8.6 10*3/uL — AB (ref 1.7–7.7)
NEUTROS PCT: 81 %
Platelets: 197 10*3/uL (ref 150–400)
RBC: 4.92 MIL/uL (ref 4.22–5.81)
RDW: 12.6 % (ref 11.5–15.5)
WBC: 10.6 10*3/uL — AB (ref 4.0–10.5)

## 2017-10-05 LAB — LIPASE, BLOOD: Lipase: 38 U/L (ref 11–51)

## 2017-10-05 MED ORDER — ONDANSETRON HCL 4 MG/2ML IJ SOLN
4.0000 mg | Freq: Once | INTRAMUSCULAR | Status: AC
  Administered 2017-10-05: 4 mg via INTRAVENOUS
  Filled 2017-10-05: qty 2

## 2017-10-05 MED ORDER — PANTOPRAZOLE SODIUM 40 MG PO TBEC
40.0000 mg | DELAYED_RELEASE_TABLET | Freq: Once | ORAL | Status: AC
  Administered 2017-10-05: 40 mg via ORAL
  Filled 2017-10-05: qty 1

## 2017-10-05 MED ORDER — LOPERAMIDE HCL 2 MG PO CAPS
4.0000 mg | ORAL_CAPSULE | Freq: Once | ORAL | Status: AC
  Administered 2017-10-05: 4 mg via ORAL
  Filled 2017-10-05: qty 2

## 2017-10-05 MED ORDER — PANTOPRAZOLE SODIUM 40 MG PO TBEC: 40.0000 mg | DELAYED_RELEASE_TABLET | Freq: Every day | ORAL | 1 refills | Status: DC

## 2017-10-05 MED ORDER — ACETAMINOPHEN 500 MG PO TABS
1000.0000 mg | ORAL_TABLET | Freq: Once | ORAL | Status: AC
  Administered 2017-10-05: 1000 mg via ORAL
  Filled 2017-10-05: qty 2

## 2017-10-05 MED ORDER — SODIUM CHLORIDE 0.9 % IV BOLUS
1000.0000 mL | Freq: Once | INTRAVENOUS | Status: AC
  Administered 2017-10-05: 1000 mL via INTRAVENOUS

## 2017-10-05 MED ORDER — ONDANSETRON 4 MG PO TBDP: 4.0000 mg | ORAL_TABLET | Freq: Four times a day (QID) | ORAL | 0 refills | Status: DC | PRN

## 2017-10-05 NOTE — ED Notes (Signed)
Patient states he saw clots in his vomit starting yesterday morning. Emesis began Sunday.

## 2017-10-05 NOTE — Discharge Instructions (Addendum)
You may use over-the-counter Tylenol 1000 mg every 6 hours as needed for fever and pain.  You may use over-the-counter Imodium as needed for diarrhea.

## 2017-10-05 NOTE — ED Triage Notes (Signed)
Patient BIB EMS for complaints of "vomiting bright red blood" 3x starting at 11am 5/15. Patient also complains of headache, dizziness and cough. Patient has a hx of hypertension.  Vitals BP 184/102 P 90 RR 16 CBG 111

## 2017-10-05 NOTE — ED Provider Notes (Signed)
TIME SEEN: 3:25 AM  CHIEF COMPLAINT: Vomiting and diarrhea  HPI: Patient is a 32 year old male with history of HIV with CD4 count of 610 and viral load of 31,000, 600 in November 2008 who presents to the emergency department with nausea, vomiting diarrhea.  No fever.  Reports he is having abdominal soreness from vomiting.  Symptoms started yesterday night.  Has seen some blood in his vomit.  Does not drink alcohol or take NSAIDs.  No history of GI bleed.  Not on blood thinners.  No bloody stool or melena.  ROS: See HPI Constitutional: no fever  Eyes: no drainage  ENT: no runny nose   Cardiovascular:  no chest pain  Resp: no SOB  GI: Vomiting and diarrhea GU: no dysuria Integumentary: no rash  Allergy: no hives  Musculoskeletal: no leg swelling  Neurological: no slurred speech ROS otherwise negative  PAST MEDICAL HISTORY/PAST SURGICAL HISTORY:  Past Medical History:  Diagnosis Date  . Depression   . Headache   . History of fractured vertebra april 8th Diagnosed   Larey Seat out of a tree while climbing it.  . Human immunodeficiency virus I infection (HCC) 2016  . Hypertension   . Mental disorder   . Myocardial infarction [facility])     MEDICATIONS:  Prior to Admission medications   Medication Sig Start Date End Date Taking? Authorizing Provider  abacavir-dolutegravir-lamiVUDine (TRIUMEQ) 600-50-300 MG tablet Take 1 tablet by mouth daily. 08/03/17   Ginnie Smart, MD  LORazepam (ATIVAN) 1 MG tablet Take 1 tablet (1 mg total) by mouth every 6 (six) hours as needed for anxiety. 03/13/17   Robinson, [place] N, PA-C    ALLERGIES:  Allergies  Allergen Reactions  . Tomato Anaphylaxis and Rash  . Risperdal [Risperidone] Nausea And Vomiting  . Aspirin Itching    SOCIAL HISTORY:  Social History   Tobacco Use  . Smoking status: Never Smoker  . Smokeless tobacco: Never Used  Substance Use Topics  . Alcohol use: No    FAMILY HISTORY: Family History  Problem Relation Age of Onset   . CAD Mother 72       MI    EXAM: BP 132/69 (BP Location: Left Arm)   Pulse 84   Temp 98.6 F (37 C) (Oral)   Resp 16   Ht  (1.854 m)   Wt 102.1 kg (225 lb)   SpO2 97%   BMI 29.69 kg/m  CONSTITUTIONAL: Alert and oriented and responds appropriately to questions. Well-appearing; well-nourished HEAD: Normocephalic EYES: Conjunctivae clear, pupils appear equal, EOMI ENT: normal nose; moist mucous membranes NECK: Supple, no meningismus, no nuchal rigidity, no LAD  CARD: RRR; S1 and S2 appreciated; no murmurs, no clicks, no rubs, no gallops RESP: Normal chest excursion without splinting or tachypnea; breath sounds clear and equal bilaterally; no wheezes, no rhonchi, no rales, no hypoxia or respiratory distress, speaking full sentences ABD/GI: Normal bowel sounds; non-distended; soft, non-tender, no rebound, no guarding, no peritoneal signs, no hepatosplenomegaly BACK:  The back appears normal and is non-tender to palpation, there is no CVA tenderness EXT: Normal ROM in all joints; non-tender to palpation; no edema; normal capillary refill; no cyanosis, no calf tenderness or swelling    SKIN: Normal color for age and race; warm; no rash NEURO: Moves all extremities equally PSYCH: The patient's mood and manner are appropriate. Grooming and personal hygiene are appropriate.  MEDICAL DECISION MAKING: Patient here with reports of vomiting and diarrhea.  He reports he has had some hematemesis.  Abdominal exam is benign.  No history of esophageal varices, GI bleed.  Nonalcoholic and does not use chronic NSAIDs.  Will obtain labs and treat symptomatically with IV fluids, Zofran, Imodium, Protonix.  I have low suspicion for life-threatening GI bleed.  Likely from Mallory-Weiss tears from vomiting.  Doubt esophageal varices.  Doubt perforated ulcer.  Doubt appendicitis, cholecystitis, pancreatitis, bowel obstruction, colitis based on his benign exam.  ED PROGRESS: Patient reports feeling  better.  Labs unremarkable.  Abdominal exam still benign.  Tolerating p.o. without difficulty.  Suspect viral gastritis and Mallory-Weiss tears.  I feel he is safe for discharge home.  Discharge with prescriptions for Zofran, Protonix and instructions to use Tylenol and Imodium as needed.  Discussed return precautions.  Recommend a bland diet and increase fluid intake.   At this time, I do not feel there is any life-threatening condition present. I have reviewed and discussed all results (EKG, imaging, lab, urine as appropriate) and exam findings with patient/family. I have reviewed nursing notes and appropriate previous records.  I feel the patient is safe to be discharged home without further emergent workup and can continue workup as an outpatient as needed. Discussed usual and customary return precautions. Patient/family verbalize understanding and are comfortable with this plan.  Outpatient follow-up has been provided if needed. All questions have been answered.      Nareg Breighner, Layla Maw, DO 10/05/17 [phone]

## 2017-10-11 ENCOUNTER — Telehealth: Payer: Self-pay

## 2017-10-11 NOTE — Telephone Encounter (Signed)
Called pt to schedule an appt at our office w/ Dr. Ninetta Lights or Pharmacy. Left a vm asking for pt to call us back to make an appt. Lorenso Courier, [place]

## 2017-11-06 ENCOUNTER — Emergency Department (HOSPITAL_COMMUNITY)
Admission: EM | Admit: 2017-11-06 | Discharge: 2017-11-06 | Disposition: A | Discharge status: Home / Self Care | Payer: Medicare Other | Attending: Emergency Medicine | Admitting: Emergency Medicine

## 2017-11-06 ENCOUNTER — Encounter (HOSPITAL_COMMUNITY): Payer: Self-pay

## 2017-11-06 ENCOUNTER — Other Ambulatory Visit: Payer: Self-pay

## 2017-11-06 ENCOUNTER — Emergency Department (HOSPITAL_COMMUNITY): Payer: Medicare Other

## 2017-11-06 DIAGNOSIS — M79605 Pain in left leg: Secondary | ICD-10-CM | POA: Diagnosis not present

## 2017-11-06 DIAGNOSIS — I10 Essential (primary) hypertension: Secondary | ICD-10-CM | POA: Diagnosis not present

## 2017-11-06 DIAGNOSIS — Z79899 Other long term (current) drug therapy: Secondary | ICD-10-CM | POA: Insufficient documentation

## 2017-11-06 DIAGNOSIS — M79662 Pain in left lower leg: Secondary | ICD-10-CM | POA: Diagnosis not present

## 2017-11-06 DIAGNOSIS — B20 Human immunodeficiency virus [HIV] disease: Secondary | ICD-10-CM | POA: Insufficient documentation

## 2017-11-06 DIAGNOSIS — R200 Anesthesia of skin: Secondary | ICD-10-CM | POA: Diagnosis not present

## 2017-11-06 DIAGNOSIS — M25572 Pain in left ankle and joints of left foot: Secondary | ICD-10-CM | POA: Diagnosis not present

## 2017-11-06 DIAGNOSIS — M7989 Other specified soft tissue disorders: Secondary | ICD-10-CM | POA: Diagnosis not present

## 2017-11-06 NOTE — ED Notes (Signed)
GAVE PATIENT A SPRITE PATIENT IS RESTING WITH CALL BELL IN REACH

## 2017-11-06 NOTE — ED Provider Notes (Addendum)
[facility]   CSN: [id number] Arrival date & time: 11/06/17  [id number]     History   Chief Complaint Chief Complaint  Patient presents with  . Ankle Pain    HPI Hector [name] is a 32 y.o. male with history of HIV infection, hypertension, depression, schizoaffective disorder presents for evaluation of acute onset, waxing and waning left lower leg pain.  He notes pain developed 5 days ago and is sharp and stabbing in nature, pain does not radiate, is localized to the anterior left distal lower extremity.  Pain worsens with ambulation and plantarflexion/dorsiflexion of the ankle.  He endorses numbness to the anterior shin additionally.  He denies fevers, chills, shortness of breath, nausea, vomiting, diaphoresis, or night sweats.  He states he has been compliant with his HIV medicines and is scheduled to follow-up with his infectious disease doctor later this month.  His viral load was detectable November 2018 when it was last checked but he states he has only missed 2 doses this month.  He has tried Tylenol, Goody powder, ice without significant relief of his symptoms.  He denies any known trauma but states he does work 3 jobs and is on his feet all day.  No recent travel or surgeries, no hemoptysis, no prior history of DVT or PE.  No recent changes in his medications.  The history is provided by the patient.    Past Medical History:  Diagnosis Date  . Depression   . Headache   . History of fractured vertebra april 8th Diagnosed   Larey SeatFell out of a tree while climbing it.  . Human immunodeficiency virus I infection (HCC) 2016  . Hypertension   . Mental disorder   . Myocardial infarction [facility](HCC)     Patient Active Problem List   Diagnosis Date Noted  . Febrile illness 09/16/2016  . Hx of neurosyphilis 09/16/2016  . Essential hypertension 09/16/2016  . Human immunodeficiency virus I infection (HCC) 07/20/2016  . Hepatitis B immune  07/18/2016  . Non-compliance with treatment 10/06/2011  . Insomnia related to another mental disorder 10/04/2011  . Schizoaffective disorder, bipolar type (HCC) 10/03/2011    Class: Acute    Past Surgical History:  Procedure Laterality Date  . NO PAST SURGERIES          Home Medications    Prior to Admission medications   Medication Sig Start Date End Date Taking? Authorizing Provider  abacavir-dolutegravir-lamiVUDine (TRIUMEQ) 600-50-300 MG tablet Take 1 tablet by mouth daily. 08/03/17   Ginnie SmartHatcher, Jeffrey C, MD  LORazepam (ATIVAN) 1 MG tablet Take 1 tablet (1 mg total) by mouth every 6 (six) hours as needed for anxiety. 03/13/17   Robinson, [place] N, PA-C  ondansetron (ZOFRAN ODT) 4 MG disintegrating tablet Take 1 tablet (4 mg total) by mouth every 6 (six) hours as needed for nausea or vomiting. 10/05/17   Ward, Layla MawKristen N, DO  pantoprazole (PROTONIX) 40 MG tablet Take 1 tablet (40 mg total) by mouth daily. 10/05/17   Ward, Layla MawKristen N, DO    Family History Family History  Problem Relation Age of Onset  . CAD Mother 3556       MI    Social History Social History   Tobacco Use  . Smoking status: Never Smoker  . Smokeless tobacco: Never Used  Substance Use Topics  . Alcohol use: No  . Drug use: No     Allergies   Tomato; Risperdal [risperidone]; and Aspirin   Review  of Systems Review of Systems  Constitutional: Negative for chills, diaphoresis and fever.  Respiratory: Negative for cough and shortness of breath.   Cardiovascular: Negative for chest pain.  Gastrointestinal: Negative for abdominal pain, nausea and vomiting.  Musculoskeletal: Positive for arthralgias and myalgias.  Neurological: Positive for numbness. Negative for syncope, weakness and headaches.  All other systems reviewed and are negative.    Physical Exam Updated Vital Signs BP (!) 142/99 (BP Location: Right Arm)   Pulse 91   Temp 98.1 F (36.7 C) (Oral)   Resp 18   SpO2 100%   Physical Exam    Constitutional: He appears well-developed and well-nourished. No distress.  Resting comfortably in chair, in no apparent distress  HENT:  Head: Normocephalic and atraumatic.  Eyes: Conjunctivae are normal. Right eye exhibits no discharge. Left eye exhibits no discharge.  Neck: No JVD present. No tracheal deviation present.  Cardiovascular: Normal rate and intact distal pulses.  2+ DP/PT pulses bilaterally, no lower extremity edema, Homans sign absent bilaterally, no palpable cords  Pulmonary/Chest: Effort normal.  Abdominal: He exhibits no distension.  Musculoskeletal: Normal range of motion. He exhibits tenderness. He exhibits no edema.       Left ankle: Normal. Achilles tendon normal.  There is tenderness to palpation of the left shin distally with no deformity, crepitus, ecchymosis, erythema, warmth, or induration.  5/5 strength of BLE major muscle groups.  No ligamentous laxity or varus or valgus instability.  Neurological: He is alert.  Fluent speech with no evidence of dysarthria or aphasia, no facial droop, decreased sensation to soft touch of the left shin distally.  Ambulates with an antalgic gait but is able to Heel Walk and Toe Walk with mild assistance.  Skin: Skin is warm and dry. No erythema.  Psychiatric: He has a normal mood and affect. His behavior is normal.  Nursing note and vitals reviewed.    ED Treatments / Results  Labs (all labs ordered are listed, but only abnormal results are displayed) Labs Reviewed - No data to display  EKG None  Radiology Dg Tibia/fibula Left  Result Date: 11/06/2017 CLINICAL DATA:  Ankle pain and swelling for the past 5 days. No injury. EXAM: LEFT ANKLE COMPLETE - 3+ VIEW; LEFT TIBIA AND FIBULA - 2 VIEW COMPARISON:  None. FINDINGS: No acute fracture or dislocation. The ankle mortise is symmetric. The talar dome is intact. No tibiotalar joint effusion. Bone mineralization is normal. Soft tissues are unremarkable. IMPRESSION: Negative.  Electronically Signed   By: Obie Dredge M.D.   On: 11/06/2017 10:07   Dg Ankle Complete Left  Result Date: 11/06/2017 CLINICAL DATA:  Ankle pain and swelling for the past 5 days. No injury. EXAM: LEFT ANKLE COMPLETE - 3+ VIEW; LEFT TIBIA AND FIBULA - 2 VIEW COMPARISON:  None. FINDINGS: No acute fracture or dislocation. The ankle mortise is symmetric. The talar dome is intact. No tibiotalar joint effusion. Bone mineralization is normal. Soft tissues are unremarkable. IMPRESSION: Negative. Electronically Signed   By: Obie Dredge M.D.   On: 11/06/2017 10:07    Procedures Procedures (including critical care time)  Medications Ordered in ED Medications - No data to display   Initial Impression / Assessment and Plan / ED Course  I have reviewed the triage vital signs and the nursing notes.  Pertinent labs & imaging results that were available during my care of the patient were reviewed by me and considered in my medical decision making (see chart for details).  Patient with distal anterior left leg pain for 5 days.  He is afebrile, vital signs are at his baseline.  He is nontoxic in appearance.  He is neurovascularly intact.  He is ambulatory without difficulty despite pain.  No history of trauma but he does spend a significant portion of the day on his feet and works 3 jobs.  Radiographs show no acute osseous abnormalities, no fracture dislocation.  I doubt DVT, osteomyelitis, septic joint, compartment syndrome, or gout.  Symptoms do appear to be musculoskeletal in etiology.  Will discharge with ASO ankle brace, patient declined crutches.  He has follow-up scheduled with his infectious disease doctor Dr. Ninetta Lights in the next 2 weeks.  Considered myositis however patient has been on his medications consistently for at least a year, and I doubt rhabdomyolysis.  Discussed RICE therapy, and follow-up with PCP for reevaluation.  Discussed strict ED return precautions.  Pt verbalized  understanding of and agreement with plan and is safe for discharge home at this time. Discussed case with Dr. Rhunette Croft who agrees with assessment and plan at this time.  Final Clinical Impressions(s) / ED Diagnoses   Final diagnoses:  Left leg pain    ED Discharge Orders    None       Jeanie Sewer, PA-C 11/06/17 1029    Derwood Kaplan, MD 11/09/17 1102

## 2017-11-06 NOTE — ED Notes (Signed)
Ortho tech paged  

## 2017-11-06 NOTE — Discharge Instructions (Signed)
1. Medications: Alternate 600 mg of ibuprofen and [phone] mg of Tylenol every 3 hours as needed for pain. Do not exceed 4000 mg of Tylenol daily.  Take ibuprofen with food to avoid upset stomach issues.   2. Treatment: rest, ice, elevate and use brace, drink plenty of fluids, gentle stretching 3. Follow Up: Please followup with orthopedics as directed or your PCP in 1 week if no improvement for discussion of your diagnoses and further evaluation after today's visit; if you do not have a primary care doctor use the resource guide provided to find one; Please return to the ER for worsening symptoms or other concerns such as fever, worsening swelling, weakness, or loss of pulses.

## 2017-11-06 NOTE — ED Notes (Signed)
Patient transported to X-ray 

## 2017-11-06 NOTE — ED Triage Notes (Signed)
Pt reports left ankle pain X4 days. Pt states he works 3 jobs and thinks he may have injured it. He states using ice without relief.

## 2017-11-06 NOTE — ED Notes (Signed)
Ortho returned page  

## 2017-11-15 ENCOUNTER — Other Ambulatory Visit: Payer: Self-pay

## 2017-11-22 ENCOUNTER — Other Ambulatory Visit: Payer: Medicare Other

## 2017-11-22 ENCOUNTER — Ambulatory Visit: Payer: Self-pay | Admitting: Infectious Diseases

## 2017-11-22 DIAGNOSIS — Z113 Encounter for screening for infections with a predominantly sexual mode of transmission: Secondary | ICD-10-CM

## 2017-11-22 DIAGNOSIS — B20 Human immunodeficiency virus [HIV] disease: Secondary | ICD-10-CM

## 2017-12-13 ENCOUNTER — Encounter: Payer: Self-pay | Admitting: Infectious Diseases

## 2018-03-28 ENCOUNTER — Telehealth: Payer: Self-pay

## 2018-03-28 NOTE — Telephone Encounter (Signed)
Attempted to reach out to patient regarding missed appointments with our office. Patient will need to schedule an appointment with another provider in our office. Dr. Ninetta Lights is reassigning patients to other providers in our office to continue care. Left voicemail asking patient to call office back. Lorenso Courier, [place]

## 2018-04-14 ENCOUNTER — Other Ambulatory Visit: Payer: Self-pay

## 2018-04-14 ENCOUNTER — Emergency Department (HOSPITAL_COMMUNITY)
Admission: EM | Admit: 2018-04-14 | Discharge: 2018-04-14 | Disposition: A | Discharge status: Home / Self Care | Payer: Medicare Other | Attending: Emergency Medicine | Admitting: Emergency Medicine

## 2018-04-14 ENCOUNTER — Encounter (HOSPITAL_COMMUNITY): Payer: Self-pay | Admitting: *Deleted

## 2018-04-14 ENCOUNTER — Emergency Department (HOSPITAL_COMMUNITY): Payer: Medicare Other

## 2018-04-14 DIAGNOSIS — Z77098 Contact with and (suspected) exposure to other hazardous, chiefly nonmedicinal, chemicals: Secondary | ICD-10-CM | POA: Diagnosis not present

## 2018-04-14 DIAGNOSIS — R079 Chest pain, unspecified: Secondary | ICD-10-CM | POA: Diagnosis not present

## 2018-04-14 DIAGNOSIS — H538 Other visual disturbances: Secondary | ICD-10-CM | POA: Insufficient documentation

## 2018-04-14 DIAGNOSIS — Z79899 Other long term (current) drug therapy: Secondary | ICD-10-CM | POA: Insufficient documentation

## 2018-04-14 DIAGNOSIS — R51 Headache: Secondary | ICD-10-CM | POA: Diagnosis not present

## 2018-04-14 DIAGNOSIS — R0602 Shortness of breath: Secondary | ICD-10-CM | POA: Insufficient documentation

## 2018-04-14 DIAGNOSIS — R05 Cough: Secondary | ICD-10-CM | POA: Diagnosis not present

## 2018-04-14 LAB — ACETAMINOPHEN LEVEL

## 2018-04-14 LAB — CBC WITH DIFFERENTIAL/PLATELET
Abs Immature Granulocytes: 0.03 10*3/uL (ref 0.00–0.07)
Basophils Absolute: 0.1 10*3/uL (ref 0.0–0.1)
Basophils Relative: 1 %
EOS ABS: 0.2 10*3/uL (ref 0.0–0.5)
EOS PCT: 1 %
HEMATOCRIT: 41.9 % (ref 39.0–52.0)
Hemoglobin: 13.5 g/dL (ref 13.0–17.0)
IMMATURE GRANULOCYTES: 0 %
Lymphocytes Relative: 16 %
Lymphs Abs: 1.9 10*3/uL (ref 0.7–4.0)
MCH: 27.8 pg (ref 26.0–34.0)
MCHC: 32.2 g/dL (ref 30.0–36.0)
MCV: 86.2 fL (ref 80.0–100.0)
MONO ABS: 0.7 10*3/uL (ref 0.1–1.0)
MONOS PCT: 6 %
NEUTROS PCT: 76 %
Neutro Abs: 8.8 10*3/uL — ABNORMAL HIGH (ref 1.7–7.7)
Platelets: 227 10*3/uL (ref 150–400)
RBC: 4.86 MIL/uL (ref 4.22–5.81)
RDW: 12.3 % (ref 11.5–15.5)
WBC: 11.5 10*3/uL — AB (ref 4.0–10.5)
nRBC: 0 % (ref 0.0–0.2)

## 2018-04-14 LAB — COMPREHENSIVE METABOLIC PANEL
ALT: 28 U/L (ref 0–44)
AST: 25 U/L (ref 15–41)
Albumin: 3.6 g/dL (ref 3.5–5.0)
Alkaline Phosphatase: 61 U/L (ref 38–126)
Anion gap: 6 (ref 5–15)
BILIRUBIN TOTAL: 0.3 mg/dL (ref 0.3–1.2)
BUN: 11 mg/dL (ref 6–20)
CO2: 26 mmol/L (ref 22–32)
CREATININE: 1.07 mg/dL (ref 0.61–1.24)
Calcium: 9.2 mg/dL (ref 8.9–10.3)
Chloride: 104 mmol/L (ref 98–111)
GFR calc Af Amer: >60 mL/min (ref >60)
Glucose, Bld: 94 mg/dL (ref 70–99)
POTASSIUM: 4 mmol/L (ref 3.5–5.1)
Sodium: 136 mmol/L (ref 135–145)
TOTAL PROTEIN: 10.3 g/dL — AB (ref 6.5–8.1)

## 2018-04-14 LAB — RAPID URINE DRUG SCREEN, HOSP PERFORMED
Amphetamines: NOT DETECTED
BARBITURATES: NOT DETECTED
Benzodiazepines: NOT DETECTED
Cocaine: NOT DETECTED
OPIATES: NOT DETECTED
TETRAHYDROCANNABINOL: NOT DETECTED

## 2018-04-14 LAB — AMMONIA: AMMONIA: 36 umol/L — AB (ref 9–35)

## 2018-04-14 LAB — SALICYLATE LEVEL: Salicylate Lvl: <7 mg/dL (ref 2.8–30.0)

## 2018-04-14 LAB — ETHANOL: Alcohol, Ethyl (B): <10 mg/dL (ref <10)

## 2018-04-14 MED ORDER — DIPHENHYDRAMINE HCL 50 MG/ML IJ SOLN
25.0000 mg | Freq: Once | INTRAMUSCULAR | Status: AC
  Administered 2018-04-14: 25 mg via INTRAVENOUS
  Filled 2018-04-14: qty 1

## 2018-04-14 MED ORDER — BACITRACIN-POLYMYXIN B 500-10000 UNIT/GM OP OINT: TOPICAL_OINTMENT | Freq: Four times a day (QID) | OPHTHALMIC | 0 refills | Status: AC

## 2018-04-14 MED ORDER — SODIUM CHLORIDE 0.9 % IV BOLUS: 1000.0000 mL | Freq: Once | INTRAVENOUS | Status: DC

## 2018-04-14 MED ORDER — SODIUM CHLORIDE 0.9 % IV BOLUS
1000.0000 mL | Freq: Once | INTRAVENOUS | Status: AC
  Administered 2018-04-14: 1000 mL via INTRAVENOUS

## 2018-04-14 MED ORDER — ACETAMINOPHEN 325 MG PO TABS
650.0000 mg | ORAL_TABLET | Freq: Once | ORAL | Status: AC
  Administered 2018-04-14: 650 mg via ORAL
  Filled 2018-04-14: qty 2

## 2018-04-14 MED ORDER — FLUORESCEIN SODIUM 1 MG OP STRP
1.0000 | ORAL_STRIP | Freq: Once | OPHTHALMIC | Status: AC
  Administered 2018-04-14: 1 via OPHTHALMIC
  Filled 2018-04-14: qty 1

## 2018-04-14 MED ORDER — KETOROLAC TROMETHAMINE 15 MG/ML IJ SOLN
15.0000 mg | Freq: Once | INTRAMUSCULAR | Status: AC
Start: 2018-04-14 — End: 2018-04-14
  Administered 2018-04-14: 15 mg via INTRAVENOUS
  Filled 2018-04-14: qty 1

## 2018-04-14 MED ORDER — DIPHENHYDRAMINE HCL 50 MG/ML IJ SOLN: 25.0000 mg | Freq: Once | INTRAMUSCULAR | Status: DC

## 2018-04-14 MED ORDER — PROCHLORPERAZINE EDISYLATE 10 MG/2ML IJ SOLN
10.0000 mg | Freq: Once | INTRAMUSCULAR | Status: AC
  Administered 2018-04-14: 10 mg via INTRAVENOUS
  Filled 2018-04-14: qty 2

## 2018-04-14 NOTE — ED Notes (Signed)
Declined W/C at D/C and was escorted to lobby by RN. 

## 2018-04-14 NOTE — ED Notes (Signed)
Is only able to read the top line of visual acuity chart, 20/160 (both, left and right-all same)

## 2018-04-14 NOTE — ED Triage Notes (Signed)
Pt reported to MD in room he di dnot want to irrigate his eyes for a second time.

## 2018-04-14 NOTE — ED Triage Notes (Signed)
PT mixed bleach and ammonia together to clean with . Pt went to sleep and woke with burning eyes and cough.

## 2018-04-14 NOTE — ED Triage Notes (Signed)
Pt  Continues to refuse to have second irrigation of eyes

## 2018-04-14 NOTE — ED Provider Notes (Signed)
[facility] EMERGENCY DEPARTMENT Provider Note   CSN: [id number] Arrival date & time: 04/14/18  1320     History   Chief Complaint Chief Complaint  Patient presents with  . Chemical Exposure    HPI Hector [name] is a 32 y.o. male.  32 year old male with past medical history of HIV, HTN, CAD presented to ED due to chemical exposure to Clorox and ammonia.  He was cleaning his home this a.m., mopping the floor with Clorox and ammonia. He then fell asleep and woke up with severe headache, cough and shortness of breath, blurry vision and watery eyes.  He says he cannot open his eyes due to pain.  Has some shortness of breath and chest pain when he cough but no severe difficulty breathing     Past Medical History:  Diagnosis Date  . Depression   . Headache   . History of fractured vertebra april 8th Diagnosed   Larey Seat out of a tree while climbing it.  . Human immunodeficiency virus I infection (HCC) 2016  . Hypertension   . Mental disorder   . Myocardial infarction [facility])     Patient Active Problem List   Diagnosis Date Noted  . Febrile illness 09/16/2016  . Hx of neurosyphilis 09/16/2016  . Essential hypertension 09/16/2016  . Human immunodeficiency virus I infection (HCC) 07/20/2016  . Hepatitis B immune 07/18/2016  . Non-compliance with treatment 10/06/2011  . Insomnia related to another mental disorder 10/04/2011  . Schizoaffective disorder, bipolar type (HCC) 10/03/2011    Class: Acute    Past Surgical History:  Procedure Laterality Date  . NO PAST SURGERIES          Home Medications    Prior to Admission medications   Medication Sig Start Date End Date Taking? Authorizing Provider  abacavir-dolutegravir-lamiVUDine (TRIUMEQ) 600-50-300 MG tablet Take 1 tablet by mouth daily. 08/03/17   Ginnie Smart, MD  LORazepam (ATIVAN) 1 MG tablet Take 1 tablet (1 mg total) by mouth every 6 (six) hours as needed for anxiety. 03/13/17   Robinson,  [place] N, PA-C  ondansetron (ZOFRAN ODT) 4 MG disintegrating tablet Take 1 tablet (4 mg total) by mouth every 6 (six) hours as needed for nausea or vomiting. 10/05/17   Ward, Layla Maw, DO  pantoprazole (PROTONIX) 40 MG tablet Take 1 tablet (40 mg total) by mouth daily. 10/05/17   Ward, Layla Maw, DO    Family History Family History  Problem Relation Age of Onset  . CAD Mother 66       MI    Social History Social History   Tobacco Use  . Smoking status: Never Smoker  . Smokeless tobacco: Never Used  Substance Use Topics  . Alcohol use: No  . Drug use: No     Allergies   Tomato; Risperdal [risperidone]; and Aspirin   Review of Systems Review of Systems   Physical Exam Updated Vital Signs SpO2 98%   Physical Exam  Constitutional: He is oriented to person, place, and time. He appears well-developed and well-nourished.  Eyes: injected conjunctiva. With excessive tearing. Can hardly open eyes due to pain.  Pulmonary/Chest: Effort normal and breath sounds normal. No respiratory distress. He has no wheezes. He has no rhonchi. He has no rales.  Abdominal: Soft. Bowel sounds are normal. There is no tenderness.  Neurological: He is alert and oriented to person, place, and time.  Vitals reviewed.  ED Treatments / Results  Labs (all labs ordered are listed, but  only abnormal results are displayed) Labs Reviewed - No data to display  EKG None  Radiology No results found.  Procedures Procedures (including critical care time)  Medications Ordered in ED Medications - No data to display   Initial Impression / Assessment and Plan / ED Course  I have reviewed the triage vital signs and the nursing notes.  Pertinent labs & imaging results that were available during my care of the patient were reviewed by me and considered in my medical decision making (see chart for details). Vital sign stable. Poison control contacted.  -Eye irrigation for 20 minute. Then rest the eyes  for 10 minutes and then check eye PH. Keep the PH between 7 to 8. Repeat eye irrigation to achive PH at 7-8. If no improvement in vision or PH, more eye exam.   -CXR for chemical inhalation pneumonia and pulmonary edema -Supportive respiratory care andAlbuterol if sign of bronchospasms -Consider systemic steroid if in respiratory distress  Update:  Post irrigation: Patient can open his eyes but still complains of blurred. PH around 8. Will repeat irrigation.  Also complains of dizziness. Will give IV fluid and pain control. CXR unremarkable. Has slight leukocytosis which is likely reactive.    Update After second irrigation, vision improved. No more blurred vision.  Headache and SOB resolved. Eye PH rechecked<8.  Patient is discharged with ophthalmic Bacitracin-Polymixin ointment for 7 days and recommended to follow up with ophthalmologist in next 24-48 h.  Final Clinical Impressions(s) / ED Diagnoses   Final diagnoses:  None    ED Discharge Orders    None       Chevis PrettyMasoudi, Andreana Klingerman, MD 04/14/18 Prentice [id number]    Alvira MondaySchlossman, Erin, MD 04/22/18 1334

## 2018-04-14 NOTE — Discharge Instructions (Addendum)
Please use the antibiotic (ointment) as instructed for your eye. Also, please call on call ophthalmologist at [phone] to be seen in next 24-48 hours. Please mention that you have been in emergency room.  If developed any more shortness of breath or visual changes, you can come back to emergency room or urgent care. Thanks

## 2018-06-04 ENCOUNTER — Telehealth: Payer: Self-pay

## 2018-06-04 ENCOUNTER — Other Ambulatory Visit: Payer: Medicare Other

## 2018-06-04 ENCOUNTER — Encounter (HOSPITAL_COMMUNITY): Payer: Self-pay | Admitting: Emergency Medicine

## 2018-06-04 ENCOUNTER — Emergency Department (HOSPITAL_COMMUNITY): Payer: Medicare Other

## 2018-06-04 ENCOUNTER — Other Ambulatory Visit: Payer: Self-pay

## 2018-06-04 ENCOUNTER — Emergency Department (HOSPITAL_COMMUNITY)
Admission: EM | Admit: 2018-06-04 | Discharge: 2018-06-04 | Disposition: A | Discharge status: Home / Self Care | Payer: Medicare Other | Attending: Emergency Medicine | Admitting: Emergency Medicine

## 2018-06-04 ENCOUNTER — Ambulatory Visit: Payer: Medicare Other | Admitting: Licensed Clinical Social Worker

## 2018-06-04 DIAGNOSIS — B20 Human immunodeficiency virus [HIV] disease: Secondary | ICD-10-CM

## 2018-06-04 DIAGNOSIS — R51 Headache: Secondary | ICD-10-CM | POA: Diagnosis not present

## 2018-06-04 DIAGNOSIS — R69 Illness, unspecified: Secondary | ICD-10-CM

## 2018-06-04 DIAGNOSIS — I10 Essential (primary) hypertension: Secondary | ICD-10-CM | POA: Insufficient documentation

## 2018-06-04 DIAGNOSIS — R110 Nausea: Secondary | ICD-10-CM | POA: Diagnosis not present

## 2018-06-04 DIAGNOSIS — Z113 Encounter for screening for infections with a predominantly sexual mode of transmission: Secondary | ICD-10-CM

## 2018-06-04 DIAGNOSIS — R509 Fever, unspecified: Secondary | ICD-10-CM | POA: Diagnosis not present

## 2018-06-04 DIAGNOSIS — I252 Old myocardial infarction: Secondary | ICD-10-CM | POA: Diagnosis not present

## 2018-06-04 DIAGNOSIS — Z79899 Other long term (current) drug therapy: Secondary | ICD-10-CM

## 2018-06-04 DIAGNOSIS — R0602 Shortness of breath: Secondary | ICD-10-CM | POA: Insufficient documentation

## 2018-06-04 DIAGNOSIS — J111 Influenza due to unidentified influenza virus with other respiratory manifestations: Secondary | ICD-10-CM | POA: Diagnosis not present

## 2018-06-04 DIAGNOSIS — M7918 Myalgia, other site: Secondary | ICD-10-CM | POA: Diagnosis not present

## 2018-06-04 DIAGNOSIS — R52 Pain, unspecified: Secondary | ICD-10-CM | POA: Diagnosis not present

## 2018-06-04 DIAGNOSIS — R05 Cough: Secondary | ICD-10-CM | POA: Insufficient documentation

## 2018-06-04 MED ORDER — FLUTICASONE PROPIONATE 50 MCG/ACT NA SUSP: 1.0000 | Freq: Every day | NASAL | 2 refills | Status: DC

## 2018-06-04 MED ORDER — ACETAMINOPHEN 325 MG PO TABS
650.0000 mg | ORAL_TABLET | Freq: Once | ORAL | Status: AC
  Administered 2018-06-04: 650 mg via ORAL
  Filled 2018-06-04: qty 2

## 2018-06-04 MED ORDER — BENZONATATE 100 MG PO CAPS: 200.0000 mg | ORAL_CAPSULE | Freq: Three times a day (TID) | ORAL | 0 refills | Status: DC

## 2018-06-04 MED ORDER — ACETAMINOPHEN 325 MG PO TABS: 650.0000 mg | ORAL_TABLET | Freq: Four times a day (QID) | ORAL | 0 refills | Status: DC | PRN

## 2018-06-04 MED ORDER — ONDANSETRON 4 MG PO TBDP: 4.0000 mg | ORAL_TABLET | Freq: Three times a day (TID) | ORAL | 0 refills | Status: DC | PRN

## 2018-06-04 MED ORDER — OXYCODONE-ACETAMINOPHEN 5-325 MG PO TABS
1.0000 | ORAL_TABLET | Freq: Once | ORAL | Status: AC
  Administered 2018-06-04: 1 via ORAL
  Filled 2018-06-04: qty 1

## 2018-06-04 NOTE — Discharge Instructions (Signed)
Return to ED for worsening symptoms, increased chest pain, shortness of breath, coughing or vomiting up blood.

## 2018-06-04 NOTE — ED Triage Notes (Signed)
Flu s/s x 3 days , cough , cold fever, has had n/v/d also last took tylenol at 4 am

## 2018-06-04 NOTE — Telephone Encounter (Signed)
Hector [name] counselor is calling. Patient was scheduled to come to [facility] today for lab visit. He is currently in emergency room being treated for flu.  She wonders if ED can draw labs.  I attempted to reach ED via without success. Patient will need to reschedule his visit for labs.  [name] informed .   [phone]  [name], RN

## 2018-06-04 NOTE — ED Provider Notes (Signed)
[facility] Provider Note   CSN: [id number] Arrival date & time: 06/04/18  0909     History   Chief Complaint Chief Complaint  Patient presents with  . Influenza    HPI Hector [name] is a 33 y.o. male with a past medical history of HIV not currently on ART, hypertension, prior MI who presents to ED for evaluation of 3-day history of cough productive with yellow mucus, generalized body aches, bilateral ear pain, sinus pain and pressure, shortness of breath, nausea, vomiting and diarrhea.  No sick contacts with similar symptoms.  He did receive his influenza vaccine this year.  He has been taking Tylenol with no improvement in his symptoms.  He is concerned that he may have pneumonia.  He denies any chest pain, recent travel, hemoptysis, discharge from the ear.  HPI  Past Medical History:  Diagnosis Date  . Depression   . Headache   . History of fractured vertebra april 8th Diagnosed   Larey SeatFell out of a tree while climbing it.  . Human immunodeficiency virus I infection (HCC) 2016  . Hypertension   . Mental disorder   . Myocardial infarction [facility](HCC)     Patient Active Problem List   Diagnosis Date Noted  . Febrile illness 09/16/2016  . Hx of neurosyphilis 09/16/2016  . Essential hypertension 09/16/2016  . Human immunodeficiency virus I infection (HCC) 07/20/2016  . Hepatitis B immune 07/18/2016  . Non-compliance with treatment 10/06/2011  . Insomnia related to another mental disorder 10/04/2011  . Schizoaffective disorder, bipolar type (HCC) 10/03/2011    Class: Acute    Past Surgical History:  Procedure Laterality Date  . NO PAST SURGERIES          Home Medications    Prior to Admission medications   Medication Sig Start Date End Date Taking? Authorizing Provider  abacavir-dolutegravir-lamiVUDine (TRIUMEQ) 600-50-300 MG tablet Take 1 tablet by mouth daily. 08/03/17   Ginnie SmartHatcher, Jeffrey C, MD  acetaminophen (TYLENOL) 325 MG tablet  Take 2 tablets (650 mg total) by mouth every 6 (six) hours as needed. 06/04/18   Shamaria Kavan, PA-C  benzonatate (TESSALON) 100 MG capsule Take 2 capsules (200 mg total) by mouth every 8 (eight) hours. 06/04/18   Jerris Fleer, PA-C  fluticasone (FLONASE) 50 MCG/ACT nasal spray Place 1 spray into both nostrils daily. 06/04/18   Luceal Hollibaugh, PA-C  LORazepam (ATIVAN) 1 MG tablet Take 1 tablet (1 mg total) by mouth every 6 (six) hours as needed for anxiety. 03/13/17   Robinson, [place] N, PA-C  ondansetron (ZOFRAN ODT) 4 MG disintegrating tablet Take 1 tablet (4 mg total) by mouth every 8 (eight) hours as needed for nausea or vomiting. 06/04/18   Gerardo Caiazzo, PA-C  pantoprazole (PROTONIX) 40 MG tablet Take 1 tablet (40 mg total) by mouth daily. 10/05/17   Ward, Layla MawKristen N, DO    Family History Family History  Problem Relation Age of Onset  . CAD Mother 5756       MI    Social History Social History   Tobacco Use  . Smoking status: Never Smoker  . Smokeless tobacco: Never Used  Substance Use Topics  . Alcohol use: No  . Drug use: No     Allergies   Tomato; Risperdal [risperidone]; and Aspirin   Review of Systems Review of Systems  Constitutional: Positive for chills and fever. Negative for appetite change.  HENT: Positive for congestion, sinus pressure and sinus pain. Negative for ear pain, rhinorrhea,  sneezing and sore throat.   Eyes: Negative for photophobia and visual disturbance.  Respiratory: Positive for cough and shortness of breath. Negative for chest tightness and wheezing.   Cardiovascular: Negative for chest pain and palpitations.  Gastrointestinal: Negative for abdominal pain, blood in stool, constipation, diarrhea, nausea and vomiting.  Genitourinary: Negative for dysuria, hematuria and urgency.  Musculoskeletal: Positive for myalgias.  Skin: Negative for rash.  Neurological: Negative for dizziness, weakness and light-headedness.     Physical Exam Updated Vital  Signs BP (!) 146/78 (BP Location: Left Arm)   Pulse (!) 106   Temp 100.2 F (37.9 C) (Oral)   Resp 18   SpO2 98%   Physical Exam Vitals signs and nursing note reviewed.  Constitutional:      General: He is not in acute distress.    Appearance: He is well-developed.  HENT:     Head: Normocephalic and atraumatic.     Right Ear: A middle ear effusion is present.     Left Ear: A middle ear effusion is present.     Nose: Rhinorrhea present.     Right Sinus: Maxillary sinus tenderness present.     Left Sinus: Maxillary sinus tenderness present.     Mouth/Throat:     Pharynx: Oropharynx is clear. Uvula midline.     Tonsils: No tonsillar abscesses. Swelling: 0 on the right. 0 on the left.  Eyes:     General: No scleral icterus.       Right eye: No discharge.        Left eye: No discharge.     Conjunctiva/sclera: Conjunctivae normal.  Neck:     Musculoskeletal: Normal range of motion and neck supple.  Cardiovascular:     Rate and Rhythm: Normal rate and regular rhythm.     Heart sounds: Normal heart sounds. No murmur. No friction rub. No gallop.   Pulmonary:     Effort: Pulmonary effort is normal. No respiratory distress.     Breath sounds: Normal breath sounds.  Abdominal:     General: Bowel sounds are normal. There is no distension.     Palpations: Abdomen is soft.     Tenderness: There is no abdominal tenderness. There is no guarding.  Musculoskeletal: Normal range of motion.  Skin:    General: Skin is warm and dry.     Findings: No rash.  Neurological:     Mental Status: He is alert.     Motor: No abnormal muscle tone.     Coordination: Coordination normal.      ED Treatments / Results  Labs (all labs ordered are listed, but only abnormal results are displayed) Labs Reviewed - No data to display  EKG EKG Interpretation  Date/Time:  Monday June 04 2018 09:42:25 EST Ventricular Rate:  93 PR Interval:  148 QRS Duration: 88 QT Interval:  326 QTC  Calculation: 405 R Axis:   78 Text Interpretation:  Normal sinus rhythm Possible Left atrial enlargement Borderline ECG No significant change since last tracing Confirmed by Linwood Dibbles [phone]) on 06/04/2018 9:59:11 AM   Radiology Dg Chest 2 View  Result Date: 06/04/2018 CLINICAL DATA:  33 year old male with history of cough, cold and fever for the past 3 days. EXAM: CHEST - 2 VIEW COMPARISON:  Chest x-ray 04/14/2018. FINDINGS: Lung volumes are normal. No consolidative airspace disease. No pleural effusions. No pneumothorax. No pulmonary nodule or mass noted. Pulmonary vasculature and the cardiomediastinal silhouette are within normal limits. IMPRESSION: No radiographic evidence of acute  cardiopulmonary disease. Electronically Signed   By: Trudie Reed M.D.   On: 06/04/2018 12:32    Procedures Procedures (including critical care time)  Medications Ordered in ED Medications  acetaminophen (TYLENOL) tablet 650 mg (650 mg Oral Given 06/04/18 0947)  oxyCODONE-acetaminophen (PERCOCET/ROXICET) 5-325 MG per tablet 1 tablet (1 tablet Oral Given 06/04/18 1246)     Initial Impression / Assessment and Plan / ED Course  I have reviewed the triage vital signs and the nursing notes.  Pertinent labs & imaging results that were available during my care of the patient were reviewed by me and considered in my medical decision making (see chart for details).     Patient with symptoms consistent with influenza.  Vitals are stable, low-grade fever.  No signs of dehydration, tolerating PO's.  Lungs are clear.  Chest x-ray is unremarkable.  Fever improved here.  Discussed the cost versus benefit of Tamiflu treatment with the patient.  The patient understands that symptoms are greater than the recommended 24-48 hour window of treatment.  Patient will be discharged with instructions to orally hydrate, rest, and use over-the-counter medications such as Tylenol and will treat symptomatically.  Patient is  hemodynamically stable, in NAD, and able to ambulate in the ED. Evaluation does not show pathology that would require ongoing emergent intervention or inpatient treatment. I explained the diagnosis to the patient. Pain has been managed and has no complaints prior to discharge. Patient is comfortable with above plan and is stable for discharge at this time. All questions were answered prior to disposition. Strict return precautions for returning to the ED were discussed. Encouraged follow up with PCP.    Portions of this note were generated with Scientist, clinical (histocompatibility and immunogenetics). Dictation errors may occur despite best attempts at proofreading.   Final Clinical Impressions(s) / ED Diagnoses   Final diagnoses:  Influenza-like illness    ED Discharge Orders         Ordered    benzonatate (TESSALON) 100 MG capsule  Every 8 hours     06/04/18 1246    acetaminophen (TYLENOL) 325 MG tablet  Every 6 hours PRN     06/04/18 1246    ondansetron (ZOFRAN ODT) 4 MG disintegrating tablet  Every 8 hours PRN     06/04/18 1246    fluticasone (FLONASE) 50 MCG/ACT nasal spray  Daily     06/04/18 1246           Dietrich Pates, PA-C 06/04/18 1248    Linwood Dibbles, MD 06/06/18 [phone]

## 2018-06-05 ENCOUNTER — Encounter (HOSPITAL_COMMUNITY): Payer: Self-pay | Admitting: Emergency Medicine

## 2018-06-05 ENCOUNTER — Ambulatory Visit (HOSPITAL_COMMUNITY)
Admission: EM | Admit: 2018-06-05 | Discharge: 2018-06-05 | Disposition: A | Discharge status: Home / Self Care | Payer: Medicare Other | Attending: Family Medicine | Admitting: Family Medicine

## 2018-06-05 DIAGNOSIS — R69 Illness, unspecified: Secondary | ICD-10-CM

## 2018-06-05 DIAGNOSIS — J111 Influenza due to unidentified influenza virus with other respiratory manifestations: Secondary | ICD-10-CM

## 2018-06-05 MED ORDER — ONDANSETRON 4 MG PO TBDP: 4.0000 mg | ORAL_TABLET | Freq: Three times a day (TID) | ORAL | 0 refills | Status: DC | PRN

## 2018-06-05 MED ORDER — OSELTAMIVIR PHOSPHATE 75 MG PO CAPS: 75.0000 mg | ORAL_CAPSULE | Freq: Two times a day (BID) | ORAL | 0 refills | Status: AC

## 2018-06-05 MED ORDER — HYDROCODONE-HOMATROPINE 5-1.5 MG/5ML PO SYRP: 5.0000 mL | ORAL_SOLUTION | Freq: Four times a day (QID) | ORAL | 0 refills | Status: DC | PRN

## 2018-06-05 MED FILL — ONDANSETRON ODT 4 MG TABLET: 4 | 5 days supply | Qty: 15 | Fill #0

## 2018-06-05 MED FILL — OSELTAMIVIR PHOSPHATE 75 MG: 75 | 5 days supply | Qty: 10 | Fill #0

## 2018-06-05 NOTE — ED Triage Notes (Signed)
Pt here for flu like sx; pt seen in ED for same yesterday

## 2018-06-05 NOTE — ED Provider Notes (Signed)
[facility]ARE [facility]   [id number] 06/05/18 Arrival Time: 1145  ASSESSMENT & PLAN:  1. Influenza-like illness    No suspicion for pneumonia. No indication for chest imaging at this time. Discussed. He would like to take Tamiflu even with symptoms >48 hours. See AVS for d/c instructions.  Meds ordered this encounter  Medications  . HYDROcodone-homatropine (HYCODAN) 5-1.5 MG/5ML syrup    Sig: Take 5 mLs by mouth every 6 (six) hours as needed for cough.    Dispense:  90 mL    Refill:  0  . oseltamivir (TAMIFLU) 75 MG capsule    Sig: Take 1 capsule (75 mg total) by mouth 2 (two) times daily for 5 days.    Dispense:  10 capsule    Refill:  0  . ondansetron (ZOFRAN-ODT) 4 MG disintegrating tablet    Sig: Take 1 tablet (4 mg total) by mouth every 8 (eight) hours as needed for nausea or vomiting.    Dispense:  15 tablet    Refill:  0   Cough medication sedation precautions. Discussed typical duration of symptoms. OTC symptom care as needed. Ensure adequate fluid intake and rest. May f/u with PCP or here as needed.  Reviewed expectations re: course of current medical issues. Questions answered. Outlined signs and symptoms indicating need for more acute intervention. Patient verbalized understanding. After Visit Summary given.   SUBJECTIVE: History from: patient.  Hector [name] is a 33 y.o. male with HIV who presents with complaint of nasal congestion, post-nasal drainage, and a persistent dry cough; without sore throat. Onset abrupt, about 4 days ago. Overall with fatigue and with body aches. SOB: none. Wheezing: none. Fever: yes, subjective and mainly at night. Cough is affecting sleep. Overall normal PO intake without n/v. Known sick contacts: no. No specific or significant aggravating or alleviating factors reported. OTC treatment: Tylenol without much help. Seen in ED yesterday for the same.  Social History   Tobacco Use  Smoking Status Never Smoker  Smokeless Tobacco  Never Used    ROS: As per HPI. All other systems negative   OBJECTIVE:  Vitals:   06/05/18 1241  BP: (!) 155/87  Pulse: 91  Resp: 18  Temp: 97.7 F (36.5 C)  TempSrc: Oral  SpO2: 98%    General appearance: alert; appears fatigued HEENT: nasal congestion; clear runny nose; throat irritation secondary to post-nasal drainage Neck: supple without LAD CV: RRR Lungs: unlabored respirations, symmetrical air entry without wheezing; cough: moderate Abd: soft Ext: no LE edema Skin: warm and dry Psychological: alert and cooperative; normal mood and affect   Allergies  Allergen Reactions  . Tomato Anaphylaxis and Rash  . Risperdal [Risperidone] Nausea And Vomiting  . Aspirin Itching    Past Medical History:  Diagnosis Date  . Depression   . Headache   . History of fractured vertebra april 8th Diagnosed   Larey Seat out of a tree while climbing it.  . Human immunodeficiency virus I infection (HCC) 2016  . Hypertension   . Mental disorder   . Myocardial infarction [facility])    Family History  Problem Relation Age of Onset  . CAD Mother 34       MI   Social History   Socioeconomic History  . Marital status: Single    Spouse name: Not on file  . Number of children: Not on file  . Years of education: Not on file  . Highest education level: Not on file  Occupational History  . Not on file  Social Needs  . Financial resource strain: Not on file  . Food insecurity:    Worry: Not on file    Inability: Not on file  . Transportation needs:    Medical: Not on file    Non-medical: Not on file  Tobacco Use  . Smoking status: Never Smoker  . Smokeless tobacco: Never Used  Substance and Sexual Activity  . Alcohol use: No  . Drug use: No  . Sexual activity: Never    Comment: declined   Lifestyle  . Physical activity:    Days per week: Not on file    Minutes per session: Not on file  . Stress: Not on file  Relationships  . Social connections:    Talks on phone: Not on  file    Gets together: Not on file    Attends religious service: Not on file    Active member of club or organization: Not on file    Attends meetings of clubs or organizations: Not on file    Relationship status: Not on file  . Intimate partner violence:    Fear of current or ex partner: Not on file    Emotionally abused: Not on file    Physically abused: Not on file    Forced sexual activity: Not on file  Other Topics Concern  . Not on file  Social History Narrative  . Not on file           Mardella LaymanHagler, Stephanie Littman, MD 06/05/18 [phone]

## 2018-06-05 NOTE — Discharge Instructions (Signed)

## 2018-06-07 ENCOUNTER — Encounter (HOSPITAL_COMMUNITY): Payer: Self-pay | Admitting: Emergency Medicine

## 2018-06-07 ENCOUNTER — Other Ambulatory Visit: Payer: Self-pay

## 2018-06-07 ENCOUNTER — Emergency Department (HOSPITAL_COMMUNITY)
Admission: EM | Admit: 2018-06-07 | Discharge: 2018-06-07 | Disposition: A | Discharge status: Home / Self Care | Payer: Medicare Other | Attending: Emergency Medicine | Admitting: Emergency Medicine

## 2018-06-07 ENCOUNTER — Emergency Department (HOSPITAL_COMMUNITY): Payer: Medicare Other

## 2018-06-07 DIAGNOSIS — I10 Essential (primary) hypertension: Secondary | ICD-10-CM | POA: Diagnosis not present

## 2018-06-07 DIAGNOSIS — Z79899 Other long term (current) drug therapy: Secondary | ICD-10-CM | POA: Diagnosis not present

## 2018-06-07 DIAGNOSIS — Z21 Asymptomatic human immunodeficiency virus [HIV] infection status: Secondary | ICD-10-CM | POA: Diagnosis not present

## 2018-06-07 DIAGNOSIS — R05 Cough: Secondary | ICD-10-CM | POA: Diagnosis not present

## 2018-06-07 DIAGNOSIS — J069 Acute upper respiratory infection, unspecified: Secondary | ICD-10-CM

## 2018-06-07 DIAGNOSIS — B9789 Other viral agents as the cause of diseases classified elsewhere: Secondary | ICD-10-CM | POA: Diagnosis not present

## 2018-06-07 MED ORDER — ALBUTEROL SULFATE HFA 108 (90 BASE) MCG/ACT IN AERS
2.0000 | INHALATION_SPRAY | RESPIRATORY_TRACT | Status: DC | PRN
  Administered 2018-06-07: 2 via RESPIRATORY_TRACT
  Filled 2018-06-07: qty 6.7

## 2018-06-07 MED ORDER — GUAIFENESIN 100 MG/5ML PO SOLN
15.0000 mL | Freq: Once | ORAL | Status: AC
  Administered 2018-06-07: 300 mg via ORAL
  Filled 2018-06-07: qty 15

## 2018-06-07 MED ORDER — AEROCHAMBER PLUS FLO-VU LARGE MISC
Status: AC
  Administered 2018-06-07: 1
  Filled 2018-06-07: qty 1

## 2018-06-07 MED ORDER — AEROCHAMBER PLUS FLO-VU MEDIUM MISC
1.0000 | Freq: Once | Status: AC
  Administered 2018-06-07: 1
  Filled 2018-06-07: qty 1

## 2018-06-07 MED ORDER — PROMETHAZINE-DM 6.25-15 MG/5ML PO SYRP: 5.0000 mL | ORAL_SOLUTION | Freq: Four times a day (QID) | ORAL | 0 refills | Status: DC | PRN

## 2018-06-07 NOTE — ED Provider Notes (Signed)
[facility] Provider [facility]   CSN: [id number] Arrival date & time: 06/07/18  1014     History   Chief Complaint Chief Complaint  Patient presents with  . Cough    HPI Ramon DredgeShakei McRae is a 33 y.o. male.  33 year old male with past medical history including HIV not currently on ART, schizoaffective disorder, hypertension who presents with cough.  Patient reports 5 days of persistent cough that was initially associated with fevers and congestion.  He was evaluated on 1/13 and started Tamiflu.  He was reevaluated on 1/14 and given cough medication but the cough medication was too expensive for him so he could not get it.  He has continued to have cough that is worse at night and making it difficult to sleep.  He reports some feelings of wheezing associated with the cough and he coughs so hard that it makes his chest hurt.  He no longer has fevers and denies any sore throat or congestion currently.  He presents today frustrated because he states that no one did anything for him when he was evaluated previously.  He denies any smoking or history of asthma.  No sick contacts. No LE edema.  The history is provided by the patient.  Cough    Past Medical History:  Diagnosis Date  . Depression   . Headache   . History of fractured vertebra april 8th Diagnosed   Larey SeatFell out of a tree while climbing it.  . Human immunodeficiency virus I infection (HCC) 2016  . Hypertension   . Mental disorder   . Myocardial infarction [facility](HCC)     Patient Active Problem List   Diagnosis Date Noted  . Febrile illness 09/16/2016  . Hx of neurosyphilis 09/16/2016  . Essential hypertension 09/16/2016  . Human immunodeficiency virus I infection (HCC) 07/20/2016  . Hepatitis B immune 07/18/2016  . Non-compliance with treatment 10/06/2011  . Insomnia related to another mental disorder 10/04/2011  . Schizoaffective disorder, bipolar type (HCC) 10/03/2011    Class: Acute     Past Surgical History:  Procedure Laterality Date  . NO PAST SURGERIES          Home Medications    Prior to Admission medications   Medication Sig Start Date End Date Taking? Authorizing Provider  abacavir-dolutegravir-lamiVUDine (TRIUMEQ) 600-50-300 MG tablet Take 1 tablet by mouth daily. 08/03/17   Ginnie SmartHatcher, Jeffrey C, MD  acetaminophen (TYLENOL) 325 MG tablet Take 2 tablets (650 mg total) by mouth every 6 (six) hours as needed. 06/04/18   Khatri, Hina, PA-C  fluticasone (FLONASE) 50 MCG/ACT nasal spray Place 1 spray into both nostrils daily. 06/04/18   Khatri, Hina, PA-C  LORazepam (ATIVAN) 1 MG tablet Take 1 tablet (1 mg total) by mouth every 6 (six) hours as needed for anxiety. 03/13/17   Robinson, [place] N, PA-C  ondansetron (ZOFRAN-ODT) 4 MG disintegrating tablet Take 1 tablet (4 mg total) by mouth every 8 (eight) hours as needed for nausea or vomiting. 06/05/18   Mardella LaymanHagler, Brian, MD  oseltamivir (TAMIFLU) 75 MG capsule Take 1 capsule (75 mg total) by mouth 2 (two) times daily for 5 days. 06/05/18 06/10/18  Mardella LaymanHagler, Brian, MD  pantoprazole (PROTONIX) 40 MG tablet Take 1 tablet (40 mg total) by mouth daily. 10/05/17   Ward, Layla MawKristen N, DO  promethazine-dextromethorphan (PROMETHAZINE-DM) 6.25-15 MG/5ML syrup Take 5 mLs by mouth 4 (four) times daily as needed for cough. 06/07/18   , Ambrose Finlandachel Morgan, MD    Family History Family  History  Problem Relation Age of Onset  . CAD Mother 55       MI    Social History Social History   Tobacco Use  . Smoking status: Never Smoker  . Smokeless tobacco: Never Used  Substance Use Topics  . Alcohol use: No  . Drug use: No     Allergies   Tomato; Risperdal [risperidone]; and Aspirin   Review of Systems Review of Systems  Respiratory: Positive for cough.    All other systems reviewed and are negative except that which was mentioned in HPI   Physical Exam Updated Vital Signs BP 134/86 (BP Location: Right Arm)   Pulse 72   Temp  98.6 F (37 C) (Oral)   Resp 16   SpO2 100%   Physical Exam Vitals signs and nursing note reviewed.  Constitutional:      General: He is not in acute distress.    Appearance: He is well-developed.  HENT:     Head: Normocephalic and atraumatic.     Mouth/Throat:     Mouth: Mucous membranes are moist.     Pharynx: Oropharynx is clear. No oropharyngeal exudate or posterior oropharyngeal erythema.  Eyes:     Conjunctiva/sclera: Conjunctivae normal.  Neck:     Musculoskeletal: Neck supple.  Cardiovascular:     Rate and Rhythm: Normal rate and regular rhythm.     Heart sounds: Normal heart sounds. No murmur.  Pulmonary:     Effort: Pulmonary effort is normal.     Breath sounds: Normal breath sounds.     Comments: Mildly diminished in bases, shallow respirations due to coughing Abdominal:     General: Bowel sounds are normal. There is no distension.     Palpations: Abdomen is soft.     Tenderness: There is no abdominal tenderness.  Musculoskeletal:     Right lower leg: No edema.     Left lower leg: No edema.  Skin:    General: Skin is warm and dry.  Neurological:     Mental Status: He is alert and oriented to person, place, and time.     Comments: Fluent speech  Psychiatric:     Comments: Bizarre affect      ED Treatments / Results  Labs (all labs ordered are listed, but only abnormal results are displayed) Labs Reviewed - No data to display  EKG EKG Interpretation  Date/Time:  Thursday June 07 2018 10:21:18 EST Ventricular Rate:  82 PR Interval:  142 QRS Duration: 86 QT Interval:  358 QTC Calculation: 418 R Axis:   83 Text Interpretation:  Normal sinus rhythm Normal ECG similar to previous Confirmed by Frederick Peers [phone]) on 06/07/2018 11:10:47 AM   Radiology Dg Chest 2 View  Result Date: 06/07/2018 CLINICAL DATA:  Persistent cough and wheezing. EXAM: CHEST - 2 VIEW COMPARISON:  06/04/2018 FINDINGS: Heart size is normal. Mediastinal shadows are normal.  There is mild central bronchial thickening but no infiltrate, collapse or effusion. No air trapping. Mild chronic spinal curvature. IMPRESSION: Bronchitis pattern. No consolidation or collapse. Electronically Signed   By: Paulina Fusi M.D.   On: 06/07/2018 12:50    Procedures Procedures (including critical care time)  Medications Ordered in ED Medications  albuterol (PROVENTIL HFA;VENTOLIN HFA) 108 (90 Base) MCG/ACT inhaler 2 puff (2 puffs Inhalation Provided for home use 06/07/18 1300)  AEROCHAMBER PLUS FLO-VU MEDIUM MISC 1 each (1 each Other Given 06/07/18 1303)  guaiFENesin (ROBITUSSIN) 100 MG/5ML solution 300 mg (300 mg Oral Given 06/07/18  1300)     Initial Impression / Assessment and Plan / ED Course  I have reviewed the triage vital signs and the nursing notes.  Pertinent labs & imaging results that were available during my care of the patient were reviewed by me and considered in my medical decision making (see chart for details).      No wheezing, normal VS, well appearing. CXR w/ viral changes, no infiltrate.  Spent a long time counseling patient that in setting of viral illness, cough can last 1-1.5 weeks and no medications including tamiflu will necessarily make it go away faster. Will provide with less expensive cough suppressant, also trial of inhaler to see if it helps. I hear no wheezing to necessitate steroids. I did recommend repeating CXR as he states no improvement and has uncontrolled HIV. All of his symptoms are c/w viral URI and I feel that PE or other life-threatening process is extremely unlikely based on history of illness and normal VS today. He scheduled an ID clinic appt for tomorrow.  Return Precautions reviewed.  Final Clinical Impressions(s) / ED Diagnoses   Final diagnoses:  Viral URI with cough    ED Discharge Orders         Ordered    promethazine-dextromethorphan (PROMETHAZINE-DM) 6.25-15 MG/5ML syrup  4 times daily PRN     06/07/18 1329            , Ambrose Finlandachel Morgan, MD 06/07/18 1332

## 2018-06-07 NOTE — ED Notes (Signed)
Patient transported to X-ray 

## 2018-06-07 NOTE — ED Triage Notes (Signed)
Pt returns to the ER with c/o productive cough with chest pain.   "I feel like I wasted time coming here, and I have received inaccurate care and information. They sent me home with percocet and I still had a fever."  Speaking full sentences, refuses a chest xray and wants something for cough.

## 2018-06-08 ENCOUNTER — Other Ambulatory Visit: Payer: Medicare Other

## 2018-06-08 DIAGNOSIS — Z113 Encounter for screening for infections with a predominantly sexual mode of transmission: Secondary | ICD-10-CM

## 2018-06-08 DIAGNOSIS — B20 Human immunodeficiency virus [HIV] disease: Secondary | ICD-10-CM

## 2018-06-08 DIAGNOSIS — Z79899 Other long term (current) drug therapy: Secondary | ICD-10-CM | POA: Diagnosis not present

## 2018-06-08 LAB — T-HELPER CELL (CD4) - (RCID CLINIC ONLY)
CD4 % Helper T Cell: 22 % — ABNORMAL LOW (ref 33–55)
CD4 T Cell Abs: 440 /uL (ref 400–2700)

## 2018-06-12 LAB — LIPID PANEL
Cholesterol: 108 mg/dL (ref <200)
HDL: 26 mg/dL — AB (ref >40)
LDL Cholesterol (Calc): 66 mg/dL (calc)
Non-HDL Cholesterol (Calc): 82 mg/dL (calc) (ref <130)
Total CHOL/HDL Ratio: 4.2 (calc) (ref <5.0)
Triglycerides: 80 mg/dL (ref <150)

## 2018-06-12 LAB — HIV-1 RNA QUANT-NO REFLEX-BLD
HIV 1 RNA Quant: 1420 copies/mL — ABNORMAL HIGH
HIV-1 RNA Quant, Log: 3.15 Log copies/mL — ABNORMAL HIGH

## 2018-06-12 LAB — COMPREHENSIVE METABOLIC PANEL
AG RATIO: 0.7 (calc) — AB (ref 1.0–2.5)
ALBUMIN MSPROF: 3.6 g/dL (ref 3.6–5.1)
ALKALINE PHOSPHATASE (APISO): 51 U/L (ref 40–115)
ALT: 24 U/L (ref 9–46)
AST: 30 U/L (ref 10–40)
BILIRUBIN TOTAL: 0.3 mg/dL (ref 0.2–1.2)
BUN: 10 mg/dL (ref 7–25)
CALCIUM: 9.1 mg/dL (ref 8.6–10.3)
CHLORIDE: 101 mmol/L (ref 98–110)
CO2: 31 mmol/L (ref 20–32)
Creat: 0.76 mg/dL (ref 0.60–1.35)
GLOBULIN: 5.3 g/dL — AB (ref 1.9–3.7)
Glucose, Bld: 71 mg/dL (ref 65–99)
POTASSIUM: 3.5 mmol/L (ref 3.5–5.3)
SODIUM: 137 mmol/L (ref 135–146)
TOTAL PROTEIN: 8.9 g/dL — AB (ref 6.1–8.1)

## 2018-06-12 LAB — CBC WITH DIFFERENTIAL/PLATELET
ABSOLUTE MONOCYTES: 482 {cells}/uL (ref 200–950)
BASOS PCT: 0.4 %
Basophils Absolute: 21 cells/uL (ref 0–200)
Eosinophils Absolute: 21 cells/uL (ref 15–500)
Eosinophils Relative: 0.4 %
HEMATOCRIT: 38.4 % — AB (ref 38.5–50.0)
Hemoglobin: 13.2 g/dL (ref 13.2–17.1)
LYMPHS ABS: 2056 {cells}/uL (ref 850–3900)
MCH: 29.1 pg (ref 27.0–33.0)
MCHC: 34.4 g/dL (ref 32.0–36.0)
MCV: 84.6 fL (ref 80.0–100.0)
MPV: 10.7 fL (ref 7.5–12.5)
Monocytes Relative: 9.1 %
NEUTROS PCT: 51.3 %
Neutro Abs: 2719 cells/uL (ref 1500–7800)
Platelets: 217 10*3/uL (ref 140–400)
RBC: 4.54 10*6/uL (ref 4.20–5.80)
RDW: 12.9 % (ref 11.0–15.0)
Total Lymphocyte: 38.8 %
WBC: 5.3 10*3/uL (ref 3.8–10.8)

## 2018-06-12 LAB — RPR: RPR Ser Ql: NONREACTIVE

## 2018-06-14 ENCOUNTER — Encounter: Payer: Self-pay | Admitting: Infectious Diseases

## 2018-06-14 ENCOUNTER — Ambulatory Visit (INDEPENDENT_AMBULATORY_CARE_PROVIDER_SITE_OTHER): Payer: Medicare Other | Admitting: Infectious Diseases

## 2018-06-14 VITALS — BP 131/82 | HR 66 | Temp 98.1°F | Wt 263.0 lb

## 2018-06-14 DIAGNOSIS — Z113 Encounter for screening for infections with a predominantly sexual mode of transmission: Secondary | ICD-10-CM

## 2018-06-14 DIAGNOSIS — B20 Human immunodeficiency virus [HIV] disease: Secondary | ICD-10-CM | POA: Diagnosis not present

## 2018-06-14 DIAGNOSIS — Z23 Encounter for immunization: Secondary | ICD-10-CM

## 2018-06-14 DIAGNOSIS — F329 Major depressive disorder, single episode, unspecified: Secondary | ICD-10-CM

## 2018-06-14 DIAGNOSIS — Z789 Other specified health status: Secondary | ICD-10-CM | POA: Insufficient documentation

## 2018-06-14 DIAGNOSIS — F32A Depression, unspecified: Secondary | ICD-10-CM | POA: Insufficient documentation

## 2018-06-14 DIAGNOSIS — F640 Transsexualism: Secondary | ICD-10-CM

## 2018-06-14 MED ORDER — BICTEGRAVIR-EMTRICITAB-TENOFOV 50-200-25 MG PO TABS: 1.0000 | ORAL_TABLET | Freq: Every day | ORAL | 3 refills | Status: DC

## 2018-06-14 MED FILL — BIKTARVY 50-200-25 MG TABS: 50-200-25 | 30 days supply | Qty: 30 | Fill #0

## 2018-06-14 NOTE — Assessment & Plan Note (Signed)
Would like surgery.  Will refer to [facility].

## 2018-06-14 NOTE — Assessment & Plan Note (Signed)
Offered to send to counselor but wants to go to [facility] for food instead.

## 2018-06-14 NOTE — Assessment & Plan Note (Signed)
They are doing ok Will change art to biktarvy.  Offered/refused condoms.  Flu and PCV today.  rtc in 3 months.

## 2018-06-14 NOTE — Addendum Note (Signed)
Addended by: Lorenso Courier on: 06/14/2018 04:31 PM   Modules accepted: Orders

## 2018-06-14 NOTE — Progress Notes (Signed)
   Subjective:    Patient ID: Hector [name], male    DOB: 1985/11/17, 33 y.o.   MRN: [id number]  HPI  33 yo M --> F first dx with HIV 2016. Was prev being cared for in [place]. Was prev on DRVr/TRV (his only ART). He was seen at [facility] 07-2016 and started on triumeq after a med lapse.  He has had several ED visits since that time but no clinic visits. Most recently fo rflu like syndrome.  He has "fallen off" due to depression after loss of his mother.  Has been off ART. States he is ready to restart.   HIV 1 RNA Quant (copies/mL)  Date Value  06/08/2018 1,420 (H)  03/28/2017 31,600 (H)  09/18/2016 <20   CD4 T Cell Abs (/uL)  Date Value  06/08/2018 440  03/28/2017 610    Review of Systems  Constitutional: Positive for appetite change and chills. Negative for fever and unexpected weight change.  Respiratory: Positive for cough and shortness of breath.   Gastrointestinal: Negative for constipation and diarrhea.  Genitourinary: Negative for difficulty urinating.  Psychiatric/Behavioral: Positive for dysphoric mood.  Please see HPI. All other systems reviewed and negative.     Objective:   Physical Exam Constitutional:      Appearance: Normal appearance.  HENT:     Mouth/Throat:     Pharynx: No oropharyngeal exudate.  Eyes:     Extraocular Movements: Extraocular movements intact.     Pupils: Pupils are equal, round, and reactive to light.  Neck:     Musculoskeletal: Normal range of motion and neck supple.  Cardiovascular:     Rate and Rhythm: Normal rate and regular rhythm.  Pulmonary:     Effort: Pulmonary effort is normal.     Breath sounds: Normal breath sounds.  Abdominal:     General: Bowel sounds are normal. There is no distension.     Palpations: Abdomen is soft.     Tenderness: There is no abdominal tenderness.  Musculoskeletal:        General: No swelling.  Neurological:     General: No focal deficit present.     Mental Status: He is alert.  Psychiatric:         Mood and Affect: Mood normal.       Assessment & Plan:

## 2018-06-18 ENCOUNTER — Ambulatory Visit: Payer: Self-pay | Admitting: Family Medicine

## 2018-06-25 ENCOUNTER — Telehealth: Payer: Self-pay | Admitting: Behavioral Health

## 2018-06-25 NOTE — Telephone Encounter (Signed)
Patient called inquiring about a referral to endocrinology.  Patient states he would like to be seen somewhere in [place] and not at [facility].  Patient states he would like to talk to someone about starting hormone therapy. Angeline Slim RN

## 2018-06-26 ENCOUNTER — Other Ambulatory Visit: Payer: Self-pay | Admitting: Infectious Diseases

## 2018-06-26 DIAGNOSIS — F640 Transsexualism: Secondary | ICD-10-CM

## 2018-06-26 DIAGNOSIS — Z789 Other specified health status: Secondary | ICD-10-CM

## 2018-07-09 MED FILL — BIKTARVY 50-200-25 MG TABS: 50-200-25 | 30 days supply | Qty: 30 | Fill #1

## 2018-07-11 ENCOUNTER — Ambulatory Visit (INDEPENDENT_AMBULATORY_CARE_PROVIDER_SITE_OTHER): Payer: Medicare Other | Admitting: Endocrinology

## 2018-07-11 ENCOUNTER — Encounter: Payer: Self-pay | Admitting: Endocrinology

## 2018-07-11 VITALS — BP 144/72 | HR 69 | Ht 70.0 in | Wt 264.0 lb

## 2018-07-11 DIAGNOSIS — F329 Major depressive disorder, single episode, unspecified: Secondary | ICD-10-CM

## 2018-07-11 DIAGNOSIS — F640 Transsexualism: Secondary | ICD-10-CM | POA: Diagnosis not present

## 2018-07-11 DIAGNOSIS — Z789 Other specified health status: Secondary | ICD-10-CM

## 2018-07-11 LAB — TSH: TSH: 1.7 u[IU]/mL (ref 0.35–4.50)

## 2018-07-11 MED ORDER — ESTRADIOL 0.5 MG PO TABS: 0.5000 mg | ORAL_TABLET | Freq: Every day | ORAL | 5 refills | Status: DC

## 2018-07-11 NOTE — Progress Notes (Signed)
Subjective:    Patient ID: Hector [name], male    DOB: 06-Nov-1985, 33 y.o.   MRN: [id number]  HPI Pt is referred by Dr Ninetta Lights, for transgender state (M to F).  Pt reports she first suspected the transgender state at childhood.  She had puberty at the age of 55.  She has never had surgery, counseling, or medication for this.  She has no children.  She has no h/o DVT, dyslipidemia, liver disease, kidney disease, heart disease, CVA, diabetes, gallbladder disease, blood disorders, osteoporosis, or cancer.  She quit smoking in 2018.  She has moderate hair growth  At the face, but no assoc acne. Past Medical History:  Diagnosis Date  . Depression   . Headache   . History of fractured vertebra april 8th Diagnosed   Larey Seat out of a tree while climbing it.  . Human immunodeficiency virus I infection (HCC) 2016  . Hypertension   . Mental disorder   . Myocardial infarction [facility])     Past Surgical History:  Procedure Laterality Date  . NO PAST SURGERIES      Social History   Socioeconomic History  . Marital status: Single    Spouse name: Not on file  . Number of children: Not on file  . Years of education: Not on file  . Highest education level: Not on file  Occupational History  . Not on file  Social Needs  . Financial resource strain: Not on file  . Food insecurity:    Worry: Not on file    Inability: Not on file  . Transportation needs:    Medical: Not on file    Non-medical: Not on file  Tobacco Use  . Smoking status: Never Smoker  . Smokeless tobacco: Never Used  Substance and Sexual Activity  . Alcohol use: No  . Drug use: No  . Sexual activity: Never    Comment: declined   Lifestyle  . Physical activity:    Days per week: Not on file    Minutes per session: Not on file  . Stress: Not on file  Relationships  . Social connections:    Talks on phone: Not on file    Gets together: Not on file    Attends religious service: Not on file    Active member of club or  organization: Not on file    Attends meetings of clubs or organizations: Not on file    Relationship status: Not on file  . Intimate partner violence:    Fear of current or ex partner: Not on file    Emotionally abused: Not on file    Physically abused: Not on file    Forced sexual activity: Not on file  Other Topics Concern  . Not on file  Social History Narrative  . Not on file    Current Outpatient Medications on File Prior to Visit  Medication Sig Dispense Refill  . bictegravir-emtricitabine-tenofovir AF (BIKTARVY) 50-200-25 MG TABS tablet Take 1 tablet by mouth daily. 90 tablet 3   No current facility-administered medications on file prior to visit.     Allergies  Allergen Reactions  . Tomato Anaphylaxis and Rash  . Risperdal [Risperidone] Nausea And Vomiting  . Aspirin Itching    Family History  Problem Relation Age of Onset  . CAD Mother 66       MI  . Endocrine Disorder Neg Hx     BP (!) 144/72 (BP Location: Left Arm, Patient Position: Sitting, Cuff Size: Large)  Pulse 69   Ht 5\' 10"  (1.778 m)   Wt 264 lb (119.7 kg)   SpO2 97%   BMI 37.88 kg/m     Review of Systems Denies weight change, headache, visual loss, chest pain, sob, nausea, hematuria, easy bruising, seizure, edema, myalgias, anxiety, heat intolerance, rhinorrhea, and excessive diaphoresis.     Objective:   Physical Exam VS: see vs page GEN: no distress HEAD: head: no deformity eyes: no periorbital swelling, no proptosis.   external nose and ears are normal mouth: no lesion seen NECK: supple, thyroid is not enlarged CHEST WALL: no deformity LUNGS: clear to auscultation BREASTS:  No gynecomastia. CV: reg rate and rhythm, no murmur ABD: abdomen is soft, nontender.  no hepatosplenomegaly.  not distended.  no hernia GENITALIA:  Normal male.   MUSCULOSKELETAL: muscle bulk and strength are grossly normal.  no obvious joint swelling.  gait is normal and steady EXTEMITIES: no deformity.  no  ulcer on the feet.  feet are of normal color and temp.  no leg edema PULSES: dorsalis pedis intact bilat.  no carotid bruit NEURO:  cn 2-12 grossly intact.   readily moves all 4's.  sensation is intact to touch on the feet SKIN:  Normal texture and temperature.  No rash or suspicious lesion is visible.  Normal male hair distribution. NODES:  None palpable at the neck PSYCH: alert, well-oriented.  Does not appear anxious nor depressed.     Lab Results  Component Value Date   TSH 1.70 07/11/2018   Lab Results  Component Value Date   TESTOSTERONE 226 (L) 07/11/2018   I have reviewed outside records, and summarized: Pt was noted to have transgender state, and referred here.  HIV was also addressed.        Assessment & Plan:  HTN: is noted today.  This increases the risk of E2 rx.  Transgender state:  Pt requests refs to urol, to consider orchiectomy, and to phychologist.   I agree  Patient Instructions  Your blood pressure is high today.  Please see your primary care provider soon, to have it rechecked I have sent a prescription to your pharmacy, for the estrogen pill.   You should take 1/2 pill for the first 2 weeks.   Please come back for a follow-up appointment in 4-6 weeks.  hormone treatment has risks, including blood clots in the legs.  Please call if you have swelling of the legs.   Please see 2 specialists.  you will receive a phone call, about a day and time for an appointment.

## 2018-07-11 NOTE — Patient Instructions (Addendum)
Your blood pressure is high today.  Please see your primary care provider soon, to have it rechecked I have sent a prescription to your pharmacy, for the estrogen pill.   You should take 1/2 pill for the first 2 weeks.   Please come back for a follow-up appointment in 4-6 weeks.  hormone treatment has risks, including blood clots in the legs.  Please call if you have swelling of the legs.   Please see 2 specialists.  you will receive a phone call, about a day and time for an appointment.

## 2018-07-13 LAB — TESTOSTERONE,FREE AND TOTAL
Testosterone, Free: 4.3 pg/mL — ABNORMAL LOW (ref 8.7–25.1)
Testosterone: 226 ng/dL — ABNORMAL LOW (ref 264–916)

## 2018-07-18 LAB — ESTRADIOL, FREE
Estradiol, Free: 16.21 pg/mL — ABNORMAL HIGH
Estradiol: 863 pg/mL — ABNORMAL HIGH

## 2018-07-24 ENCOUNTER — Telehealth: Payer: Self-pay | Admitting: Endocrinology

## 2018-07-24 MED ORDER — ESTRADIOL 1 MG PO TABS: 1.0000 mg | ORAL_TABLET | Freq: Every day | ORAL | 5 refills | Status: DC

## 2018-07-24 NOTE — Telephone Encounter (Signed)
Called pt and made him aware. Agreeable to increasing dosage to 1mg  daily. Requested Rx be sent to [facility] [address]

## 2018-07-24 NOTE — Telephone Encounter (Signed)
OK, I have sent a prescription to your pharmacy.  

## 2018-07-24 NOTE — Addendum Note (Signed)
Addended by: Romero Belling on: 07/24/2018 04:52 PM   Modules accepted: Orders

## 2018-07-24 NOTE — Telephone Encounter (Signed)
Returned pt call. States Estradiol is not working. Requesting something stronger. Also wants something "not in pill form."

## 2018-07-24 NOTE — Telephone Encounter (Signed)
It is not safe to take the estrogen injections, but I would be happy to double the pill.

## 2018-07-24 NOTE — Telephone Encounter (Signed)
Patient is requesting a call back to discuss his medication  Please advise

## 2018-07-25 ENCOUNTER — Other Ambulatory Visit: Payer: Self-pay

## 2018-07-25 NOTE — Telephone Encounter (Signed)
Called pt and he wanted clarification on what estradiol does and how is works in his body. It was explained to the patient that being in transition, the patient needs to take the medication as prescribed and slowly build up an appropriate level of the hormone.

## 2018-07-25 NOTE — Telephone Encounter (Signed)
Patient is calling requesting to speak with a nurse about a clarification on medications.  Please Advise, Thanks

## 2018-07-26 ENCOUNTER — Ambulatory Visit: Payer: Medicare Other | Admitting: Family Medicine

## 2018-08-07 MED FILL — BIKTARVY 50-200-25 MG TABS: 50-200-25 | 30 days supply | Qty: 30 | Fill #2

## 2018-08-08 ENCOUNTER — Ambulatory Visit: Payer: Self-pay | Admitting: Endocrinology

## 2018-08-14 ENCOUNTER — Other Ambulatory Visit: Payer: Self-pay

## 2018-08-15 ENCOUNTER — Ambulatory Visit (INDEPENDENT_AMBULATORY_CARE_PROVIDER_SITE_OTHER): Payer: Medicare Other | Admitting: Endocrinology

## 2018-08-15 ENCOUNTER — Other Ambulatory Visit: Payer: Self-pay

## 2018-08-15 ENCOUNTER — Ambulatory Visit: Payer: Self-pay | Admitting: Endocrinology

## 2018-08-15 VITALS — BP 138/90 | HR 74 | Temp 97.9°F | Resp 12 | Wt 274.0 lb

## 2018-08-15 DIAGNOSIS — Z789 Other specified health status: Secondary | ICD-10-CM

## 2018-08-15 DIAGNOSIS — F640 Transsexualism: Secondary | ICD-10-CM

## 2018-08-15 MED ORDER — SPIRONOLACTONE 50 MG PO TABS: 50.0000 mg | ORAL_TABLET | Freq: Every day | ORAL | 11 refills | Status: DC

## 2018-08-15 NOTE — Patient Instructions (Addendum)
Your blood pressure is high today.  Please see your primary care provider soon, to have it rechecked.   I have sent a prescription to your pharmacy, to add another pill.  This works Land.   Please come back for a follow-up appointment in 2 months.  Please see a urology specialist.  you will receive a phone call, about a day and time for an appointment.

## 2018-08-15 NOTE — Progress Notes (Signed)
Subjective:    Patient ID: Hector [name], male    DOB: 1985-12-24, 33 y.o.   MRN: [id number]  HPI  Pt returns for f/u of transgender state (M to F); she had puberty at the age of 55; she has never had transgender surgery or counseling; she was rx'ed oral E2 in 2020; she quit smoking in 2018; since on E2, she has slight pain at the breast areas, but no assoc swelling.  Facial hair growth is less now.   Past Medical History:  Diagnosis Date  . Depression   . Headache   . History of fractured vertebra april 8th Diagnosed   Larey Seat out of a tree while climbing it.  . Human immunodeficiency virus I infection (HCC) 2016  . Hypertension   . Mental disorder   . Myocardial infarction [facility])     Past Surgical History:  Procedure Laterality Date  . NO PAST SURGERIES      Social History   Socioeconomic History  . Marital status: Single    Spouse name: Not on file  . Number of children: Not on file  . Years of education: Not on file  . Highest education level: Not on file  Occupational History  . Not on file  Social Needs  . Financial resource strain: Not on file  . Food insecurity:    Worry: Not on file    Inability: Not on file  . Transportation needs:    Medical: Not on file    Non-medical: Not on file  Tobacco Use  . Smoking status: Never Smoker  . Smokeless tobacco: Never Used  Substance and Sexual Activity  . Alcohol use: No  . Drug use: No  . Sexual activity: Never    Comment: declined   Lifestyle  . Physical activity:    Days per week: Not on file    Minutes per session: Not on file  . Stress: Not on file  Relationships  . Social connections:    Talks on phone: Not on file    Gets together: Not on file    Attends religious service: Not on file    Active member of club or organization: Not on file    Attends meetings of clubs or organizations: Not on file    Relationship status: Not on file  . Intimate partner violence:    Fear of current or ex partner: Not on  file    Emotionally abused: Not on file    Physically abused: Not on file    Forced sexual activity: Not on file  Other Topics Concern  . Not on file  Social History Narrative  . Not on file    Current Outpatient Medications on File Prior to Visit  Medication Sig Dispense Refill  . bictegravir-emtricitabine-tenofovir AF (BIKTARVY) 50-200-25 MG TABS tablet Take 1 tablet by mouth daily. 90 tablet 3   No current facility-administered medications on file prior to visit.     Allergies  Allergen Reactions  . Tomato Anaphylaxis and Rash  . Risperdal [Risperidone] Nausea And Vomiting  . Aspirin Itching    Family History  Problem Relation Age of Onset  . CAD Mother 36       MI  . Endocrine Disorder Neg Hx     BP 138/90 (BP Location: Left Arm, Patient Position: Sitting, Cuff Size: Normal)   Pulse 74   Temp 97.9 F (36.6 C)   Resp 12   Wt 274 lb (124.3 kg)   SpO2 98%  BMI 39.31 kg/m   Review of Systems Denies sob and leg swelling.      Objective:   Physical Exam VITAL SIGNS:  See vs page GENERAL: no distress.   Ext: no leg edema   Lab Results  Component Value Date   CREATININE 0.76 06/08/2018   BUN 10 06/08/2018   NA 137 06/08/2018   K 3.5 06/08/2018   CL 101 06/08/2018   CO2 31 06/08/2018   E2=863    Assessment & Plan:  High estradiol. We cannot increase rx now.  HTN: is noted today.  Transgender state: improved, but she needs increased rx.   Patient Instructions  Your blood pressure is high today.  Please see your primary care provider soon, to have it rechecked.   I have sent a prescription to your pharmacy, to add another pill.  This works Land.   Please come back for a follow-up appointment in 2 months.  Please see a urology specialist.  you will receive a phone call, about a day and time for an appointment.

## 2018-08-17 ENCOUNTER — Other Ambulatory Visit: Payer: Self-pay | Admitting: Endocrinology

## 2018-08-30 ENCOUNTER — Other Ambulatory Visit: Payer: Self-pay

## 2018-08-30 ENCOUNTER — Other Ambulatory Visit (HOSPITAL_COMMUNITY)
Admission: RE | Admit: 2018-08-30 | Discharge: 2018-08-30 | Disposition: A | Discharge status: Home / Self Care | Payer: Medicare Other | Source: Ambulatory Visit | Attending: Infectious Diseases | Admitting: Infectious Diseases

## 2018-08-30 ENCOUNTER — Other Ambulatory Visit: Payer: Medicare Other

## 2018-08-30 DIAGNOSIS — Z113 Encounter for screening for infections with a predominantly sexual mode of transmission: Secondary | ICD-10-CM | POA: Diagnosis not present

## 2018-08-30 DIAGNOSIS — B20 Human immunodeficiency virus [HIV] disease: Secondary | ICD-10-CM

## 2018-08-31 LAB — T-HELPER CELL (CD4) - (RCID CLINIC ONLY)
CD4 % Helper T Cell: 26 % — ABNORMAL LOW (ref 33–55)
CD4 T Cell Abs: 650 /uL (ref 400–2700)

## 2018-09-03 LAB — COMPLETE METABOLIC PANEL WITH GFR
AG Ratio: 0.9 (calc) — ABNORMAL LOW (ref 1.0–2.5)
ALT: 17 U/L (ref 9–46)
AST: 15 U/L (ref 10–40)
Albumin: 4 g/dL (ref 3.6–5.1)
Alkaline phosphatase (APISO): 60 U/L (ref 36–130)
BUN: 15 mg/dL (ref 7–25)
CO2: 28 mmol/L (ref 20–32)
Calcium: 9.4 mg/dL (ref 8.6–10.3)
Chloride: 103 mmol/L (ref 98–110)
Creat: 0.81 mg/dL (ref 0.60–1.35)
GFR, Est African American: 136 mL/min/{1.73_m2} (ref >=60)
GFR, Est Non African American: 118 mL/min/{1.73_m2} (ref >=60)
Globulin: 4.7 g/dL (calc) — ABNORMAL HIGH (ref 1.9–3.7)
Glucose, Bld: 90 mg/dL (ref 65–99)
Potassium: 3.6 mmol/L (ref 3.5–5.3)
Sodium: 135 mmol/L (ref 135–146)
Total Bilirubin: 0.2 mg/dL (ref 0.2–1.2)
Total Protein: 8.7 g/dL — ABNORMAL HIGH (ref 6.1–8.1)

## 2018-09-03 LAB — CBC
HCT: 36.7 % — ABNORMAL LOW (ref 38.5–50.0)
Hemoglobin: 12.6 g/dL — ABNORMAL LOW (ref 13.2–17.1)
MCH: 29.8 pg (ref 27.0–33.0)
MCHC: 34.3 g/dL (ref 32.0–36.0)
MCV: 86.8 fL (ref 80.0–100.0)
MPV: 10.9 fL (ref 7.5–12.5)
Platelets: 238 10*3/uL (ref 140–400)
RBC: 4.23 10*6/uL (ref 4.20–5.80)
RDW: 13.9 % (ref 11.0–15.0)
WBC: 8.9 10*3/uL (ref 3.8–10.8)

## 2018-09-03 LAB — RPR: RPR Ser Ql: NONREACTIVE

## 2018-09-03 LAB — URINE CYTOLOGY ANCILLARY ONLY
Chlamydia: NEGATIVE
Neisseria Gonorrhea: NEGATIVE

## 2018-09-03 LAB — HIV-1 RNA QUANT-NO REFLEX-BLD
HIV 1 RNA Quant: 39 copies/mL — ABNORMAL HIGH
HIV-1 RNA Quant, Log: 1.59 Log copies/mL — ABNORMAL HIGH

## 2018-09-11 MED FILL — BIKTARVY 50-200-25 MG TABS: 50-200-25 | 30 days supply | Qty: 30 | Fill #3

## 2018-09-13 ENCOUNTER — Other Ambulatory Visit: Payer: Self-pay

## 2018-09-13 ENCOUNTER — Encounter: Payer: Self-pay | Admitting: Infectious Diseases

## 2018-09-13 ENCOUNTER — Ambulatory Visit (INDEPENDENT_AMBULATORY_CARE_PROVIDER_SITE_OTHER): Payer: Medicare Other | Admitting: Infectious Diseases

## 2018-09-13 DIAGNOSIS — I10 Essential (primary) hypertension: Secondary | ICD-10-CM | POA: Diagnosis not present

## 2018-09-13 DIAGNOSIS — B20 Human immunodeficiency virus [HIV] disease: Secondary | ICD-10-CM | POA: Diagnosis not present

## 2018-09-13 DIAGNOSIS — F329 Major depressive disorder, single episode, unspecified: Secondary | ICD-10-CM | POA: Diagnosis not present

## 2018-09-13 DIAGNOSIS — Z789 Other specified health status: Secondary | ICD-10-CM

## 2018-09-13 DIAGNOSIS — F640 Transsexualism: Secondary | ICD-10-CM

## 2018-09-13 NOTE — Assessment & Plan Note (Signed)
States he is doing well off anti-htn rx.  Will watch.

## 2018-09-13 NOTE — Progress Notes (Signed)
   Subjective:    Patient ID: Hector [name], adult    DOB: 1985-12-14, 33 y.o.   MRN: [id number]  HPI 33 yo M --> F first dx with HIV 2016. Was prev being cared for in [place]. Was prev on DRVr/TRV (his only ART). He was seen at [facility] 07-2016 and started on triumeq after a med lapse.   Had "fallen off" due to depression after loss of his mother.  Today has "good days and mean days". Attributes mood swings to hormones.   S/he was restarted on biktarvy 05-2018. Denies missed.  Has been stated on anti-htn rx since last visit. Then stopped.  Has seen endo for transgender issues.    HIV 1 RNA Quant (copies/mL)  Date Value  08/30/2018 39 (H)  06/08/2018 1,420 (H)  03/28/2017 31,600 (H)   CD4 T Cell Abs (/uL)  Date Value  08/30/2018 650  06/08/2018 440  03/28/2017 610    Review of Systems  Constitutional: Negative for appetite change, chills, fever and unexpected weight change.  Respiratory: Negative for cough and shortness of breath.   Gastrointestinal: Negative for constipation and diarrhea.  Genitourinary: Negative for difficulty urinating.  Psychiatric/Behavioral: Negative for dysphoric mood.  Please see HPI. All other systems reviewed and negative.      Objective:   Physical Exam Phone visit.        Assessment & Plan:

## 2018-09-13 NOTE — Assessment & Plan Note (Signed)
He is doing well Encouraged him to be adherent.  His vax hx is updated.  States he is not sexually active.  Will see him in 6 months.

## 2018-09-13 NOTE — Assessment & Plan Note (Signed)
Appreciate endo f/u.  

## 2018-09-13 NOTE — Assessment & Plan Note (Signed)
Mood overall is better despite his hormone related fluctuations.

## 2018-10-01 ENCOUNTER — Encounter: Payer: Medicare Other | Admitting: Endocrinology

## 2018-10-01 ENCOUNTER — Other Ambulatory Visit: Payer: Self-pay

## 2018-10-01 ENCOUNTER — Encounter: Payer: Self-pay | Admitting: Endocrinology

## 2018-10-03 ENCOUNTER — Telehealth: Payer: Self-pay

## 2018-10-03 NOTE — Telephone Encounter (Signed)
NS for 10/01/18 appt. LVM requesting returned call re: rescheduling appt 

## 2018-10-17 ENCOUNTER — Ambulatory Visit: Payer: Self-pay | Admitting: Endocrinology

## 2018-10-22 ENCOUNTER — Other Ambulatory Visit: Payer: Self-pay

## 2018-10-22 ENCOUNTER — Telehealth: Payer: Self-pay | Admitting: Endocrinology

## 2018-10-22 MED ORDER — ESTRADIOL 1 MG PO TABS: 1.0000 mg | ORAL_TABLET | Freq: Every day | ORAL | 2 refills | Status: DC

## 2018-10-22 NOTE — Telephone Encounter (Signed)
MEDICATION: estradiol (ESTRACE) 1 MG tablet  PHARMACY[facility]   IS THIS A 90 DAY SUPPLY :   IS PATIENT OUT OF MEDICATION: Yes  IF NOT; HOW MUCH IS LEFT:   LAST APPOINTMENT DATE: @5 /13/2020  NEXT APPOINTMENT DATE:@6 /08/2018  DO WE HAVE YOUR PERMISSION TO LEAVE A DETAILED MESSAGE:  OTHER COMMENTS:    **Let patient know to contact pharmacy at the end of the day to make sure medication is ready. **  ** Please notify patient to allow 48-72 hours to process**  **Encourage patient to contact the pharmacy for refills or they can request refills through [facility]**

## 2018-10-22 NOTE — Telephone Encounter (Signed)
estradiol (ESTRACE) 1 MG tablet 90 tablet 2 10/22/2018    Sig - Route: Take 1 tablet (1 mg total) by mouth daily. - Oral   Sent to pharmacy as: estradiol (ESTRACE) 1 MG tablet   E-Prescribing Status: Receipt confirmed by pharmacy (10/22/2018 2:41 PM EDT)

## 2018-10-23 NOTE — Telephone Encounter (Signed)
Patient returned call in regards to doing a virtual visit. Patient then wanted to confirm RX refill then requested to speak with the nurse about some concerns. Declined to specify.  Please Advise, Thanks

## 2018-10-23 NOTE — Telephone Encounter (Signed)
Returned pt call. Wanted to compare estrogen levels. Unfortunately, only had a baseline with no comparison. Pt advised having less hair growth, increased breast volume and less breast pain. Advised it sounds as though the effects from the medication are positive. Advised to keep follow up appt for lab and assessment. Verbalized acceptance and understanding.

## 2018-10-24 MED FILL — BIKTARVY 50-200-25 MG TABS: 50-200-25 | 30 days supply | Qty: 30 | Fill #4

## 2018-10-25 ENCOUNTER — Other Ambulatory Visit: Payer: Self-pay

## 2018-10-25 ENCOUNTER — Ambulatory Visit (INDEPENDENT_AMBULATORY_CARE_PROVIDER_SITE_OTHER): Payer: Medicare Other | Admitting: Endocrinology

## 2018-10-25 DIAGNOSIS — F640 Transsexualism: Secondary | ICD-10-CM | POA: Diagnosis not present

## 2018-10-25 DIAGNOSIS — Z789 Other specified health status: Secondary | ICD-10-CM

## 2018-10-25 NOTE — Patient Instructions (Addendum)
Blood tests are requested for you today.  We'll let you know about the results.  Based on the results, we can increase the estrogen, or we can add "finasteride."  Please come back for a follow-up appointment in 6 months.

## 2018-10-25 NOTE — Progress Notes (Signed)
Subjective:    Hector [name] ID: Hector [name], adult    DOB: 05/12/1986, 33 y.o.   MRN: [id number]  HPI telehealth visit today via doxy video visit.  Alternatives to telehealth are presented to this Hector [name], and the Hector [name] agrees to the telehealth visit.  Pt is advised of the cost of the visit, and agrees to this, also.   Hector [name] is at home, and I am at the office.   Persons attending the telehealth visit: the Hector [name] and I Pt returns for f/u of transgender state (M to F); she had puberty at the age of 33; she has never had transgender surgery or counseling; she was rx'ed oral E2 in 2020; she quit smoking in 2018; since on E2, she has slight growth at the breast areas, but no assoc pain.   Past Medical History:  Diagnosis Date  . Depression   . Headache   . History of fractured vertebra april 8th Diagnosed   Larey SeatFell out of a tree while climbing it.  . Human immunodeficiency virus I infection (HCC) 2016  . Hypertension   . Mental disorder   . Myocardial infarction [facility](HCC)     Past Surgical History:  Procedure Laterality Date  . NO PAST SURGERIES      Social History   Socioeconomic History  . Marital status: Single    Spouse name: Not on file  . Number of children: Not on file  . Years of education: Not on file  . Highest education level: Not on file  Occupational History  . Not on file  Social Needs  . Financial resource strain: Not on file  . Food insecurity:    Worry: Not on file    Inability: Not on file  . Transportation needs:    Medical: Not on file    Non-medical: Not on file  Tobacco Use  . Smoking status: Never Smoker  . Smokeless tobacco: Never Used  Substance and Sexual Activity  . Alcohol use: No  . Drug use: No  . Sexual activity: Never    Comment: declined   Lifestyle  . Physical activity:    Days per week: Not on file    Minutes per session: Not on file  . Stress: Not on file  Relationships  . Social connections:    Talks on phone: Not on file   Gets together: Not on file    Attends religious service: Not on file    Active member of club or organization: Not on file    Attends meetings of clubs or organizations: Not on file    Relationship status: Not on file  . Intimate partner violence:    Fear of current or ex partner: Not on file    Emotionally abused: Not on file    Physically abused: Not on file    Forced sexual activity: Not on file  Other Topics Concern  . Not on file  Social History Narrative  . Not on file    Current Outpatient Medications on File Prior to Visit  Medication Sig Dispense Refill  . bictegravir-emtricitabine-tenofovir AF (BIKTARVY) 50-200-25 MG TABS tablet Take 1 tablet by mouth daily. 90 tablet 3  . estradiol (ESTRACE) 1 MG tablet Take 1 tablet (1 mg total) by mouth daily. 90 tablet 2   No current facility-administered medications on file prior to visit.     Allergies  Allergen Reactions  . Tomato Anaphylaxis and Rash  . Risperdal [Risperidone] Nausea And Vomiting  . Aspirin Itching  Family History  Problem Relation Age of Onset  . CAD Mother 19       MI  . Endocrine Disorder Neg Hx      Review of Systems Facial hair continues to decrease.  Denies leg swelling.      Objective:   Physical Exam      Assessment & Plan:  Transgender state: she needs increased rx.  Hair growth: improved.   Hector [name] Instructions  Blood tests are requested for you today.  We'll let you know about the results.  Based on the results, we can increase the estrogen, or we can add "finasteride."  Please come back for a follow-up appointment in 6 months.

## 2018-11-21 MED FILL — BIKTARVY 50-200-25 MG TABS: 50-200-25 | 30 days supply | Qty: 30 | Fill #5

## 2018-12-12 ENCOUNTER — Telehealth: Payer: Self-pay | Admitting: Endocrinology

## 2018-12-12 NOTE — Telephone Encounter (Signed)
Your last estradiol level was high on the pills.  It is not safe to increase.

## 2018-12-12 NOTE — Telephone Encounter (Signed)
Please review pt request and advise 

## 2018-12-12 NOTE — Telephone Encounter (Signed)
Patient ph#  [phone] called [id number] (ESTRACE) 1 MG tablet is not working. Patient requests estradiol vial type medication/hormones to inject instead (40 mg). Please call patient at the above ph# to advise.

## 2018-12-13 ENCOUNTER — Other Ambulatory Visit: Payer: Self-pay

## 2018-12-13 ENCOUNTER — Encounter: Payer: Self-pay | Admitting: Endocrinology

## 2018-12-13 ENCOUNTER — Telehealth: Payer: Self-pay

## 2018-12-13 ENCOUNTER — Encounter: Payer: Medicare Other | Admitting: Endocrinology

## 2018-12-13 NOTE — Telephone Encounter (Signed)
Pt was scheduled for VV 12/13/18 @ 10:45am. This pt's name did not appear in Dr. Cordelia Pen Doxy que. At Dr. Cordelia Pen request, called pt to attempt to have pt connect to Doxy or reschedule. LVM requesting returned call. At the time of this entry, Dr. Loanne Drilling approached and informed he is leaving for the day. Will be happy to see pt tomorrow if there is availability. This message forwarded to [employer] for scheduling purposes.

## 2018-12-13 NOTE — Progress Notes (Signed)
   Subjective:    Patient ID: Hector [name], adult    DOB: [birth year], 33 y.o.   MRN: [id number]  HPI    Review of Systems     Objective:   Physical Exam        Assessment & Plan:

## 2018-12-13 NOTE — Telephone Encounter (Signed)
Called pt and informed of Dr. Cordelia Pen recommendation. Pt states he sees no benefit to taking Estradiol if it is not going to work as intended. Feels male hormones are overriding and has seen no improvement. Wants to further discuss with Dr. Loanne Drilling. Scheduled for VV today to address concerns.

## 2018-12-14 ENCOUNTER — Telehealth: Payer: Self-pay | Admitting: Endocrinology

## 2018-12-14 NOTE — Telephone Encounter (Signed)
Patient was rescheduled-no need to send message

## 2018-12-18 ENCOUNTER — Ambulatory Visit (INDEPENDENT_AMBULATORY_CARE_PROVIDER_SITE_OTHER): Payer: Medicare Other | Admitting: Endocrinology

## 2018-12-18 ENCOUNTER — Other Ambulatory Visit: Payer: Self-pay

## 2018-12-18 ENCOUNTER — Encounter: Payer: Self-pay | Admitting: Endocrinology

## 2018-12-18 VITALS — Ht 70.0 in | Wt 240.0 lb

## 2018-12-18 DIAGNOSIS — Z789 Other specified health status: Secondary | ICD-10-CM

## 2018-12-18 DIAGNOSIS — F640 Transsexualism: Secondary | ICD-10-CM

## 2018-12-18 MED ORDER — ESTRADIOL 1 MG PO TABS: 1.0000 mg | ORAL_TABLET | Freq: Every day | ORAL | 2 refills | Status: DC

## 2018-12-18 NOTE — Patient Instructions (Addendum)
I have sent a prescription to your pharmacy, to refill the estrogen pills.  Blood tests are requested for you today.  Please have drawn in approx 2 weeks.  We'll let you know about the results.   These medications are the best we can do until you get the testicles are removed.   Please come back for a follow-up appointment in 6 months.

## 2018-12-18 NOTE — Progress Notes (Signed)
Subjective:    Patient ID: Hector [name], adult    DOB: 07/31/1985, 33 y.o.   MRN: [id number]  HPI telehealth visit today via doxy video visit.  Alternatives to telehealth are presented to this patient, and the patient agrees to the telehealth visit.  Pt is advised of the cost of the visit, and agrees to this, also.   Patient is at home, and I am at the office.   Persons attending the telehealth visit: the patient and I.   Pt returns for f/u of transgender state (M to F); she had puberty at the age of 33; she has never had transgender surgery or counseling; she was rx'ed oral E2 in 2020; she quit smoking in 2018; since on E2, she has slight growth at the male genitalia, but no assoc improvement in breast growth.  Pt [facility], but they have delayed, due to coronavirus.  She ran out of the estradiol a few days ago.   Past Medical History:  Diagnosis Date  . Depression   . Headache   . History of fractured vertebra april 8th Diagnosed   [place]are[place] SeatFell out of a tree while climbing it.  . Human immunodeficiency virus I infection (HCC) 2016  . Hypertension   . Mental disorder   . Myocardial infarction [facility](HCC)     Past Surgical History:  Procedure Laterality Date  . NO PAST SURGERIES      Social History   Socioeconomic History  . Marital status: Single    Spouse name: Not on file  . Number of children: Not on file  . Years of education: Not on file  . Highest education level: Not on file  Occupational History  . Not on file  Social Needs  . Financial resource strain: Not on file  . Food insecurity    Worry: Not on file    Inability: Not on file  . Transportation needs    Medical: Not on file    Non-medical: Not on file  Tobacco Use  . Smoking status: Never Smoker  . Smokeless tobacco: Never Used  Substance and Sexual Activity  . Alcohol use: No  . Drug use: No  . Sexual activity: Never    Comment: declined   Lifestyle  . Physical activity    Days per week: Not on file    Minutes per session: Not on file  . Stress: Not on file  Relationships  . Social Musicianconnections    Talks on phone: Not on file    Gets together: Not on file    Attends religious service: Not on file    Active member of club or organization: Not on file    Attends meetings of clubs or organizations: Not on file    Relationship status: Not on file  . Intimate partner violence    Fear of current or ex partner: Not on file    Emotionally abused: Not on file    Physically abused: Not on file    Forced sexual activity: Not on file  Other Topics Concern  . Not on file  Social History Narrative  . Not on file    Current Outpatient Medications on File Prior to Visit  Medication Sig Dispense Refill  . bictegravir-emtricitabine-tenofovir AF (BIKTARVY) 50-200-25 MG TABS tablet Take 1 tablet by mouth daily. 90 tablet 3   No current facility-administered medications on file prior to visit.     Allergies  Allergen Reactions  . Tomato Anaphylaxis  and Rash  . Risperdal [Risperidone] Nausea And Vomiting  . Aspirin Itching    Family History  Problem Relation Age of Onset  . CAD Mother 43       MI  . Endocrine Disorder Neg Hx     Ht 5\' 10"  (1.778 m)   Wt 240 lb (108.9 kg)   BMI 34.44 kg/m    Review of Systems Denies leg edema    Objective:   Physical Exam       Assessment & Plan:  Transgender state: rx is limited by elev E2 level.  I advised addition of spironolactone vs finasteride vs both.  She declines for now.    Patient Instructions  I have sent a prescription to your pharmacy, to refill the estrogen pills.  Blood tests are requested for you today.  Please have drawn in approx 2 weeks.  We'll let you know about the results.   These medications are the best we can do until you get the testicles are removed.   Please come back for a follow-up appointment in 6 months.

## 2018-12-24 MED FILL — BIKTARVY 50-200-25 MG TABS: 50-200-25 | 30 days supply | Qty: 30 | Fill #6

## 2019-01-11 ENCOUNTER — Other Ambulatory Visit: Payer: Self-pay

## 2019-01-11 ENCOUNTER — Ambulatory Visit (INDEPENDENT_AMBULATORY_CARE_PROVIDER_SITE_OTHER)
Admission: EM | Admit: 2019-01-11 | Discharge: 2019-01-11 | Disposition: A | Discharge status: Home / Self Care | Payer: Medicare Other | Source: Home / Self Care | Attending: Family Medicine | Admitting: Family Medicine

## 2019-01-11 ENCOUNTER — Encounter (HOSPITAL_COMMUNITY): Payer: Self-pay

## 2019-01-11 DIAGNOSIS — B9689 Other specified bacterial agents as the cause of diseases classified elsewhere: Secondary | ICD-10-CM | POA: Diagnosis not present

## 2019-01-11 DIAGNOSIS — K625 Hemorrhage of anus and rectum: Secondary | ICD-10-CM | POA: Diagnosis not present

## 2019-01-11 DIAGNOSIS — F640 Transsexualism: Secondary | ICD-10-CM | POA: Diagnosis not present

## 2019-01-11 DIAGNOSIS — R109 Unspecified abdominal pain: Secondary | ICD-10-CM | POA: Diagnosis not present

## 2019-01-11 DIAGNOSIS — I10 Essential (primary) hypertension: Secondary | ICD-10-CM | POA: Diagnosis not present

## 2019-01-11 DIAGNOSIS — R350 Frequency of micturition: Secondary | ICD-10-CM | POA: Diagnosis not present

## 2019-01-11 DIAGNOSIS — K529 Noninfective gastroenteritis and colitis, unspecified: Secondary | ICD-10-CM | POA: Diagnosis not present

## 2019-01-11 DIAGNOSIS — Z20828 Contact with and (suspected) exposure to other viral communicable diseases: Secondary | ICD-10-CM | POA: Diagnosis not present

## 2019-01-11 DIAGNOSIS — N390 Urinary tract infection, site not specified: Secondary | ICD-10-CM

## 2019-01-11 DIAGNOSIS — R509 Fever, unspecified: Secondary | ICD-10-CM | POA: Diagnosis not present

## 2019-01-11 LAB — POCT URINALYSIS DIP (DEVICE)
Bilirubin Urine: NEGATIVE
Glucose, UA: NEGATIVE mg/dL
Ketones, ur: NEGATIVE mg/dL
Nitrite: NEGATIVE
Protein, ur: 30 mg/dL — AB
Specific Gravity, Urine: 1.02 (ref 1.005–1.030)
Urobilinogen, UA: 2 mg/dL — ABNORMAL HIGH (ref 0.0–1.0)
pH: 6.5 (ref 5.0–8.0)

## 2019-01-11 LAB — CBC WITH DIFFERENTIAL/PLATELET
Abs Immature Granulocytes: 0.12 10*3/uL — ABNORMAL HIGH (ref 0.00–0.07)
Basophils Absolute: 0.1 10*3/uL (ref 0.0–0.1)
Basophils Relative: 0 %
Eosinophils Absolute: 0.1 10*3/uL (ref 0.0–0.5)
Eosinophils Relative: 0 %
HCT: 38.5 % — ABNORMAL LOW (ref 39.0–52.0)
Hemoglobin: 13.4 g/dL (ref 13.0–17.0)
Immature Granulocytes: 1 %
Lymphocytes Relative: 11 %
Lymphs Abs: 2.2 10*3/uL (ref 0.7–4.0)
MCH: 29.7 pg (ref 26.0–34.0)
MCHC: 34.8 g/dL (ref 30.0–36.0)
MCV: 85.4 fL (ref 80.0–100.0)
Monocytes Absolute: 1.4 10*3/uL — ABNORMAL HIGH (ref 0.1–1.0)
Monocytes Relative: 7 %
Neutro Abs: 16.6 10*3/uL — ABNORMAL HIGH (ref 1.7–7.7)
Neutrophils Relative %: 81 %
Platelets: 257 10*3/uL (ref 150–400)
RBC: 4.51 MIL/uL (ref 4.22–5.81)
RDW: 12.1 % (ref 11.5–15.5)
WBC: 20.5 10*3/uL — ABNORMAL HIGH (ref 4.0–10.5)
nRBC: 0 % (ref 0.0–0.2)

## 2019-01-11 LAB — COMPREHENSIVE METABOLIC PANEL
ALT: 22 U/L (ref 0–44)
AST: 17 U/L (ref 15–41)
Albumin: 3.7 g/dL (ref 3.5–5.0)
Alkaline Phosphatase: 70 U/L (ref 38–126)
Anion gap: 8 (ref 5–15)
BUN: 11 mg/dL (ref 6–20)
CO2: 24 mmol/L (ref 22–32)
Calcium: 8.9 mg/dL (ref 8.9–10.3)
Chloride: 102 mmol/L (ref 98–111)
Creatinine, Ser: 0.98 mg/dL (ref 0.61–1.24)
GFR calc Af Amer: >60 mL/min (ref >60)
GFR calc non Af Amer: >60 mL/min (ref >60)
Glucose, Bld: 101 mg/dL — ABNORMAL HIGH (ref 70–99)
Potassium: 3.2 mmol/L — ABNORMAL LOW (ref 3.5–5.1)
Sodium: 134 mmol/L — ABNORMAL LOW (ref 135–145)
Total Bilirubin: 1 mg/dL (ref 0.3–1.2)
Total Protein: 8.4 g/dL — ABNORMAL HIGH (ref 6.5–8.1)

## 2019-01-11 LAB — GLUCOSE, CAPILLARY: Glucose-Capillary: 113 mg/dL — ABNORMAL HIGH (ref 70–99)

## 2019-01-11 LAB — LIPASE, BLOOD: Lipase: 31 U/L (ref 11–51)

## 2019-01-11 MED ORDER — LIDOCAINE VISCOUS HCL 2 % MT SOLN
15.0000 mL | Freq: Once | OROMUCOSAL | Status: AC
  Administered 2019-01-11: 18:00:00 15 mL via ORAL

## 2019-01-11 MED ORDER — OMEPRAZOLE 20 MG PO CPDR: 20.0000 mg | DELAYED_RELEASE_CAPSULE | Freq: Every day | ORAL | 0 refills | Status: DC

## 2019-01-11 MED ORDER — LIDOCAINE VISCOUS HCL 2 % MT SOLN
OROMUCOSAL | Status: AC
  Filled 2019-01-11: qty 15

## 2019-01-11 MED ORDER — ACETAMINOPHEN 325 MG PO TABS
ORAL_TABLET | ORAL | Status: AC
  Filled 2019-01-11: qty 3

## 2019-01-11 MED ORDER — CEPHALEXIN 500 MG PO CAPS: 500.0000 mg | ORAL_CAPSULE | Freq: Three times a day (TID) | ORAL | 0 refills | Status: AC

## 2019-01-11 MED ORDER — ALUM & MAG HYDROXIDE-SIMETH 200-200-20 MG/5ML PO SUSP
30.0000 mL | Freq: Once | ORAL | Status: AC
  Administered 2019-01-11: 30 mL via ORAL

## 2019-01-11 MED ORDER — ACETAMINOPHEN 325 MG PO TABS
975.0000 mg | ORAL_TABLET | Freq: Once | ORAL | Status: AC
  Administered 2019-01-11: 18:00:00 975 mg via ORAL

## 2019-01-11 MED ORDER — ALUM & MAG HYDROXIDE-SIMETH 200-200-20 MG/5ML PO SUSP
ORAL | Status: AC
  Filled 2019-01-11: qty 30

## 2019-01-11 NOTE — ED Triage Notes (Signed)
Pt present dizziness, weakness and frequency of urination. Pt states while he is urinating it burns.

## 2019-01-11 NOTE — ED Provider Notes (Signed)
[place]    CSN: [id number] Arrival date & time: 01/11/19  1716      History   Chief Complaint Chief Complaint  Patient presents with  . Dizziness  . Urinary Frequency  . Fatigue    HPI Hector [name] is a 33 y.o. adult.   Hector [name] is a male to male transgender patient who presents with complaints of burning and frequency with urination as well as headache, and RUQ abdominal pain, weakness, decreased appetite. Started approximately two days ago. Has had limited intake due to his symptoms. No nausea or vomiting. Eating makes his stomach feel worse. No fevers. No blood in urine. No low abdomen or pelvic pain. No penile discharge. Sexually active with male partners, uses condoms. No specific known exposure at this time to stds. No back pain. Denies etoh use. Denies any previous similar. History  Of htn, MI, HIV, depression, neurosyphilis.     ROS per HPI, negative if not otherwise mentioned.      Past Medical History:  Diagnosis Date  . Depression   . Headache   . History of fractured vertebra april 8th Diagnosed   [place] out of a tree while climbing it.  . Human immunodeficiency virus I infection ([place]) 2016  . Hypertension   . Mental disorder   . Myocardial infarction [facility])     Patient Active Problem List   Diagnosis Date Noted  . Depression 06/14/2018  . Transgender 06/14/2018  . Febrile illness 09/16/2016  . Hx of neurosyphilis 09/16/2016  . Essential hypertension 09/16/2016  . Human immunodeficiency virus I infection ([place]) 07/20/2016  . Hepatitis B immune 07/18/2016  . Non-compliance with treatment 10/06/2011  . Insomnia related to another mental disorder 10/04/2011  . Schizoaffective disorder, bipolar type ([place]) 10/03/2011    Class: Acute    Past Surgical History:  Procedure Laterality Date  . NO PAST SURGERIES         Home Medications    Prior to Admission medications   Medication Sig Start Date End Date Taking? Authorizing  Provider  bictegravir-emtricitabine-tenofovir AF (BIKTARVY) 50-200-25 MG TABS tablet Take 1 tablet by mouth daily. 06/14/18   Campbell Riches, MD  cephALEXin (KEFLEX) 500 MG capsule Take 1 capsule (500 mg total) by mouth 3 (three) times daily for 7 days. 01/11/19 01/18/19  Zigmund Gottron, NP  estradiol (ESTRACE) 1 MG tablet Take 1 tablet (1 mg total) by mouth daily. 12/18/18   Renato Shin, MD  omeprazole (PRILOSEC) 20 MG capsule Take 1 capsule (20 mg total) by mouth daily. 01/11/19   Zigmund Gottron, NP    Family History Family History  Problem Relation Age of Onset  . CAD Mother 58       MI  . Endocrine Disorder Neg Hx     Social History Social History   Tobacco Use  . Smoking status: Never Smoker  . Smokeless tobacco: Never Used  Substance Use Topics  . Alcohol use: No  . Drug use: No     Allergies   Tomato, Risperdal [risperidone], and Aspirin   Review of Systems Review of Systems   Physical Exam Triage Vital Signs ED Triage Vitals  Enc Vitals Group     BP 01/11/19 1726 123/73     Pulse Rate 01/11/19 1726 (!) 107     Resp 01/11/19 1726 17     Temp 01/11/19 1726 98 F (36.7 C)     Temp Source 01/11/19 1726 Tympanic     SpO2 01/11/19  1726 98 %     Weight --      Height --      Head Circumference --      Peak Flow --      Pain Score 01/11/19 1736 8     Pain Loc --      Pain Edu? --      Excl. in GC? --    No data found.  Updated Vital Signs BP 123/73 (BP Location: Left Arm)   Pulse (!) 107   Temp 98 F (36.7 C) (Tympanic)   Resp 17   SpO2 98%    Physical Exam Constitutional:      General: She is not in acute distress.    Appearance: She is well-developed.  Cardiovascular:     Rate and Rhythm: Tachycardia present.  Pulmonary:     Effort: Pulmonary effort is normal.  Abdominal:     Palpations: Abdomen is soft.     Tenderness: There is abdominal tenderness in the right upper quadrant and epigastric area. There is no right CVA tenderness,  left CVA tenderness, guarding or rebound. Negative signs include Murphy's sign.  Genitourinary:    Penis: Circumcised. No discharge, swelling or lesions.      Comments: Penile swab obtained  Skin:    General: Skin is warm and dry.  Neurological:     Mental Status: She is alert and oriented to person, place, and time.      UC Treatments / Results  Labs (all labs ordered are listed, but only abnormal results are displayed) Labs Reviewed  GLUCOSE, CAPILLARY - Abnormal; Notable for the following components:      Result Value   Glucose-Capillary 113 (*)    All other components within normal limits  POCT URINALYSIS DIP (DEVICE) - Abnormal; Notable for the following components:   Hgb urine dipstick MODERATE (*)    Protein, ur 30 (*)    Urobilinogen, UA 2.0 (*)    Leukocytes,Ua TRACE (*)    All other components within normal limits  URINE CULTURE  COMPREHENSIVE METABOLIC PANEL  CBC WITH DIFFERENTIAL/PLATELET  LIPASE, BLOOD  CBG MONITORING, ED  CYTOLOGY, (ORAL, ANAL, URETHRAL) ANCILLARY ONLY    EKG   Radiology No results found.  Procedures Procedures (including critical care time)  Medications Ordered in UC Medications  acetaminophen (TYLENOL) tablet 975 mg (975 mg Oral Given 01/11/19 1820)  alum & mag hydroxide-simeth (MAALOX/MYLANTA) 200-200-20 MG/5ML suspension 30 mL (30 mLs Oral Given 01/11/19 1829)    And  lidocaine (XYLOCAINE) 2 % viscous mouth solution 15 mL (15 mLs Oral Given 01/11/19 1829)  acetaminophen (TYLENOL) 325 MG tablet (has no administration in time range)  alum & mag hydroxide-simeth (MAALOX/MYLANTA) 200-200-20 MG/5ML suspension (has no administration in time range)  lidocaine (XYLOCAINE) 2 % viscous mouth solution (has no administration in time range)    Initial Impression / Assessment and Plan / UC Course  I have reviewed the triage vital signs and the nursing notes.  Pertinent labs & imaging results that were available during my care of the patient  were reviewed by me and considered in my medical decision making (see chart for details).     hgb and leuks to urine with burning and frequency, will start treatment for UTI with cytology pending. RUQ pain and epigastric pain on exam. Gi cocktail provided, encouraged start omeprazole. Labs including lipase pending. Encouraged to increase fluid intake as able. Strict ER precautions. Will notify of any positive findings and if any changes to  treatment are needed.  Patient verbalized understanding and agreeable to plan.   Final Clinical Impressions(s) / UC Diagnoses   Final diagnoses:  Lower urinary tract infectious disease     Discharge Instructions     Please start antibiotics for concern for UTI.  I would also recommend daily omeprazole to help with your reflux symptoms.  Testing for STD's pending as well. Will notify of any positive findings and if any changes to treatment are needed.   Drink plenty of water to empty bladder regularly. Increase your fluid intake and empty your bladder regularly.  I am checking labs to evaluate your gallbladder and liver. Will notify you of any positive findings and if any changes to treatment are needed.   I do recommend a low fat diet.  If any worsening of symptoms, fevers, dizziness, or weakness, further dehydration please go to the ER.     ED Prescriptions    Medication Sig Dispense Auth. Provider   omeprazole (PRILOSEC) 20 MG capsule Take 1 capsule (20 mg total) by mouth daily. 30 capsule Linus MakoBurky, Natalie B, NP   cephALEXin (KEFLEX) 500 MG capsule Take 1 capsule (500 mg total) by mouth 3 (three) times daily for 7 days. 21 capsule Georgetta HaberBurky, Natalie B, NP     Controlled Substance Prescriptions [place] Controlled Substance Registry consulted? Not Applicable   Georgetta HaberBurky, Natalie B, NP 01/11/19 1943

## 2019-01-11 NOTE — Discharge Instructions (Addendum)
Please start antibiotics for concern for UTI.  I would also recommend daily omeprazole to help with your reflux symptoms.  Testing for STD's pending as well. Will notify of any positive findings and if any changes to treatment are needed.   Drink plenty of water to empty bladder regularly. Increase your fluid intake and empty your bladder regularly.  I am checking labs to evaluate your gallbladder and liver. Will notify you of any positive findings and if any changes to treatment are needed.   I do recommend a low fat diet.  If any worsening of symptoms, fevers, dizziness, or weakness, further dehydration please go to the ER.

## 2019-01-12 ENCOUNTER — Other Ambulatory Visit: Payer: Self-pay

## 2019-01-12 ENCOUNTER — Inpatient Hospital Stay (HOSPITAL_COMMUNITY)
Admission: EM | Admit: 2019-01-12 | Discharge: 2019-01-13 | DRG: 864 | Discharge status: Against Medical Advice | Payer: Medicare Other | Attending: Internal Medicine | Admitting: Internal Medicine

## 2019-01-12 ENCOUNTER — Encounter (HOSPITAL_COMMUNITY): Payer: Self-pay

## 2019-01-12 ENCOUNTER — Emergency Department (HOSPITAL_COMMUNITY): Payer: Medicare Other

## 2019-01-12 DIAGNOSIS — R109 Unspecified abdominal pain: Secondary | ICD-10-CM | POA: Diagnosis present

## 2019-01-12 DIAGNOSIS — F640 Transsexualism: Secondary | ICD-10-CM | POA: Diagnosis present

## 2019-01-12 DIAGNOSIS — Z888 Allergy status to other drugs, medicaments and biological substances status: Secondary | ICD-10-CM | POA: Diagnosis not present

## 2019-01-12 DIAGNOSIS — Z20828 Contact with and (suspected) exposure to other viral communicable diseases: Secondary | ICD-10-CM | POA: Diagnosis present

## 2019-01-12 DIAGNOSIS — K529 Noninfective gastroenteritis and colitis, unspecified: Secondary | ICD-10-CM | POA: Diagnosis not present

## 2019-01-12 DIAGNOSIS — K625 Hemorrhage of anus and rectum: Secondary | ICD-10-CM | POA: Diagnosis present

## 2019-01-12 DIAGNOSIS — Z91018 Allergy to other foods: Secondary | ICD-10-CM | POA: Diagnosis not present

## 2019-01-12 DIAGNOSIS — I252 Old myocardial infarction: Secondary | ICD-10-CM

## 2019-01-12 DIAGNOSIS — Z21 Asymptomatic human immunodeficiency virus [HIV] infection status: Secondary | ICD-10-CM | POA: Diagnosis present

## 2019-01-12 DIAGNOSIS — Z79890 Hormone replacement therapy (postmenopausal): Secondary | ICD-10-CM

## 2019-01-12 DIAGNOSIS — Z8249 Family history of ischemic heart disease and other diseases of the circulatory system: Secondary | ICD-10-CM

## 2019-01-12 DIAGNOSIS — Z87892 Personal history of anaphylaxis: Secondary | ICD-10-CM | POA: Diagnosis not present

## 2019-01-12 DIAGNOSIS — Z79899 Other long term (current) drug therapy: Secondary | ICD-10-CM

## 2019-01-12 DIAGNOSIS — I10 Essential (primary) hypertension: Secondary | ICD-10-CM | POA: Diagnosis present

## 2019-01-12 DIAGNOSIS — Z886 Allergy status to analgesic agent status: Secondary | ICD-10-CM

## 2019-01-12 DIAGNOSIS — R509 Fever, unspecified: Principal | ICD-10-CM | POA: Diagnosis present

## 2019-01-12 DIAGNOSIS — F250 Schizoaffective disorder, bipolar type: Secondary | ICD-10-CM | POA: Diagnosis present

## 2019-01-12 LAB — URINALYSIS, ROUTINE W REFLEX MICROSCOPIC
Bilirubin Urine: NEGATIVE
Glucose, UA: NEGATIVE mg/dL
Ketones, ur: NEGATIVE mg/dL
Nitrite: NEGATIVE
Protein, ur: 100 mg/dL — AB
Specific Gravity, Urine: 1.044 — ABNORMAL HIGH (ref 1.005–1.030)
pH: 6 (ref 5.0–8.0)

## 2019-01-12 LAB — PROTIME-INR
INR: 1.2 (ref 0.8–1.2)
Prothrombin Time: 15.1 seconds (ref 11.4–15.2)

## 2019-01-12 LAB — CBC
HCT: 39 % (ref 39.0–52.0)
Hemoglobin: 13.6 g/dL (ref 13.0–17.0)
MCH: 29.8 pg (ref 26.0–34.0)
MCHC: 34.9 g/dL (ref 30.0–36.0)
MCV: 85.3 fL (ref 80.0–100.0)
Platelets: 266 10*3/uL (ref 150–400)
RBC: 4.57 MIL/uL (ref 4.22–5.81)
RDW: 12 % (ref 11.5–15.5)
WBC: 18.4 10*3/uL — ABNORMAL HIGH (ref 4.0–10.5)
nRBC: 0 % (ref 0.0–0.2)

## 2019-01-12 LAB — LIPASE, BLOOD: Lipase: 30 U/L (ref 11–51)

## 2019-01-12 LAB — COMPREHENSIVE METABOLIC PANEL
ALT: 28 U/L (ref 0–44)
AST: 20 U/L (ref 15–41)
Albumin: 3.8 g/dL (ref 3.5–5.0)
Alkaline Phosphatase: 77 U/L (ref 38–126)
Anion gap: 12 (ref 5–15)
BUN: 7 mg/dL (ref 6–20)
CO2: 23 mmol/L (ref 22–32)
Calcium: 9.2 mg/dL (ref 8.9–10.3)
Chloride: 99 mmol/L (ref 98–111)
Creatinine, Ser: 0.94 mg/dL (ref 0.61–1.24)
GFR calc Af Amer: >60 mL/min (ref >60)
GFR calc non Af Amer: >60 mL/min (ref >60)
Glucose, Bld: 111 mg/dL — ABNORMAL HIGH (ref 70–99)
Potassium: 3.5 mmol/L (ref 3.5–5.1)
Sodium: 134 mmol/L — ABNORMAL LOW (ref 135–145)
Total Bilirubin: 1.1 mg/dL (ref 0.3–1.2)
Total Protein: 9.2 g/dL — ABNORMAL HIGH (ref 6.5–8.1)

## 2019-01-12 LAB — POC OCCULT BLOOD, ED: Fecal Occult Bld: POSITIVE — AB

## 2019-01-12 MED ORDER — SODIUM CHLORIDE 0.9 % IV BOLUS
1000.0000 mL | Freq: Once | INTRAVENOUS | Status: AC
  Administered 2019-01-12: 1000 mL via INTRAVENOUS

## 2019-01-12 MED ORDER — METRONIDAZOLE IN NACL 5-0.79 MG/ML-% IV SOLN
500.0000 mg | Freq: Once | INTRAVENOUS | Status: AC
  Administered 2019-01-12: 500 mg via INTRAVENOUS
  Filled 2019-01-12: qty 100

## 2019-01-12 MED ORDER — BICTEGRAVIR-EMTRICITAB-TENOFOV 50-200-25 MG PO TABS: 1.0000 | ORAL_TABLET | Freq: Every day | ORAL | Status: DC

## 2019-01-12 MED ORDER — IOHEXOL 300 MG/ML  SOLN
100.0000 mL | Freq: Once | INTRAMUSCULAR | Status: AC | PRN
  Administered 2019-01-12: 100 mL via INTRAVENOUS

## 2019-01-12 MED ORDER — CIPROFLOXACIN IN D5W 400 MG/200ML IV SOLN
400.0000 mg | Freq: Two times a day (BID) | INTRAVENOUS | Status: DC
  Filled 2019-01-12: qty 200

## 2019-01-12 MED ORDER — CIPROFLOXACIN IN D5W 400 MG/200ML IV SOLN
400.0000 mg | Freq: Once | INTRAVENOUS | Status: AC
  Administered 2019-01-13: 400 mg via INTRAVENOUS
  Filled 2019-01-12: qty 200

## 2019-01-12 MED ORDER — METRONIDAZOLE IN NACL 5-0.79 MG/ML-% IV SOLN: 500.0000 mg | Freq: Three times a day (TID) | INTRAVENOUS | Status: DC

## 2019-01-12 MED ORDER — ACETAMINOPHEN 500 MG PO TABS
1000.0000 mg | ORAL_TABLET | Freq: Once | ORAL | Status: AC
  Administered 2019-01-12: 1000 mg via ORAL
  Filled 2019-01-12: qty 2

## 2019-01-12 NOTE — H&P (Signed)
History and Physical  Ramon DredgeShakei McRae ZOX:[id number] DOB: 07/09/1985 DOA: 01/12/2019  Referring physician: ER provider PCP: Ginnie SmartHatcher, Jeffrey C, MD  Outpatient Specialists: Infectious disease team Patient coming from: Home  Chief Complaint: Fever and abdominal pain  HPI:  Patient is a 33 year old African-American male in the process of becoming a male, with past medical history significant for HIV with last CD4 count noted to be 650, hypertension and myocardial infarction.  Patient presents with 2-day history of fever, dysuria, urinary frequency, abdominal pain, rectal bleed that is mainly associated with defecation without any constipation, nausea or vomiting.  No associated diarrhea.  Abdominal pain is intermittent, with no particular character according to the patient, moves from central abdominal area to the right lower abdominal quadrant area and to the inguinal area.  Patient was seen yesterday at an ER and was started on Keflex due to significant urinary symptoms and significant leukocytosis (WBC of 20,500).  Patient has continued to have fever, with documented fever 102.9 F on presentation.  WBC today is 18,400.  UA done yesterday was reviewed.  CT scan of the abdomen and pelvis done on presentation to the ER today was suggestive of possible cystitis, prostatitis, query urethritis or epididymitis.  No headache, no neck pain, no chest pain, no constipation and no joint pains.   ED Course: On presentation to the ER, T-max of 102.9 F was noted, heart rate of 73 to 114 bpm, respiratory rate of 12, blood pressure of 139/70 mmHg.  Pertinent labs revealed WBC of 18.4, sodium of 134, blood sugar of 111.  Pertinent labs: As above.  Imaging: independently reviewed.   Review of Systems:  Negative for visual changes, sore throat, rash, new muscle aches, chest pain, SOB.  Past Medical History:  Diagnosis Date  . Depression   . Headache   . History of fractured vertebra april 8th Diagnosed   [place] SeatFell  out of a tree while climbing it.  . Human immunodeficiency virus I infection (HCC) 2016  . Hypertension   . Mental disorder   . Myocardial infarction [facility](HCC)     Past Surgical History:  Procedure Laterality Date  . NO PAST SURGERIES       reports that she has never smoked. She has never used smokeless tobacco. She reports that she does not drink alcohol or use drugs.  Allergies  Allergen Reactions  . Tomato Anaphylaxis and Rash  . Risperdal [Risperidone] Nausea And Vomiting  . Aspirin Itching    Family History  Problem Relation Age of Onset  . CAD Mother 1956       MI  . Endocrine Disorder Neg Hx      Prior to Admission medications   Medication Sig Start Date End Date Taking? Authorizing Provider  bictegravir-emtricitabine-tenofovir AF (BIKTARVY) 50-200-25 MG TABS tablet Take 1 tablet by mouth daily. 06/14/18   Ginnie SmartHatcher, Jeffrey C, MD  cephALEXin (KEFLEX) 500 MG capsule Take 1 capsule (500 mg total) by mouth 3 (three) times daily for 7 days. 01/11/19 01/18/19  Georgetta HaberBurky, Natalie B, NP  estradiol (ESTRACE) 1 MG tablet Take 1 tablet (1 mg total) by mouth daily. 12/18/18   Romero BellingEllison, Sean, MD  omeprazole (PRILOSEC) 20 MG capsule Take 1 capsule (20 mg total) by mouth daily. 01/11/19   Georgetta HaberBurky, Natalie B, NP    Physical Exam: Vitals:   01/12/19 2115 01/12/19 2130 01/12/19 2145 01/12/19 2200  BP: 128/73 136/84 136/79 130/64  Pulse: 78 90 83 73  Resp: 16 12 15 12   Temp:  TempSrc:      SpO2: 98% 99% 97% 99%  Weight:      Height:        Constitutional:  . Appears calm and comfortable Eyes:  . No pallor. No jaundice.  ENMT:  . external ears, nose appear normal Neck:  . Neck is supple. No JVD Respiratory:  . CTA bilaterally, no w/r/r.  . Respiratory effort normal. No retractions or accessory muscle use Cardiovascular:  . S1S2 . No LE extremity edema   Abdomen:  . Abdomen is obese, soft with no objective tenderness elicited.  Organs are difficult to assess. Neurologic:  .  Awake and alert. . Moves all limbs.  Wt Readings from Last 3 Encounters:  01/12/19 97.5 kg  12/18/18 108.9 kg  08/15/18 124.3 kg    I have personally reviewed following labs and imaging studies  Labs on Admission:  CBC: Recent Labs  Lab 01/11/19 1925 01/12/19 1716  WBC 20.5* 18.4*  NEUTROABS 16.6*  --   HGB 13.4 13.6  HCT 38.5* 39.0  MCV 85.4 85.3  PLT 257 266   Basic Metabolic Panel: Recent Labs  Lab 01/11/19 1925 01/12/19 1716  NA 134* 134*  K 3.2* 3.5  CL 102 99  CO2 24 23  GLUCOSE 101* 111*  BUN 11 7  CREATININE 0.98 0.94  CALCIUM 8.9 9.2   Liver Function Tests: Recent Labs  Lab 01/11/19 1925 01/12/19 1716  AST 17 20  ALT 22 28  ALKPHOS 70 77  BILITOT 1.0 1.1  PROT 8.4* 9.2*  ALBUMIN 3.7 3.8   Recent Labs  Lab 01/11/19 1925 01/12/19 1833  LIPASE 31 30   No results for input(s): AMMONIA in the last 168 hours. Coagulation Profile: Recent Labs  Lab 01/12/19 1716  INR 1.2   Cardiac Enzymes: No results for input(s): CKTOTAL, CKMB, CKMBINDEX, TROPONINI in the last 168 hours. BNP (last 3 results) No results for input(s): PROBNP in the last 8760 hours. HbA1C: No results for input(s): HGBA1C in the last 72 hours. CBG: Recent Labs  Lab 01/11/19 1818  GLUCAP 113*   Lipid Profile: No results for input(s): CHOL, HDL, LDLCALC, TRIG, CHOLHDL, LDLDIRECT in the last 72 hours. Thyroid Function Tests: No results for input(s): TSH, T4TOTAL, FREET4, T3FREE, THYROIDAB in the last 72 hours. Anemia Panel: No results for input(s): VITAMINB12, FOLATE, FERRITIN, TIBC, IRON, RETICCTPCT in the last 72 hours. Urine analysis:    Component Value Date/Time   COLORURINE YELLOW 03/13/2017 1919   APPEARANCEUR CLEAR 03/13/2017 1919   LABSPEC 1.020 01/11/2019 1755   PHURINE 6.5 01/11/2019 1755   GLUCOSEU NEGATIVE 01/11/2019 1755   HGBUR MODERATE (A) 01/11/2019 1755   BILIRUBINUR NEGATIVE 01/11/2019 1755   KETONESUR NEGATIVE 01/11/2019 1755   PROTEINUR 30 (A)  01/11/2019 1755   UROBILINOGEN 2.0 (H) 01/11/2019 1755   NITRITE NEGATIVE 01/11/2019 1755   LEUKOCYTESUR TRACE (A) 01/11/2019 1755   Sepsis Labs: @LABRCNTIP (procalcitonin:4,lacticidven:4) )No results found for this or any previous visit (from the past 240 hour(s)).    Radiological Exams on Admission: Ct Abdomen Pelvis W Contrast  Result Date: 01/12/2019 CLINICAL DATA:  Abdominal pain.  Rectal bleeding and headache. EXAM: CT ABDOMEN AND PELVIS WITH CONTRAST TECHNIQUE: Multidetector CT imaging of the abdomen and pelvis was performed using the standard protocol following bolus administration of intravenous contrast. CONTRAST:  100mL OMNIPAQUE IOHEXOL 300 MG/ML  SOLN COMPARISON:  CT dated 09/16/2016 FINDINGS: Lower chest: The lung bases are clear. The heart size is normal. Hepatobiliary: The liver is normal.  Normal gallbladder.There is no biliary ductal dilation. Pancreas: Normal contours without ductal dilatation. No peripancreatic fluid collection. Spleen: No splenic laceration or hematoma. Adrenals/Urinary Tract: --Adrenal glands: No adrenal hemorrhage. --Right kidney/ureter: No hydronephrosis or perinephric hematoma. --Left kidney/ureter: No hydronephrosis or perinephric hematoma. --Urinary bladder: There are inflammatory changes surrounding the urinary bladder with a trace amount of free fluid in the patient's pelvis. Stomach/Bowel: --Stomach/Duodenum: No hiatal hernia or other gastric abnormality. Normal duodenal course and caliber. --Small bowel: No dilatation or inflammation. --Colon: No focal abnormality. --Appendix: Normal. Vascular/Lymphatic: Normal course and caliber of the major abdominal vessels. --there are some mildly enlarged but subcentimeter retroperitoneal lymph nodes. --No mesenteric lymphadenopathy. --there are multiple enlarged pelvic lymph nodes and bilateral inguinal lymph nodes. For example there is a left pelvic sidewall lymph node measuring approximately 1 cm in the short axis.  This however is stable from prior CT in 2018. Reproductive: The prostate gland is mildly enlarged. The seminal vesicles appeared large. There is some prominence of the right inguinal canal vasculature. Other: There is no free air. There is a trace amount of free fluid in the patient's pelvis. The abdominal wall is normal. Musculoskeletal. No acute displaced fractures. IMPRESSION: Inflammatory process in the pelvis as detailed above. Differential considerations include a cystitis, prostatitis, or other infectious inflammatory process such as urethritis or epididymitis. There is some enlargement of the seminal vesicles which appears stable across prior studies. Seminal vesiculitis is a consideration but is relatively rare. There are multiple enlarged pelvic and inguinal lymph nodes which are presumably reactive and are stable when compared to CT in 2018. The prostate gland is mildly enlarged. Electronically Signed   By: Constance Holster M.D.   On: 01/12/2019 19:51    Active Problems:   Fever   Assessment/Plan Fever: Continue to work patient up. Possible secondary to prostatitis/cystitis. Follow urine culture IV antibiotics Further management depend on hospital course.  Abdominal pain: Etiology unclear CT scan abdomen/pelvis does not clearly explain cause of abdominal pain. Continue to assess.  HIV: Patient follows up with the infectious disease team. Continue current medication Low threshold to consult infectious disease team.  Rectal bleed: Suspect hemorrhoidal bleed Avoid constipation Continue to assess Monitor H/H Further management depend on hospital course  Prostatitis versus cystitis: Follow culture Continue IV antibiotics  DVT prophylaxis: SCD Code Status: Full code Family Communication:  Disposition Plan: Home eventually Consults called: None Admission status: Inpatient  Time spent: 65 minutes  Dana Allan, MD  [facility] Pager #: [phone]  7PM-7AM contact night coverage as above  01/12/2019, 10:54 PM

## 2019-01-12 NOTE — ED Notes (Signed)
The patient refused the coronavirus swab saying that he already had one done and is not doing it again.

## 2019-01-12 NOTE — ED Triage Notes (Signed)
Pt presents with rectal bleeding, headache, weakness, hot and cold flashes, dizziness and no appetite  x2 days. Seen at [facility] UC yesterday for the same. [place], he is to get results Monday.

## 2019-01-12 NOTE — ED Provider Notes (Signed)
[facility] Provider Note   CSN: [id number] Arrival date & time: 01/12/19  1709     History   Chief Complaint Chief Complaint  Patient presents with   Rectal Bleeding    HPI Hector [name] is a 33 y.o. adult.     HPI  33 year old male with a history of transgender rhythm, HIV, hypertension as well as a history of schizoaffective bipolar type.  He presents to the hospital today reporting to me that he has been having blood in his stools for approximately 1 week.  He reports that he has having some right-sided abdominal discomfort which seems to get worse when he eats and is associated with nausea.  He has been drinking fluids but has not had anything to eat in 2 days.  He also reports a headache but denies any shortness of breath coughing or chest pain.  He had been at the hospital 1 week ago in [place] where he states he was tested for COVID.  He was not having the bleeding at that time.  The cover test is not returned yet.  He did develop a fever today and is 102.9 degrees on arrival.  He has complained of no dysuria though he was told that he might have a kidney stone, I have reviewed the medical record which reveals that he was actually seen at the urgent care yesterday, diagnosed with a possible UTI and started on cephalexin.  He did have a white blood cell count of 20,000 but no elevation in liver function tests or lipase at that time.  He was also started on antacid medications.  He denies any prior abdominal surgical history   Past Medical History:  Diagnosis Date   Depression    Headache    History of fractured vertebra april 8th Diagnosed   Larey SeatFell out of a tree while climbing it.   Human immunodeficiency virus I infection (HCC) 2016   Hypertension    Mental disorder    Myocardial infarction [facility](HCC)     Patient Active Problem List   Diagnosis Date Noted   Depression 06/14/2018   Transgender 06/14/2018   Febrile illness  09/16/2016   Hx of neurosyphilis 09/16/2016   Essential hypertension 09/16/2016   Human immunodeficiency virus I infection (HCC) 07/20/2016   Hepatitis B immune 07/18/2016   Non-compliance with treatment 10/06/2011   Insomnia related to another mental disorder 10/04/2011   Schizoaffective disorder, bipolar type (HCC) 10/03/2011    Class: Acute    Past Surgical History:  Procedure Laterality Date   NO PAST SURGERIES          Home Medications    Prior to Admission medications   Medication Sig Start Date End Date Taking? Authorizing Provider  bictegravir-emtricitabine-tenofovir AF (BIKTARVY) 50-200-25 MG TABS tablet Take 1 tablet by mouth daily. 06/14/18   Ginnie SmartHatcher, Jeffrey C, MD  cephALEXin (KEFLEX) 500 MG capsule Take 1 capsule (500 mg total) by mouth 3 (three) times daily for 7 days. 01/11/19 01/18/19  Georgetta HaberBurky, Natalie B, NP  estradiol (ESTRACE) 1 MG tablet Take 1 tablet (1 mg total) by mouth daily. 12/18/18   Romero BellingEllison, Sean, MD  omeprazole (PRILOSEC) 20 MG capsule Take 1 capsule (20 mg total) by mouth daily. 01/11/19   Georgetta HaberBurky, Natalie B, NP    Family History Family History  Problem Relation Age of Onset   CAD Mother 5856       MI   Endocrine Disorder Neg Hx     Social History  Social History   Tobacco Use   Smoking status: Never Smoker   Smokeless tobacco: Never Used  Substance Use Topics   Alcohol use: No   Drug use: No     Allergies   Tomato, Risperdal [risperidone], and Aspirin   Review of Systems Review of Systems  All other systems reviewed and are negative.    Physical Exam Updated Vital Signs BP 130/64    Pulse 73    Temp (!) 101 F (38.3 C) (Oral)    Resp 12    Ht 1.88 m (6\' 2" )    Wt 97.5 kg    SpO2 99%    BMI 27.60 kg/m   Physical Exam Vitals signs and nursing note reviewed.  Constitutional:      General: She is not in acute distress.    Appearance: She is well-developed.  HENT:     Head: Normocephalic and atraumatic.      Mouth/Throat:     Pharynx: No oropharyngeal exudate.  Eyes:     General: No scleral icterus.       Right eye: No discharge.        Left eye: No discharge.     Conjunctiva/sclera: Conjunctivae normal.     Pupils: Pupils are equal, round, and reactive to light.  Neck:     Musculoskeletal: Normal range of motion and neck supple.     Thyroid: No thyromegaly.     Vascular: No JVD.  Cardiovascular:     Rate and Rhythm: Normal rate and regular rhythm.     Heart sounds: Normal heart sounds. No murmur. No friction rub. No gallop.   Pulmonary:     Effort: Pulmonary effort is normal. No respiratory distress.     Breath sounds: Normal breath sounds. No wheezing or rales.  Abdominal:     General: Bowel sounds are normal. There is no distension.     Palpations: Abdomen is soft. There is no mass.     Tenderness: There is abdominal tenderness.     Comments: The patient has mild tenderness to the upper and right upper abdomen, mild tenderness in the right lower abdomen as well, no left-sided tenderness.  Musculoskeletal: Normal range of motion.        General: No tenderness.  Lymphadenopathy:     Cervical: No cervical adenopathy.  Skin:    General: Skin is warm and dry.     Findings: No erythema or rash.  Neurological:     Mental Status: She is alert.     Coordination: Coordination normal.  Psychiatric:        Behavior: Behavior normal.      ED Treatments / Results  Labs (all labs ordered are listed, but only abnormal results are displayed) Labs Reviewed  COMPREHENSIVE METABOLIC PANEL - Abnormal; Notable for the following components:      Result Value   Sodium 134 (*)    Glucose, Bld 111 (*)    Total Protein 9.2 (*)    All other components within normal limits  CBC - Abnormal; Notable for the following components:   WBC 18.4 (*)    All other components within normal limits  POC OCCULT BLOOD, ED - Abnormal; Notable for the following components:   Fecal Occult Bld POSITIVE (*)     All other components within normal limits  NOVEL CORONAVIRUS, NAA (HOSPITAL ORDER, SEND-OUT TO REF LAB)  PROTIME-INR  LIPASE, BLOOD  URINALYSIS, ROUTINE W REFLEX MICROSCOPIC    EKG None  Radiology Ct Abdomen  Pelvis W Contrast  Result Date: 01/12/2019 CLINICAL DATA:  Abdominal pain.  Rectal bleeding and headache. EXAM: CT ABDOMEN AND PELVIS WITH CONTRAST TECHNIQUE: Multidetector CT imaging of the abdomen and pelvis was performed using the standard protocol following bolus administration of intravenous contrast. CONTRAST:  100mL OMNIPAQUE IOHEXOL 300 MG/ML  SOLN COMPARISON:  CT dated 09/16/2016 FINDINGS: Lower chest: The lung bases are clear. The heart size is normal. Hepatobiliary: The liver is normal. Normal gallbladder.There is no biliary ductal dilation. Pancreas: Normal contours without ductal dilatation. No peripancreatic fluid collection. Spleen: No splenic laceration or hematoma. Adrenals/Urinary Tract: --Adrenal glands: No adrenal hemorrhage. --Right kidney/ureter: No hydronephrosis or perinephric hematoma. --Left kidney/ureter: No hydronephrosis or perinephric hematoma. --Urinary bladder: There are inflammatory changes surrounding the urinary bladder with a trace amount of free fluid in the patient's pelvis. Stomach/Bowel: --Stomach/Duodenum: No hiatal hernia or other gastric abnormality. Normal duodenal course and caliber. --Small bowel: No dilatation or inflammation. --Colon: No focal abnormality. --Appendix: Normal. Vascular/Lymphatic: Normal course and caliber of the major abdominal vessels. --there are some mildly enlarged but subcentimeter retroperitoneal lymph nodes. --No mesenteric lymphadenopathy. --there are multiple enlarged pelvic lymph nodes and bilateral inguinal lymph nodes. For example there is a left pelvic sidewall lymph node measuring approximately 1 cm in the short axis. This however is stable from prior CT in 2018. Reproductive: The prostate gland is mildly enlarged. The  seminal vesicles appeared large. There is some prominence of the right inguinal canal vasculature. Other: There is no free air. There is a trace amount of free fluid in the patient's pelvis. The abdominal wall is normal. Musculoskeletal. No acute displaced fractures. IMPRESSION: Inflammatory process in the pelvis as detailed above. Differential considerations include a cystitis, prostatitis, or other infectious inflammatory process such as urethritis or epididymitis. There is some enlargement of the seminal vesicles which appears stable across prior studies. Seminal vesiculitis is a consideration but is relatively rare. There are multiple enlarged pelvic and inguinal lymph nodes which are presumably reactive and are stable when compared to CT in 2018. The prostate gland is mildly enlarged. Electronically Signed   By: Katherine Mantlehristopher  Green M.D.   On: 01/12/2019 19:51    Procedures Procedures (including critical care time)  Medications Ordered in ED Medications  ciprofloxacin (CIPRO) IVPB 400 mg (has no administration in time range)  metroNIDAZOLE (FLAGYL) IVPB 500 mg (has no administration in time range)  sodium chloride 0.9 % bolus 1,000 mL (has no administration in time range)  acetaminophen (TYLENOL) tablet 1,000 mg (1,000 mg Oral Given 01/12/19 1921)  sodium chloride 0.9 % bolus 1,000 mL (1,000 mLs Intravenous New Bag/Given 01/12/19 1923)  iohexol (OMNIPAQUE) 300 MG/ML solution 100 mL (100 mLs Intravenous Contrast Given 01/12/19 1929)     Initial Impression / Assessment and Plan / ED Course  I have reviewed the triage vital signs and the nursing notes.  Pertinent labs & imaging results that were available during my care of the patient were reviewed by me and considered in my medical decision making (see chart for details).       Overall the patient has reported that he may have had a kidney stone on a prior CAT scan but at this time is febrile, his urinalysis yesterday was underwhelming to  claim that all of this is from a urine infection.  His labs today show a leukocytosis of 18,400, he will need to have a CT scan to look for surgical causes of his symptoms, would consider cholecystitis, colitis, among a host  of other possibilities.  His fever is 102.9, given medications for this as an antipyretic.  IV fluids ordered as well as CT scan.  The patient is agreeable to the plan.  He reports he has not had receptive anal intercourse in 5 months, no other cause of the bleeding, he is not anticoagulated.  I have discussed the care with the hospitalist will admit the patient to the hospital.  I have given him Cipro and Flagyl to treat for intra-abdominal infection.  Patient will need to be admitted to the hospital due to his relative immunocompromise state  Final Clinical Impressions(s) / ED Diagnoses   Final diagnoses:  Colitis      Noemi Chapel, MD 01/12/19 2242

## 2019-01-13 LAB — HEMOGLOBIN: Hemoglobin: 12 g/dL — ABNORMAL LOW (ref 13.0–17.0)

## 2019-01-13 LAB — HEMATOCRIT: HCT: 36.4 % — ABNORMAL LOW (ref 39.0–52.0)

## 2019-01-13 LAB — PHOSPHORUS: Phosphorus: 2.3 mg/dL — ABNORMAL LOW (ref 2.5–4.6)

## 2019-01-13 LAB — MAGNESIUM: Magnesium: 1.8 mg/dL (ref 1.7–2.4)

## 2019-01-13 NOTE — ED Notes (Signed)
Pt states being comfortable having to lay in bed for a extended period of time. Pt offered reposition pt. Refused. Pt states desire to leave ED saying " I know my rights." RN informed pt we are unable to keep pts against their will. MD ordered this Antibiotic I would like to start at this time. Pt agreed to allow RN to start Cipro. Primary RN informed

## 2019-01-13 NOTE — ED Notes (Signed)
Advised patient that we cannot guarantee health, disability, etc if he leaves the premises. Advised pt he could deteriate and die; pt states "I've heard this before, but I know you got to do what you got to do".

## 2019-01-13 NOTE — ED Notes (Signed)
Pt insisted on leaving; pt aware that he is leaving AMA

## 2019-01-14 LAB — URINE CULTURE: Culture: <10000 — AB

## 2019-01-14 LAB — NOVEL CORONAVIRUS, NAA (HOSP ORDER, SEND-OUT TO REF LAB; TAT 18-24 HRS): SARS-CoV-2, NAA: NOT DETECTED

## 2019-01-15 LAB — CYTOLOGY, (ORAL, ANAL, URETHRAL) ANCILLARY ONLY
Chlamydia: NEGATIVE
Neisseria Gonorrhea: POSITIVE — AB
Trichomonas: NEGATIVE

## 2019-01-16 ENCOUNTER — Telehealth (HOSPITAL_COMMUNITY): Payer: Self-pay | Admitting: Emergency Medicine

## 2019-01-16 ENCOUNTER — Encounter (HOSPITAL_COMMUNITY): Payer: Self-pay

## 2019-01-16 NOTE — Telephone Encounter (Signed)
Patient contacted and made aware of   cytolog results, all questions answered. Pt states they will return to the clinic today for treatment.

## 2019-01-16 NOTE — Telephone Encounter (Signed)
Gonorrhea is positive.  Patient should return as soon as possible to the urgent care for treatment with IM rocephin 250mg  and po zithromax 1g. Patient will not need to see a provider unless there are new symptoms she would like evaluated. Pt needs education to refrain from sexual intercourse for now and for 7 days after treatment to give the medicine time to work. Sexual partners need to be notified and tested/treated. Condoms may reduce risk of reinfection. [facility] notified.   Attempted to reach patient. No answer at this time. No voicemail set up.

## 2019-01-18 ENCOUNTER — Ambulatory Visit (HOSPITAL_COMMUNITY)
Admission: EM | Admit: 2019-01-18 | Discharge: 2019-01-18 | Disposition: A | Discharge status: Home / Self Care | Payer: Medicare Other | Attending: Family Medicine | Admitting: Family Medicine

## 2019-01-18 ENCOUNTER — Telehealth (HOSPITAL_COMMUNITY): Payer: Self-pay | Admitting: Emergency Medicine

## 2019-01-18 ENCOUNTER — Other Ambulatory Visit: Payer: Self-pay

## 2019-01-18 DIAGNOSIS — A549 Gonococcal infection, unspecified: Secondary | ICD-10-CM | POA: Diagnosis not present

## 2019-01-18 MED ORDER — CEFTRIAXONE SODIUM 250 MG IJ SOLR
250.0000 mg | Freq: Once | INTRAMUSCULAR | Status: AC
  Administered 2019-01-18: 13:00:00 250 mg via INTRAMUSCULAR

## 2019-01-18 MED ORDER — AZITHROMYCIN 250 MG PO TABS
1000.0000 mg | ORAL_TABLET | Freq: Once | ORAL | Status: AC
  Administered 2019-01-18: 1000 mg via ORAL

## 2019-01-18 NOTE — ED Triage Notes (Signed)
Pt here for treatment of stds.

## 2019-01-18 NOTE — Telephone Encounter (Signed)
Pt called again, states he will return today for treatment.

## 2019-01-21 ENCOUNTER — Other Ambulatory Visit: Payer: Medicare Other

## 2019-01-21 ENCOUNTER — Encounter: Payer: Self-pay | Admitting: Endocrinology

## 2019-01-21 ENCOUNTER — Telehealth: Payer: Self-pay | Admitting: Endocrinology

## 2019-01-21 ENCOUNTER — Other Ambulatory Visit: Payer: Self-pay

## 2019-01-21 ENCOUNTER — Ambulatory Visit (INDEPENDENT_AMBULATORY_CARE_PROVIDER_SITE_OTHER): Payer: Medicare Other | Admitting: Endocrinology

## 2019-01-21 VITALS — BP 142/80 | HR 69 | Ht 74.0 in | Wt 278.2 lb

## 2019-01-21 DIAGNOSIS — I10 Essential (primary) hypertension: Secondary | ICD-10-CM | POA: Diagnosis not present

## 2019-01-21 DIAGNOSIS — F640 Transsexualism: Secondary | ICD-10-CM

## 2019-01-21 DIAGNOSIS — Z789 Other specified health status: Secondary | ICD-10-CM

## 2019-01-21 NOTE — Progress Notes (Signed)
Subjective:    Patient ID: Hector [name], adult    DOB: November 08, 1985, 33 y.o.   MRN: [id number]  HPI  Pt returns for f/u of transgender state (M to F):  Surgery: never Medication: E2 since 2020 Counseling: never Other: Pt says she went to [facility] to schedule orchiectomy, but they have delayed, due to coronavirus.   Interval hx: Pt says no progress on breast growth.  she is still considering the orchiectomy.  He takes E2 as rx'ed.    Past Medical History:  Diagnosis Date  . Depression   . Headache   . History of fractured vertebra april 8th Diagnosed   [place] out of a tree while climbing it.  . Human immunodeficiency virus I infection ([place]) 2016  . Hypertension   . Mental disorder   . Myocardial infarction [facility])     Past Surgical History:  Procedure Laterality Date  . NO PAST SURGERIES      Social History   Socioeconomic History  . Marital status: Single    Spouse name: Not on file  . Number of children: Not on file  . Years of education: Not on file  . Highest education level: Not on file  Occupational History  . Not on file  Social Needs  . Financial resource strain: Not on file  . Food insecurity    Worry: Not on file    Inability: Not on file  . Transportation needs    Medical: Not on file    Non-medical: Not on file  Tobacco Use  . Smoking status: Never Smoker  . Smokeless tobacco: Never Used  Substance and Sexual Activity  . Alcohol use: No  . Drug use: No  . Sexual activity: Never    Comment: declined   Lifestyle  . Physical activity    Days per week: Not on file    Minutes per session: Not on file  . Stress: Not on file  Relationships  . Social Herbalist on phone: Not on file    Gets together: Not on file    Attends religious service: Not on file    Active member of club or organization: Not on file    Attends meetings of clubs or organizations: Not on file    Relationship status: Not on file  . Intimate partner violence    Fear of  current or ex partner: Not on file    Emotionally abused: Not on file    Physically abused: Not on file    Forced sexual activity: Not on file  Other Topics Concern  . Not on file  Social History Narrative  . Not on file    Current Outpatient Medications on File Prior to Visit  Medication Sig Dispense Refill  . bictegravir-emtricitabine-tenofovir AF (BIKTARVY) 50-200-25 MG TABS tablet Take 1 tablet by mouth daily. 90 tablet 3  . estradiol (ESTRACE) 1 MG tablet Take 1 tablet (1 mg total) by mouth daily. 90 tablet 2   No current facility-administered medications on file prior to visit.     Allergies  Allergen Reactions  . Tomato Anaphylaxis and Rash  . Risperdal [Risperidone] Nausea And Vomiting  . Aspirin Itching    Family History  Problem Relation Age of Onset  . CAD Mother 53       MI  . Endocrine Disorder Neg Hx     BP (!) 142/80 (BP Location: Left Arm, Patient Position: Sitting, Cuff Size: Large)   Pulse 69   Ht  6\' 2"  (1.88 m)   Wt 278 lb 3.2 oz (126.2 kg)   SpO2 98%   BMI 35.72 kg/m    Review of Systems Denies sob    Objective:   Physical Exam VITAL SIGNS:  See vs page GENERAL: no distress Ext: no leg edema   Lab Results  Component Value Date   TSH 1.70 07/11/2018       Assessment & Plan:  HTN: is noted today Transgender state: she needs surgery to optimize effect of E2  Patient Instructions  Your blood pressure is high today.  Please see your primary care provider soon, to have it rechecked Blood tests are requested for you today.  We'll let you know about the results.  Please see a specialist, about the surgery.  you will receive a phone call, about a day and time for an appointment.   Please come back for a follow-up appointment in 3 months.

## 2019-01-21 NOTE — Telephone Encounter (Signed)
Returned pt call as requested. Pt expressed concerns about hormone levels and oral estrogen Rx. Asked for an appt to further discuss concerns. Scheduled to be seen today.

## 2019-01-21 NOTE — Patient Instructions (Addendum)
Your blood pressure is high today.  Please see your primary care provider soon, to have it rechecked Blood tests are requested for you today.  We'll let you know about the results.  Please see a specialist, about the surgery.  you will receive a phone call, about a day and time for an appointment.   Please come back for a follow-up appointment in 3 months.

## 2019-01-21 NOTE — Telephone Encounter (Signed)
Patient is requesting to speak with the nurse in regards to her medication. Did not won't to explain further.  Please Advise, Thanks

## 2019-01-22 MED FILL — BIKTARVY 50-200-25 MG TABS: 50-200-25 | 30 days supply | Qty: 30 | Fill #7

## 2019-01-25 LAB — ESTRADIOL, FREE
Estradiol, Free: 29.38 pg/mL — ABNORMAL HIGH
Estradiol: 1559 pg/mL — ABNORMAL HIGH

## 2019-01-26 ENCOUNTER — Other Ambulatory Visit: Payer: Self-pay | Admitting: Endocrinology

## 2019-01-26 MED ORDER — ESTRADIOL 0.5 MG PO TABS: 0.5000 mg | ORAL_TABLET | Freq: Every day | ORAL | 3 refills | Status: DC

## 2019-02-01 ENCOUNTER — Telehealth: Payer: Self-pay | Admitting: General Practice

## 2019-02-01 NOTE — Telephone Encounter (Signed)
Negative COVID results given. Patient results "NOT Detected." Caller expressed understanding. ° °

## 2019-02-18 MED FILL — BIKTARVY 50-200-25 MG TABS: 50-200-25 | 30 days supply | Qty: 30 | Fill #8

## 2019-02-28 ENCOUNTER — Telehealth: Payer: Self-pay | Admitting: Endocrinology

## 2019-02-28 NOTE — Telephone Encounter (Signed)
Pt did not have questions about lab results but rather wanted to know what the status was for his referral to [facility]. Advised I would forward this message to the referrals team for them to address the status of the referral. Advised she will receive a call with a status update from the referral team. Verbalized acceptance and understanding.

## 2019-02-28 NOTE — Telephone Encounter (Signed)
Patient requests to be called at ph# [phone] to be given his lab results.

## 2019-03-08 MED FILL — BIKTARVY 50-200-25 MG TABS: 50-200-25 | 30 days supply | Qty: 30 | Fill #8

## 2019-03-11 NOTE — Telephone Encounter (Signed)
This referral has been sent

## 2019-03-29 ENCOUNTER — Emergency Department (HOSPITAL_COMMUNITY)
Admission: EM | Admit: 2019-03-29 | Discharge: 2019-03-29 | Disposition: A | Discharge status: Home / Self Care | Payer: Medicare Other | Attending: Emergency Medicine | Admitting: Emergency Medicine

## 2019-03-29 ENCOUNTER — Other Ambulatory Visit: Payer: Self-pay

## 2019-03-29 ENCOUNTER — Encounter (HOSPITAL_COMMUNITY): Payer: Self-pay | Admitting: *Deleted

## 2019-03-29 DIAGNOSIS — Z5321 Procedure and treatment not carried out due to patient leaving prior to being seen by health care provider: Secondary | ICD-10-CM | POA: Diagnosis not present

## 2019-03-29 DIAGNOSIS — M25512 Pain in left shoulder: Secondary | ICD-10-CM | POA: Insufficient documentation

## 2019-03-29 DIAGNOSIS — R519 Headache, unspecified: Secondary | ICD-10-CM | POA: Diagnosis present

## 2019-03-29 HISTORY — DX: Immunodeficiency, unspecified: D84.9

## 2019-03-29 NOTE — ED Notes (Signed)
Called pt x2 for room, no response. 

## 2019-03-29 NOTE — ED Notes (Signed)
Called pt x3 for room, no response. 
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
## 2019-04-09 ENCOUNTER — Ambulatory Visit: Payer: Medicare Other | Admitting: Infectious Diseases

## 2019-04-11 MED FILL — BIKTARVY 50-200-25 MG TABS: 50-200-25 | 30 days supply | Qty: 30 | Fill #9

## 2019-04-15 ENCOUNTER — Telehealth: Payer: Self-pay | Admitting: Endocrinology

## 2019-04-15 NOTE — Telephone Encounter (Signed)
Note   Patient Result Comments for Estradiol  Written by Renato Shin, MD on 01/26/2019 9:42 AM Hi Hector [name]:  The estrogen is still high. I have sent a prescription to your pharmacy, to reduce the estradiol pill. I'll see you next time.  Above was sent via My Chart to pt. Called pt and read the above message. Pt states she does not want to take pills but rather an injection. Please advise.     Closing this encounter as duplicate. This is being addressed in a previously created encounter. Awaiting Dr. Cordelia Pen response.

## 2019-04-15 NOTE — Telephone Encounter (Signed)
Patient states she has been requesting to be called at ph# [phone] for at least 2 weeks to be given her lab results but has not heard from our office to date. Please call patient at the ph# listed above to give patient her lab results.

## 2019-04-15 NOTE — Telephone Encounter (Signed)
We should discuss this at [place].  OK to move up this this week.

## 2019-04-15 NOTE — Telephone Encounter (Signed)
Patient Result Comments for Estradiol  Written by Renato Shin, MD on 01/26/2019 9:42 AM Hi [name]:  The estrogen is still high. I have sent a prescription to your pharmacy, to reduce the estradiol pill. I'll see you next time.  Above was sent via My Chart to pt. Called pt and read the above message. Pt states she does not want to take pills but rather an injection. Please advise.

## 2019-04-15 NOTE — Telephone Encounter (Signed)
Patient requests a RX for estrogen injections instead of estradiol (ESTRACE) 0.5 MG tablet be sent to:  [id number] [address], [place] [phone] (Phone) [phone] (Fax)

## 2019-04-16 ENCOUNTER — Other Ambulatory Visit: Payer: Self-pay

## 2019-04-16 ENCOUNTER — Encounter: Payer: Self-pay | Admitting: Endocrinology

## 2019-04-16 ENCOUNTER — Ambulatory Visit (INDEPENDENT_AMBULATORY_CARE_PROVIDER_SITE_OTHER): Payer: Medicare Other | Admitting: Endocrinology

## 2019-04-16 VITALS — BP 124/72 | HR 73 | Ht 74.0 in | Wt 261.6 lb

## 2019-04-16 DIAGNOSIS — F640 Transsexualism: Secondary | ICD-10-CM | POA: Diagnosis not present

## 2019-04-16 DIAGNOSIS — Z789 Other specified health status: Secondary | ICD-10-CM

## 2019-04-16 NOTE — Telephone Encounter (Signed)
Patient is scheduled for appointment 04/16/19 at 10:30 a.m.

## 2019-04-16 NOTE — Patient Instructions (Addendum)
Blood tests are requested for you today.  We'll let you know about the results.   Based on the results, I hope to add a pill called "elagolix."   Please continue to pursue the surgery.   Please come back for a follow-up appointment in 2 months.

## 2019-04-16 NOTE — Progress Notes (Signed)
Subjective:    Patient ID: Hector [name], adult    DOB: 1986/04/13, 33 y.o.   MRN: [id number]  HPI Pt returns for f/u of transgender state (M to F):  Surgery: never Medication: E2 since 2020 Counseling: never Other: Pt says she went to [facility] to schedule orchiectomy, but they have delayed, due to coronavirus.   Interval hx: Pt says very little progress on breast growth.  she is still considering the orchiectomy.  she takes E2 as rx'ed.  She denies taking E2 from any other source.   Past Medical History:  Diagnosis Date  . Depression   . Headache   . History of fractured vertebra april 8th Diagnosed   [place] out of a tree while climbing it.  . Human immunodeficiency virus I infection ([place]) 2016  . Hypertension   . Immune deficiency disorder ([place])   . Mental disorder   . Myocardial infarction [facility])     Past Surgical History:  Procedure Laterality Date  . NO PAST SURGERIES      Social History   Socioeconomic History  . Marital status: Single    Spouse name: Not on file  . Number of children: Not on file  . Years of education: Not on file  . Highest education level: Not on file  Occupational History  . Not on file  Social Needs  . Financial resource strain: Not on file  . Food insecurity    Worry: Not on file    Inability: Not on file  . Transportation needs    Medical: Not on file    Non-medical: Not on file  Tobacco Use  . Smoking status: Never Smoker  . Smokeless tobacco: Never Used  Substance and Sexual Activity  . Alcohol use: No  . Drug use: No  . Sexual activity: Never    Comment: declined   Lifestyle  . Physical activity    Days per week: Not on file    Minutes per session: Not on file  . Stress: Not on file  Relationships  . Social Herbalist on phone: Not on file    Gets together: Not on file    Attends religious service: Not on file    Active member of club or organization: Not on file    Attends meetings of clubs or organizations: Not  on file    Relationship status: Not on file  . Intimate partner violence    Fear of current or ex partner: Not on file    Emotionally abused: Not on file    Physically abused: Not on file    Forced sexual activity: Not on file  Other Topics Concern  . Not on file  Social History Narrative  . Not on file    Current Outpatient Medications on File Prior to Visit  Medication Sig Dispense Refill  . bictegravir-emtricitabine-tenofovir AF (BIKTARVY) 50-200-25 MG TABS tablet Take 1 tablet by mouth daily. 90 tablet 3  . estradiol (ESTRACE) 0.5 MG tablet Take 1 tablet (0.5 mg total) by mouth daily. 90 tablet 3   No current facility-administered medications on file prior to visit.     Allergies  Allergen Reactions  . Tomato Anaphylaxis and Rash  . Risperdal [Risperidone] Nausea And Vomiting  . Aspirin Itching    Family History  Problem Relation Age of Onset  . CAD Mother 64       MI  . Endocrine Disorder Neg Hx     BP 124/72 (BP Location: Left  Arm, Patient Position: Sitting, Cuff Size: Large)   Pulse 73   Ht 6\' 2"  (1.88 m)   Wt 261 lb 9.6 oz (118.7 kg)   SpO2 98%   BMI 33.59 kg/m    Review of Systems Denies sob.     Objective:   Physical Exam VITAL SIGNS:  See vs page.  GENERAL: no distress.  EXT: no leg swelling.        Assessment & Plan:  Transgender state: aggressiveness of rx is limited by the fact that E2 is high on a small dosage.    Patient Instructions  Blood tests are requested for you today.  We'll let you know about the results.   Based on the results, I hope to add a pill called "elagolix."   Please continue to pursue the surgery.   Please come back for a follow-up appointment in 2 months.

## 2019-04-16 NOTE — Telephone Encounter (Signed)
Please refer to Dr. Ellison's response 

## 2019-04-18 LAB — TESTOSTERONE,FREE AND TOTAL
Testosterone, Free: 12.1 pg/mL (ref 8.7–25.1)
Testosterone: 426 ng/dL (ref 264–916)

## 2019-04-22 ENCOUNTER — Ambulatory Visit: Payer: Medicare Other | Admitting: Endocrinology

## 2019-04-24 LAB — ESTRADIOL, FREE
Estradiol, Free: 0.86 pg/mL — ABNORMAL HIGH
Estradiol: 43 pg/mL — ABNORMAL HIGH

## 2019-04-25 ENCOUNTER — Ambulatory Visit: Payer: Medicare Other | Admitting: Infectious Diseases

## 2019-04-26 ENCOUNTER — Telehealth: Payer: Self-pay

## 2019-04-26 ENCOUNTER — Other Ambulatory Visit: Payer: Self-pay | Admitting: Endocrinology

## 2019-04-26 MED ORDER — ELAGOLIX SODIUM 150 MG PO TABS: 150.0000 mg | ORAL_TABLET | Freq: Every day | ORAL | 11 refills | Status: DC

## 2019-04-26 NOTE — Telephone Encounter (Signed)
LVM requesting returned call 

## 2019-04-26 NOTE — Telephone Encounter (Signed)
Received notification verbally that pt had returned my call while I was in a meeting. Returned pt call. Informed about Dr. Cordelia Pen response. Using closed-loop communication, pt verbalized complete acceptance and understanding of all information provided. No further questions nor concerns were voiced at this time.

## 2019-04-26 NOTE — Telephone Encounter (Signed)
Lab results reviewed by Dr. Loanne Drilling. Called pt to inform about lab results as well as new orders. Education given about medication. However, pt is not agreeable to this medication, stating "if this is not going to allow my breasts to grow, then I don't want it." Advised I would discuss this concern with Dr. Loanne Drilling. Verbalized acceptance and understanding.

## 2019-04-26 NOTE — Telephone Encounter (Signed)
This medication lowers the testosterone.  No guarantee, but this gives you the best chance at breast growth.

## 2019-04-26 NOTE — Telephone Encounter (Signed)
-----   Message from Renato Shin, MD sent at 04/26/2019 12:05 PM EST ----- please contact patient: Testosterone is still too high.  I have sent a prescription to your pharmacy, for "elagolix."  This will help I'll see you next time.

## 2019-04-29 ENCOUNTER — Telehealth: Payer: Self-pay

## 2019-04-29 MED ORDER — DUTASTERIDE 0.5 MG PO CAPS: 0.5000 mg | ORAL_CAPSULE | Freq: Every day | ORAL | 5 refills | Status: DC

## 2019-04-29 NOTE — Telephone Encounter (Signed)
Received notification from [facility] stating pt out of pocket expense would be $1100.00. Routing this message to Dr. Loanne Drilling for him to review and address.

## 2019-04-29 NOTE — Telephone Encounter (Signed)
please contact patient: Sorry that the medication is too expensive. I'll see you next time.

## 2019-04-29 NOTE — Telephone Encounter (Signed)
OK, I have sent a prescription to your pharmacy, to an additional med.  I'll see you next time.

## 2019-04-29 NOTE — Telephone Encounter (Signed)
Pt will want to know if there are any other alternatives or suggestions. Please advise

## 2019-04-29 NOTE — Telephone Encounter (Signed)
dutasteride (AVODART) 0.5 MG capsule 30 capsule 5 04/29/2019    Sig - Route: Take 1 capsule (0.5 mg total) by mouth daily. - Oral   Sent to pharmacy as: dutasteride (AVODART) 0.5 MG capsule   E-Prescribing Status: Receipt confirmed by pharmacy (04/29/2019 2:38 PM EST)    Called pt and informed about the above Rx. Pt is aware that although this Rx is meant for urinary retention, a side effect to this drug is breast enlargement. Using closed-loop communication, pt verbalized complete acceptance and understanding of all information provided. No further questions nor concerns were voiced at this time.

## 2019-04-29 NOTE — Addendum Note (Signed)
Addended by: Renato Shin on: 04/29/2019 02:39 PM   Modules accepted: Orders

## 2019-04-29 NOTE — Telephone Encounter (Signed)
Aware of this concern as a fax correspondence was received from pt pharmacy and given to Dr. Loanne Drilling for him to review and address. Pt will receive a call once Dr. Loanne Drilling has addressed this fax.

## 2019-04-29 NOTE — Telephone Encounter (Signed)
Patient called in stating his insurance wont pay fir his medication.   Please call and advise

## 2019-05-05 ENCOUNTER — Emergency Department (HOSPITAL_COMMUNITY)
Admission: EM | Admit: 2019-05-05 | Discharge: 2019-05-05 | Disposition: A | Discharge status: Home / Self Care | Payer: Medicare Other | Attending: Emergency Medicine | Admitting: Emergency Medicine

## 2019-05-05 ENCOUNTER — Encounter (HOSPITAL_COMMUNITY): Payer: Self-pay | Admitting: Emergency Medicine

## 2019-05-05 ENCOUNTER — Other Ambulatory Visit: Payer: Self-pay

## 2019-05-05 DIAGNOSIS — F649 Gender identity disorder, unspecified: Secondary | ICD-10-CM | POA: Diagnosis not present

## 2019-05-05 DIAGNOSIS — I10 Essential (primary) hypertension: Secondary | ICD-10-CM | POA: Insufficient documentation

## 2019-05-05 DIAGNOSIS — Y9389 Activity, other specified: Secondary | ICD-10-CM | POA: Insufficient documentation

## 2019-05-05 DIAGNOSIS — S61303A Unspecified open wound of left middle finger with damage to nail, initial encounter: Secondary | ICD-10-CM | POA: Diagnosis not present

## 2019-05-05 DIAGNOSIS — Y999 Unspecified external cause status: Secondary | ICD-10-CM | POA: Insufficient documentation

## 2019-05-05 DIAGNOSIS — Y929 Unspecified place or not applicable: Secondary | ICD-10-CM | POA: Diagnosis not present

## 2019-05-05 DIAGNOSIS — S60042A Contusion of left ring finger without damage to nail, initial encounter: Secondary | ICD-10-CM | POA: Insufficient documentation

## 2019-05-05 DIAGNOSIS — Z21 Asymptomatic human immunodeficiency virus [HIV] infection status: Secondary | ICD-10-CM | POA: Diagnosis not present

## 2019-05-05 DIAGNOSIS — Z79899 Other long term (current) drug therapy: Secondary | ICD-10-CM | POA: Insufficient documentation

## 2019-05-05 DIAGNOSIS — S61309A Unspecified open wound of unspecified finger with damage to nail, initial encounter: Secondary | ICD-10-CM

## 2019-05-05 DIAGNOSIS — S6992XA Unspecified injury of left wrist, hand and finger(s), initial encounter: Secondary | ICD-10-CM | POA: Diagnosis present

## 2019-05-05 DIAGNOSIS — M545 Low back pain: Secondary | ICD-10-CM | POA: Diagnosis not present

## 2019-05-05 MED ORDER — LIDOCAINE HCL 2 % IJ SOLN
10.0000 mL | Freq: Once | INTRAMUSCULAR | Status: AC
  Administered 2019-05-05: 200 mg via INTRADERMAL
  Filled 2019-05-05: qty 20

## 2019-05-05 NOTE — Discharge Instructions (Signed)
Follow instruction below.  Wear finger splint for protection.  Return if you have any concerns.
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
## 2019-05-07 ENCOUNTER — Ambulatory Visit: Payer: Medicare Other | Admitting: Infectious Diseases

## 2019-05-27 MED FILL — BIKTARVY 50-200-25 MG TABS: 50-200-25 | 30 days supply | Qty: 30 | Fill #10

## 2019-06-03 ENCOUNTER — Other Ambulatory Visit: Payer: Self-pay | Admitting: Infectious Diseases

## 2019-06-03 DIAGNOSIS — Z113 Encounter for screening for infections with a predominantly sexual mode of transmission: Secondary | ICD-10-CM

## 2019-06-18 ENCOUNTER — Other Ambulatory Visit: Payer: Self-pay

## 2019-06-18 ENCOUNTER — Other Ambulatory Visit: Payer: Self-pay | Admitting: Infectious Diseases

## 2019-06-18 DIAGNOSIS — B20 Human immunodeficiency virus [HIV] disease: Secondary | ICD-10-CM

## 2019-06-18 MED ORDER — BIKTARVY 50-200-25 MG PO TABS: 1.0000 | ORAL_TABLET | Freq: Every day | ORAL | 0 refills | Status: DC

## 2019-06-19 ENCOUNTER — Telehealth: Payer: Self-pay | Admitting: Endocrinology

## 2019-06-19 MED FILL — BIKTARVY 50-200-25 MG TABS: 50-200-25 | 30 days supply | Qty: 30 | Fill #0

## 2019-06-19 NOTE — Telephone Encounter (Signed)
Elagolix Sodium was changed to Avodart d/t Rx being unaffordable. Per Dr. Everardo All, Avodart is meant for urinary retention but a side effect to this drug is breast enlargement. Routing pt concern to Dr. Everardo All for him to review and to advise.

## 2019-06-19 NOTE — Telephone Encounter (Signed)
Attempted to call pt to inform about Dr. George Hugh response below. Unable to reach d/t no answer.

## 2019-06-19 NOTE — Telephone Encounter (Signed)
That is a second best to the elagolix, but we'll discuss further next week

## 2019-06-19 NOTE — Telephone Encounter (Signed)
Patient requests to be called at ph# [phone] re: dutasteride (AVODART) 0.5 MG capsule is not working.

## 2019-06-21 ENCOUNTER — Other Ambulatory Visit: Payer: Self-pay

## 2019-06-21 ENCOUNTER — Ambulatory Visit (INDEPENDENT_AMBULATORY_CARE_PROVIDER_SITE_OTHER): Payer: Medicare Other | Admitting: Infectious Diseases

## 2019-06-21 ENCOUNTER — Encounter: Payer: Self-pay | Admitting: Infectious Diseases

## 2019-06-21 VITALS — Wt 275.0 lb

## 2019-06-21 DIAGNOSIS — F640 Transsexualism: Secondary | ICD-10-CM

## 2019-06-21 DIAGNOSIS — B20 Human immunodeficiency virus [HIV] disease: Secondary | ICD-10-CM | POA: Diagnosis not present

## 2019-06-21 DIAGNOSIS — Z113 Encounter for screening for infections with a predominantly sexual mode of transmission: Secondary | ICD-10-CM

## 2019-06-21 DIAGNOSIS — F250 Schizoaffective disorder, bipolar type: Secondary | ICD-10-CM

## 2019-06-21 DIAGNOSIS — Z789 Other specified health status: Secondary | ICD-10-CM

## 2019-06-21 DIAGNOSIS — I10 Essential (primary) hypertension: Secondary | ICD-10-CM

## 2019-06-21 DIAGNOSIS — Z79899 Other long term (current) drug therapy: Secondary | ICD-10-CM

## 2019-06-21 NOTE — Progress Notes (Signed)
   Subjective:    Patient ID: Hector [name], adult    DOB: [birth year], 34 y.o.   MRN: [id number]  HPI 34 yo M--> Ffirst dx with HIV 2016. Was prev being cared for in [place]. Was prev on DRVr/TRV. Was seen at [facility] 07-2016 and started on triumeq after a med lapse.   S/he was restarted on biktarvy 05-2018 after another med lapse.   Has seen endo for transgender issues.  Has gotten married. Partner is negative. Is on prep.  Denies missed ART, no problems.   HIV 1 RNA Quant (copies/mL)  Date Value  08/30/2018 39 (H)  06/08/2018 1,420 (H)  03/28/2017 31,600 (H)   CD4 T Cell Abs (/uL)  Date Value  08/30/2018 650  06/08/2018 440  03/28/2017 610    Review of Systems  Constitutional: Negative for appetite change and unexpected weight change.  Respiratory: Negative for cough and shortness of breath.   Gastrointestinal: Negative for constipation and diarrhea.  Genitourinary: Negative for difficulty urinating.  Neurological: Negative for headaches.  Psychiatric/Behavioral: Negative for sleep disturbance.  exercising.  Please see HPI. All other systems reviewed and negative.      Objective:   Physical Exam Constitutional:      Appearance: Normal appearance. She is obese.  HENT:     Mouth/Throat:     Mouth: Mucous membranes are moist.     Pharynx: No oropharyngeal exudate.  Eyes:     Extraocular Movements: Extraocular movements intact.     Pupils: Pupils are equal, round, and reactive to light.  Cardiovascular:     Rate and Rhythm: Normal rate and regular rhythm.  Pulmonary:     Effort: Pulmonary effort is normal.     Breath sounds: Normal breath sounds.  Abdominal:     General: Bowel sounds are normal. There is no distension.     Palpations: Abdomen is soft.     Tenderness: There is no abdominal tenderness.  Musculoskeletal:        General: Normal range of motion.     Cervical back: Normal range of motion and neck supple.     Right lower leg: No edema.     Left  lower leg: No edema.  Neurological:     General: No focal deficit present.     Mental Status: She is alert.  Psychiatric:        Mood and Affect: Mood normal.           Assessment & Plan:

## 2019-06-21 NOTE — Assessment & Plan Note (Signed)
Mood is steady.  Will continue to watch

## 2019-06-21 NOTE — Assessment & Plan Note (Signed)
Asx, doing well off rx.  Will continue to watch.

## 2019-06-21 NOTE — Assessment & Plan Note (Signed)
Appreciate f/u with endo.

## 2019-06-21 NOTE — Assessment & Plan Note (Signed)
No problems with ART.  Has been feeling well, now married, partner on prep.  Needs to leave for funeral (father).  Will check labs this week.  PCV23 at his lab visit.  O/w rtc in 9 months.

## 2019-06-24 ENCOUNTER — Ambulatory Visit: Payer: Medicare Other | Admitting: Endocrinology

## 2019-07-02 ENCOUNTER — Ambulatory Visit: Payer: Medicare Other | Admitting: Endocrinology

## 2019-07-09 ENCOUNTER — Other Ambulatory Visit: Payer: Self-pay | Admitting: Infectious Diseases

## 2019-07-09 DIAGNOSIS — B20 Human immunodeficiency virus [HIV] disease: Secondary | ICD-10-CM

## 2019-07-10 ENCOUNTER — Ambulatory Visit: Payer: Medicare Other | Admitting: Endocrinology

## 2019-07-10 ENCOUNTER — Telehealth: Payer: Self-pay | Admitting: Pharmacy Technician

## 2019-07-10 NOTE — Telephone Encounter (Signed)
[facility] Patient Advocate Encounter  Tried to reach the patient several times this morning to setup the refill of their medication. When I said where I was calling from the patient hung up. I called back, the patient answered and hung up again. Attempted to call back later to leave a voicemail of pharmacy details but no voicemail is currently set up. Script is currently at [facility] on hold.

## 2019-07-12 ENCOUNTER — Telehealth: Payer: Self-pay | Admitting: Pharmacy Technician

## 2019-07-12 MED FILL — BIKTARVY 50-200-25 MG TABS: 50-200-25 | 30 days supply | Qty: 30 | Fill #0

## 2019-07-12 NOTE — Telephone Encounter (Signed)
[facility] Patient Advocate Encounter   I was successful in securing patient a $7500 grant from [employer] ([employer]) to provide copayment coverage for Biktarvy. This will make the out of pocket cost $0.     I have spoken with the patient and he will have his medication mailed from [facility] pharmacy today to arrive 02/22. The name on insurance is still listed as Hector [name] so the medicine will arrive that way.    The billing information is  RxBin: [id number] PCN: [id number] Member ID: [id number] Group ID: [id number] Dates of Eligibility: 07/12/2019 through 07/11/2020  Patient knows to call the office with questions or concerns.  Beulah Gandy, CPhT Specialty Pharmacy Patient [facility] Phone: [phone] Fax: [phone] 07/12/2019 10:24 AM

## 2019-08-09 MED FILL — BIKTARVY 50-200-25 MG TABS: 50-200-25 | 30 days supply | Qty: 30 | Fill #1

## 2019-08-14 NOTE — Telephone Encounter (Signed)
Patient calling today stating Avodart is not working and has not been working for [employer] reports he has complained about this issue several times and he cant get the nurse to even call him back and he feels he is not getting the care from this office that he expects-I did advise the patient that there was an ATC him on 06/19/19 but there was no answer-he wants a call back from the nurse today to discuss his issue with this medication

## 2019-08-14 NOTE — Telephone Encounter (Signed)
In light of concern documented below, with Team Lead present, attempt was made to return pt call on #'s indicated below:  [phone] Judie Petit) Hector [name] Brother [phone]   [phone] was a hispanic speaking male who indicated he was NOT the name requested in which to speak with. [phone] was also called but VM is not set up in which to LVM for pt to return my call. This is being routed to the Practice Admin, Team Lead and back to originator of this message as the returned # for this pt was NOT obtained for me to promptly return call as pt had requested.

## 2019-08-15 NOTE — Telephone Encounter (Signed)
Called pt at the number provided in the original message from last month. Informed the pt of the message per Dr. Everardo All. Scheduled an appt for tomorrow. Pt states used Hormone injections and they were working when in [place]. States they were 40 or 50 mg liquid estrogen hormone inj

## 2019-08-15 NOTE — Telephone Encounter (Signed)
FYI

## 2019-08-16 ENCOUNTER — Other Ambulatory Visit: Payer: Self-pay

## 2019-08-16 ENCOUNTER — Encounter: Payer: Self-pay | Admitting: Endocrinology

## 2019-08-16 ENCOUNTER — Ambulatory Visit (INDEPENDENT_AMBULATORY_CARE_PROVIDER_SITE_OTHER): Payer: Medicare Other | Admitting: Endocrinology

## 2019-08-16 VITALS — BP 134/84 | HR 85 | Ht 74.0 in | Wt 277.6 lb

## 2019-08-16 DIAGNOSIS — F640 Transsexualism: Secondary | ICD-10-CM

## 2019-08-16 DIAGNOSIS — Z789 Other specified health status: Secondary | ICD-10-CM

## 2019-08-16 MED ORDER — ESTRADIOL CYPIONATE 5 MG/ML IM OIL: 1.5000 mg | TOPICAL_OIL | INTRAMUSCULAR | 12 refills | Status: DC

## 2019-08-16 NOTE — Patient Instructions (Addendum)
I have sent a prescription to your pharmacy, for the estrogen injections. After you pick it up, please call and make a nurse appointment here, to get your first injection. Please continue to pursue the surgery.  Please see a urology specialist, at [facility].  you will receive a phone call, about a day and time for an appointment.  If not, please call [phone].   Please come back for a follow-up appointment in 3 months.

## 2019-08-16 NOTE — Progress Notes (Signed)
Subjective:    Patient ID: Hector [name], adult    DOB: 1986/04/24, 34 y.o.   MRN: [id number]  HPI Pt returns for f/u of transgender state (M to F):  Surgery: never Medication: E2 since 2020 Counseling: never Other: Pt says she went to [facility] to schedule orchiectomy, but they have delayed, due to coronavirus.  SDOH: she could not afford elagolix.   Interval hx: Pt says very little progress on breast growth. she is still considering the orchiectomy.  she takes E2 as rx'ed, but she prefers to take via injection.   Past Medical History:  Diagnosis Date  . Depression   . Headache   . History of fractured vertebra april 8th Diagnosed   [place] out of a tree while climbing it.  . Human immunodeficiency virus I infection ([place]) 2016  . Hypertension   . Immune deficiency disorder ([place])   . Mental disorder   . Myocardial infarction [facility])     Past Surgical History:  Procedure Laterality Date  . NO PAST SURGERIES      Social History   Socioeconomic History  . Marital status: Single    Spouse name: Not on file  . Number of children: Not on file  . Years of education: Not on file  . Highest education level: Not on file  Occupational History  . Not on file  Tobacco Use  . Smoking status: Never Smoker  . Smokeless tobacco: Never Used  Substance and Sexual Activity  . Alcohol use: No  . Drug use: No  . Sexual activity: Never    Comment: declined   Other Topics Concern  . Not on file  Social History Narrative  . Not on file   Social Determinants of Health   Financial Resource Strain:   . Difficulty of Paying Living Expenses:   Food Insecurity:   . Worried About Charity fundraiser in the Last Year:   . Arboriculturist in the Last Year:   Transportation Needs:   . Film/video editor (Medical):   Marland Kitchen Lack of Transportation (Non-Medical):   Physical Activity:   . Days of Exercise per Week:   . Minutes of Exercise per Session:   Stress:   . Feeling of Stress :   Social  Connections:   . Frequency of Communication with Friends and Family:   . Frequency of Social Gatherings with Friends and Family:   . Attends Religious Services:   . Active Member of Clubs or Organizations:   . Attends Archivist Meetings:   Marland Kitchen Marital Status:   Intimate Partner Violence:   . Fear of Current or Ex-Partner:   . Emotionally Abused:   Marland Kitchen Physically Abused:   . Sexually Abused:     Current Outpatient Medications on File Prior to Visit  Medication Sig Dispense Refill  . BIKTARVY 50-200-25 MG TABS tablet TAKE 1 TABLET BY MOUTH DAILY. 30 tablet 5  . dutasteride (AVODART) 0.5 MG capsule Take 1 capsule (0.5 mg total) by mouth daily. 30 capsule 5  . Elagolix Sodium 150 MG TABS Take 150 mg by mouth daily. 30 tablet 11   No current facility-administered medications on file prior to visit.    Allergies  Allergen Reactions  . Tomato Anaphylaxis and Rash  . Risperdal [Risperidone] Nausea And Vomiting  . Aspirin Itching    Family History  Problem Relation Age of Onset  . CAD Mother 71       MI  . Endocrine Disorder  Neg Hx     BP 134/84   Pulse 85   Ht 6\' 2"  (1.88 m)   Wt 277 lb 9.6 oz (125.9 kg)   SpO2 98%   BMI 35.64 kg/m    Review of Systems Denies sob.     Objective:   Physical Exam VITAL SIGNS:  See vs page GENERAL: no distress Ext: no leg edema  Lab Results  Component Value Date   TSH 1.70 07/11/2018        Assessment & Plan:  Transgender state: slow clinical progress  Patient Instructions  I have sent a prescription to your pharmacy, for the estrogen injections. After you pick it up, please call and make a nurse appointment here, to get your first injection. Please continue to pursue the surgery.  Please see a urology specialist, at [facility]  you will receive a phone call, about a day and time for an appointment.  If not, please call [phone].   Please come back for a follow-up appointment in 3 months.

## 2019-08-21 ENCOUNTER — Other Ambulatory Visit: Payer: Self-pay

## 2019-08-21 ENCOUNTER — Ambulatory Visit (INDEPENDENT_AMBULATORY_CARE_PROVIDER_SITE_OTHER): Payer: Medicare Other

## 2019-08-21 DIAGNOSIS — Z789 Other specified health status: Secondary | ICD-10-CM

## 2019-08-21 DIAGNOSIS — F640 Transsexualism: Secondary | ICD-10-CM

## 2019-08-21 MED ORDER — ESTRADIOL CYPIONATE 5 MG/ML IM OIL
1.5000 mg | TOPICAL_OIL | Freq: Once | INTRAMUSCULAR | Status: AC
  Administered 2019-08-21: 10:00:00 1.5 mg via INTRAMUSCULAR

## 2019-08-21 NOTE — Progress Notes (Signed)
Per orders of Dr. Everardo All injection of Depo-Estradiol 5mg /mL given today by N.Oyuki Hogan,LPN. Amount given was 0.72mL (1.5mg  total) into left ventrogluteal muscle. Patient tolerated injection well. Patient was educated on proper injction technique and given hand out on properly drawing up, verifying, and administering medication, should she decide to administer at home. Pt has decided that she would prefer to come to office every 28 days to have shot, rather than self administer. However, patient teaching was still provided and patient verbalized understanding and properly recalled correct technique.  Patient was scheduled for another injection 28 days from now to continue therapy, without interruption.

## 2019-08-22 ENCOUNTER — Ambulatory Visit: Payer: Medicare Other

## 2019-09-18 ENCOUNTER — Ambulatory Visit: Payer: Medicare Other

## 2019-09-19 ENCOUNTER — Ambulatory Visit: Payer: Medicare Other

## 2019-09-24 ENCOUNTER — Emergency Department (HOSPITAL_COMMUNITY)
Admission: EM | Admit: 2019-09-24 | Discharge: 2019-09-24 | Disposition: A | Discharge status: Home / Self Care | Payer: Medicare Other | Attending: Emergency Medicine | Admitting: Emergency Medicine

## 2019-09-24 ENCOUNTER — Other Ambulatory Visit: Payer: Self-pay

## 2019-09-24 ENCOUNTER — Encounter (HOSPITAL_COMMUNITY): Payer: Self-pay | Admitting: Emergency Medicine

## 2019-09-24 DIAGNOSIS — S61305A Unspecified open wound of left ring finger with damage to nail, initial encounter: Secondary | ICD-10-CM | POA: Insufficient documentation

## 2019-09-24 DIAGNOSIS — S61309A Unspecified open wound of unspecified finger with damage to nail, initial encounter: Secondary | ICD-10-CM

## 2019-09-24 DIAGNOSIS — Y929 Unspecified place or not applicable: Secondary | ICD-10-CM | POA: Diagnosis not present

## 2019-09-24 DIAGNOSIS — Y939 Activity, unspecified: Secondary | ICD-10-CM | POA: Diagnosis not present

## 2019-09-24 DIAGNOSIS — Y999 Unspecified external cause status: Secondary | ICD-10-CM | POA: Diagnosis not present

## 2019-09-24 DIAGNOSIS — X500XXA Overexertion from strenuous movement or load, initial encounter: Secondary | ICD-10-CM | POA: Insufficient documentation

## 2019-09-24 MED ORDER — LIDOCAINE HCL 2 % IJ SOLN
10.0000 mL | Freq: Once | INTRAMUSCULAR | Status: DC
  Filled 2019-09-24: qty 20

## 2019-09-24 MED ORDER — ACETAMINOPHEN 500 MG PO TABS
1000.0000 mg | ORAL_TABLET | Freq: Once | ORAL | Status: AC
  Administered 2019-09-24: 1000 mg via ORAL
  Filled 2019-09-24: qty 2

## 2019-09-24 NOTE — ED Provider Notes (Signed)
[facility] EMERGENCY DEPARTMENT Provider Note   CSN: [id number] Arrival date & time: 09/24/19  0741     History Chief Complaint  Patient presents with  . Nail Problem    Hector [name] is a 34 y.o. adult (transgender male to male) that presents to the emergency department for nail injury on her left fourth digit.  She had artificial nails on for about a week and then nail got caught on a box this morning and ripped her fingernail back.  She states that it was bleeding for a couple minutes, however the bleeding stopped.  She is not on any blood thinners.  She is not exhibiting pain anywhere else.  No weakness in her fingers or wrist.  No numbness or tingling in her fingers.  No other complaints.  HPI     Past Medical History:  Diagnosis Date  . Depression   . Headache   . History of fractured vertebra april 8th Diagnosed   Larey Seat out of a tree while climbing it.  . Human immunodeficiency virus I infection (HCC) 2016  . Hypertension   . Immune deficiency disorder (HCC)   . Mental disorder   . Myocardial infarction [facility])     Patient Active Problem List   Diagnosis Date Noted  . Transgender 06/14/2018  . Hx of neurosyphilis 09/16/2016  . Essential hypertension 09/16/2016  . Human immunodeficiency virus I infection (HCC) 07/20/2016  . Hepatitis B immune 07/18/2016  . Non-compliance with treatment 10/06/2011  . Insomnia related to another mental disorder 10/04/2011  . Schizoaffective disorder, bipolar type (HCC) 10/03/2011    Class: Acute    Past Surgical History:  Procedure Laterality Date  . NO PAST SURGERIES         Family History  Problem Relation Age of Onset  . CAD Mother 11       MI  . Endocrine Disorder Neg Hx     Social History   Tobacco Use  . Smoking status: Never Smoker  . Smokeless tobacco: Never Used  Substance Use Topics  . Alcohol use: No  . Drug use: No    Home Medications Prior to Admission medications   Medication  Sig Start Date End Date Taking? Authorizing Provider  BIKTARVY 50-200-25 MG TABS tablet TAKE 1 TABLET BY MOUTH DAILY. 07/09/19   Ginnie Smart, MD  dutasteride (AVODART) 0.5 MG capsule Take 1 capsule (0.5 mg total) by mouth daily. 04/29/19   Romero Belling, MD  Elagolix Sodium 150 MG TABS Take 150 mg by mouth daily. 04/26/19   Romero Belling, MD  estradiol cypionate (DEPO-ESTRADIOL) 5 MG/ML injection Inject 0.3 mLs (1.5 mg total) into the muscle every 28 (twenty-eight) days. And syringes 1/month. 08/16/19   Romero Belling, MD    Allergies    Tomato, Risperdal [risperidone], and Aspirin  Review of Systems   Review of Systems  Constitutional: Negative for chills, diaphoresis, fatigue and fever.  HENT: Negative for congestion, sore throat and trouble swallowing.   Eyes: Negative for pain and visual disturbance.  Respiratory: Negative for cough, shortness of breath and wheezing.   Cardiovascular: Negative for chest pain, palpitations and leg swelling.  Gastrointestinal: Negative for abdominal distention, abdominal pain, diarrhea, nausea and vomiting.  Genitourinary: Negative for difficulty urinating.  Musculoskeletal: Negative for back pain, neck pain and neck stiffness.       Left fourth digit pain around nail   Skin: Negative for pallor.  Neurological: Negative for dizziness, speech difficulty, weakness and headaches.  Psychiatric/Behavioral:  Negative for confusion.    Physical Exam Updated Vital Signs BP (!) 155/97 (BP Location: Right Arm)   Pulse 65   Temp 97.9 F (36.6 C) (Oral)   Resp 15   Ht 6\' 2"  (1.88 m)   Wt 113.4 kg   SpO2 97%   BMI 32.10 kg/m   Physical Exam Constitutional:      General: She is not in acute distress.    Appearance: Normal appearance. She is not ill-appearing, toxic-appearing or diaphoretic.  Eyes:     General: No scleral icterus.    Extraocular Movements: Extraocular movements intact.  Cardiovascular:     Rate and Rhythm: Normal rate.     Pulses:  Normal pulses.     Heart sounds: Normal heart sounds.  Abdominal:     Palpations: Abdomen is soft.  Musculoskeletal:        General: Signs of injury (Left fourth digit with nail avulsed from nail bed ) present. No swelling or tenderness. Normal range of motion.     Cervical back: Normal range of motion and neck supple. No rigidity.     Right lower leg: No edema.     Left lower leg: No edema.     Comments: Left fourth digit with nail avulsed from nail bed, no overlying erythema, no signs of infection. Normal strength and sensation of entire digit. Able to move all joints of finger without pain. Radial pulse 2+  Skin:    General: Skin is warm and dry.     Capillary Refill: Capillary refill takes less than 2 seconds.     Coloration: Skin is not pale.  Neurological:     General: No focal deficit present.     Mental Status: She is alert and oriented to person, place, and time.  Psychiatric:        Mood and Affect: Mood normal.        Behavior: Behavior normal.     ED Results / Procedures / Treatments   Labs (all labs ordered are listed, but only abnormal results are displayed) Labs Reviewed - No data to display  EKG None  Radiology No results found.  Procedures .Nerve Block  Date/Time: 09/24/2019 6:43 PM Performed by: Alfredia Client, PA-C Authorized by: Alfredia Client, PA-C   Consent:    Consent obtained:  Verbal   Consent given by:  Patient   Risks discussed:  Infection, nerve damage, swelling, unsuccessful block, pain, intravenous injection and allergic reaction   Alternatives discussed:  No treatment Indications:    Indications:  Pain relief and procedural anesthesia Location:    Body area:  Upper extremity   Upper extremity nerve:  Metacarpal   Laterality:  Left Pre-procedure details:    Skin preparation:  Alcohol Skin anesthesia (see MAR for exact dosages):    Skin anesthesia method:  None Procedure details (see MAR for exact dosages):    Block needle gauge:  24  G   Anesthetic injected:  Lidocaine 2% w/o epi   Injection procedure:  Anatomic landmarks identified, negative aspiration for blood, introduced needle and anatomic landmarks palpated Post-procedure details:    Outcome:  Pain relieved   Patient tolerance of procedure:  Tolerated well, no immediate complications Comments:     Nail dermabonded back on after irrigation and nerve block.    (including critical care time)  Medications Ordered in ED Medications  acetaminophen (TYLENOL) tablet 1,000 mg (1,000 mg Oral Given 09/24/19 1000)    ED Course  I have reviewed the triage  vital signs and the nursing notes.  Pertinent labs & imaging results that were available during my care of the patient were reviewed by me and considered in my medical decision making (see chart for details).    MDM Rules/Calculators/A&P                     Gorman Safi is a 34 y.o. adult (transgender male to male) that presents to the emergency department for nail avulsion on her left fourth digit.  Normal strength, sensation, and movement of digit. No signs of infection. Nerve block preformed and nail dermabonded back on. Pt tolerated without difficulty.  I explained the diagnosis and have given explicit precautions to return to the ER including for any other new (signs of infection) or worsening symptoms. The patient understands and accepts the medical plan as it's been dictated and I have answered their questions. Discharge instructions concerning home care and prescriptions have been given. The patient is STABLE and is discharged to home in good condition. Explained how to take care of nail post dermabond. Pt to follow up with PCP in the next couple of days. Pt agreeable.    Final Clinical Impression(s) / ED Diagnoses Final diagnoses:  Avulsion of fingernail, initial encounter    Rx / DC Orders ED Discharge Orders    None       Farrel Gordon, PA-C 09/26/19 1093    Sabas Sous, MD 09/29/19 [phone]

## 2019-09-24 NOTE — Discharge Instructions (Addendum)
You were seen in the ER today for a nail bed injury. Make sure to try and avoid other injuries to that area. Follow up with PCP as soon as you can.  If you start developing numbness, tingling, weakness in your finger or increasing pain please come back to the emergency department.  Use ibuprofen as needed for your pain.  You can also use ice on that area.  File down your nail as we spoke about.

## 2019-09-24 NOTE — ED Triage Notes (Signed)
Pt states lifting boxes this morning, has artificial nails on, nail got hung on box and real fingernail is now pulled back off of finger, moderately intact.

## 2019-09-24 NOTE — ED Provider Notes (Signed)
34 y/o transgender male to male presenting for evaluation of nailbed injury.  Patient was lifting boxes this morning and one of her artificial nails was caught on a box and was pulled backwards partially avulsing the nail.  On exam the base of the nail is still intact but the distal part of the nail is partially avulsed.  There is no laceration to the nailbed or any ongoing bleeding.  The wound was thoroughly cleansed in soap and water and was also irrigated with normal saline.  Dermabond was placed on the nailbed and the nail was secured to the nailbed.  Patient was advised to have the nail filed down until it it is very short.  She was given a splint and was advised to keep the splint in place for the next 1 to 2 weeks.  She was advised to follow-up with her PCP in regards to her symptoms and return to the ED for new or worsening symptoms.  Case was discussed with Dr. Pilar Plate who is in agreement with plan to Dermabond the finger in splint.   Karrie Meres, PA-C 09/24/19 1752    Sabas Sous, MD 09/25/19 1030

## 2019-09-25 ENCOUNTER — Ambulatory Visit: Payer: Medicare Other

## 2019-10-01 ENCOUNTER — Other Ambulatory Visit: Payer: Self-pay

## 2019-10-01 ENCOUNTER — Ambulatory Visit (INDEPENDENT_AMBULATORY_CARE_PROVIDER_SITE_OTHER): Payer: Medicare Other

## 2019-10-01 DIAGNOSIS — Z789 Other specified health status: Secondary | ICD-10-CM

## 2019-10-01 DIAGNOSIS — F640 Transsexualism: Secondary | ICD-10-CM | POA: Diagnosis not present

## 2019-10-01 MED ORDER — ESTRADIOL CYPIONATE 5 MG/ML IM OIL
5.0000 mg | TOPICAL_OIL | INTRAMUSCULAR | Status: DC
  Administered 2019-10-01: 5 mg via INTRAMUSCULAR

## 2019-10-01 NOTE — Progress Notes (Signed)
Per orders of Dr. Everardo All injection of Depo-Estradiol given today by N.Keyan Folson,LPN. 0.22mL given per instructions. Patient tolerated injection well.

## 2019-10-28 ENCOUNTER — Telehealth: Payer: Self-pay | Admitting: Endocrinology

## 2019-10-28 NOTE — Telephone Encounter (Signed)
Medication Refill Request  Did you call your pharmacy and request this refill first? Yes  . If patient has not contacted pharmacy first, instruct them to do so for future refills.  . Remind them that contacting the pharmacy for their refill is the quickest method to get the refill.  . Refill policy also stated that it will take anywhere between 24-72 hours to receive the refill.    Name of medication? Depo-Estradiol injection needle  Is this a 90 day supply? N/A  Name and location of pharmacy?  [id number] [place], [place] - [address] Phone:  [phone]  Fax:  [phone]

## 2019-10-29 MED ORDER — ESTRADIOL CYPIONATE 5 MG/ML IM OIL: 1.5000 mg | TOPICAL_OIL | INTRAMUSCULAR | 6 refills | Status: DC

## 2019-10-29 NOTE — Telephone Encounter (Signed)
Dr. Elvera Lennox, could you please refill at your earliest convenience in Dr. George Hugh absence?

## 2019-10-29 NOTE — Telephone Encounter (Signed)
Routing it back to you

## 2019-10-29 NOTE — Telephone Encounter (Signed)
Dr. Lonzo Cloud, please refill in Dr. George Hugh absence, if appropriate.

## 2019-11-20 MED FILL — BIKTARVY 50-200-25 MG TABS: 50-200-25 | 30 days supply | Qty: 30 | Fill #3

## 2019-12-03 ENCOUNTER — Ambulatory Visit (INDEPENDENT_AMBULATORY_CARE_PROVIDER_SITE_OTHER): Payer: Medicare Other

## 2019-12-03 ENCOUNTER — Other Ambulatory Visit: Payer: Self-pay

## 2019-12-03 DIAGNOSIS — F640 Transsexualism: Secondary | ICD-10-CM | POA: Diagnosis not present

## 2019-12-03 DIAGNOSIS — Z789 Other specified health status: Secondary | ICD-10-CM

## 2019-12-03 MED ORDER — ESTRADIOL CYPIONATE 5 MG/ML IM OIL
1.5000 mg | TOPICAL_OIL | INTRAMUSCULAR | Status: DC
  Administered 2019-12-03: 1.5 mg via INTRAMUSCULAR

## 2019-12-03 NOTE — Progress Notes (Signed)
Per orders of [place] injection of Depo-Estradiol given today by N.Shyanne Mcclary,LPN. Patient tolerated injection well.

## 2019-12-10 MED FILL — BIKTARVY 50-200-25 MG TABS: 50-200-25 | 30 days supply | Qty: 30 | Fill #3

## 2020-01-01 ENCOUNTER — Ambulatory Visit: Payer: Medicare Other | Admitting: Endocrinology

## 2020-01-17 ENCOUNTER — Ambulatory Visit: Payer: Medicare Other

## 2020-01-30 ENCOUNTER — Ambulatory Visit: Payer: Medicare Other

## 2020-02-06 ENCOUNTER — Ambulatory Visit: Payer: Medicare Other

## 2020-02-07 ENCOUNTER — Ambulatory Visit: Payer: Medicare Other | Admitting: Endocrinology

## 2020-02-13 ENCOUNTER — Ambulatory Visit: Payer: Medicare Other

## 2020-02-17 ENCOUNTER — Telehealth: Payer: Self-pay | Admitting: Endocrinology

## 2020-02-17 ENCOUNTER — Other Ambulatory Visit: Payer: Medicare Other

## 2020-02-17 NOTE — Telephone Encounter (Signed)
Patient has been scheduled for f/u on this Friday 02/21/20

## 2020-02-17 NOTE — Telephone Encounter (Signed)
1.  Please schedule f/u appt 2.  Then please refill x 2 mos, pending that appt.  

## 2020-02-17 NOTE — Telephone Encounter (Signed)
Medication Refill Request  . Did you call your pharmacy and request this refill first?NO  . If patient has not contacted pharmacy first, instruct them to do so for future refills.  . Remind them that contacting the pharmacy for their refill is the quickest method to get the refill.  . Refill policy also stated that it will take anywhere between 24-72 hours to receive the refill.    Name of medication? Syringes   Is this a 90 day supply? unknown  [facility] [address]

## 2020-02-20 ENCOUNTER — Ambulatory Visit: Payer: Medicare Other

## 2020-02-21 ENCOUNTER — Encounter: Payer: Self-pay | Admitting: Endocrinology

## 2020-02-21 ENCOUNTER — Ambulatory Visit (INDEPENDENT_AMBULATORY_CARE_PROVIDER_SITE_OTHER): Payer: Medicare Other | Admitting: Endocrinology

## 2020-02-21 ENCOUNTER — Other Ambulatory Visit: Payer: Self-pay

## 2020-02-21 VITALS — BP 140/88 | HR 75 | Ht 74.0 in | Wt 271.0 lb

## 2020-02-21 DIAGNOSIS — Z789 Other specified health status: Secondary | ICD-10-CM

## 2020-02-21 DIAGNOSIS — F5105 Insomnia due to other mental disorder: Secondary | ICD-10-CM

## 2020-02-21 DIAGNOSIS — I10 Essential (primary) hypertension: Secondary | ICD-10-CM | POA: Diagnosis not present

## 2020-02-21 DIAGNOSIS — F250 Schizoaffective disorder, bipolar type: Secondary | ICD-10-CM | POA: Diagnosis not present

## 2020-02-21 MED ORDER — ESTRADIOL CYPIONATE 5 MG/ML IM OIL
1.5000 mg | TOPICAL_OIL | INTRAMUSCULAR | 6 refills | Status: DC
Start: 2020-02-21 — End: 2020-08-13

## 2020-02-21 MED ORDER — "SYRINGE 18G X 1-1/2"" 3 ML MISC": 0 refills | Status: DC

## 2020-02-21 MED ORDER — "BD SYRINGE/NEEDLE 23G X 1"" 3 ML MISC": 0 refills | Status: DC

## 2020-02-21 MED ORDER — SHARPS CONTAINER MISC: 1.0000 | 0 refills | Status: DC | PRN

## 2020-02-21 MED ORDER — DUTASTERIDE 0.5 MG PO CAPS
0.5000 mg | ORAL_CAPSULE | Freq: Every day | ORAL | 3 refills | Status: DC
Start: 2020-02-21 — End: 2020-04-24

## 2020-02-21 NOTE — Patient Instructions (Addendum)
Your blood pressure is high today.  Please see your primary care provider soon, to have it rechecked I have [facility], for the estrogen injections. Please continue to pursue the surgery.  Please see a urology specialist, at [facility].  I have put the referral in again.  you will receive a phone call, about a day and time for an appointment.  If not, please call [phone].   Please come back for a follow-up appointment in 4 months.

## 2020-02-21 NOTE — Progress Notes (Signed)
Subjective:    Patient ID: Hector [name], adult    DOB: [birth year], 34 y.o.   MRN: [id number]  HPI Pt returns for f/u of transgender state (M to F):  Surgery: never Medication: E2 since 2020 Counseling: never Other: Pt says she went to [facility] to schedule orchiectomy, but they have delayed, due to coronavirus; she prefers to take via injection.  SDOH: she could not afford elagolix.   Interval hx: Pt says very little progress on breast growth. she is still considering the orchiectomy.  she has not recently taken E2, as she says the pharmacy cannot supply the needles Past Medical History:  Diagnosis Date  . Depression   . Headache   . History of fractured vertebra april 8th Diagnosed   Larey Seat out of a tree while climbing it.  . Human immunodeficiency virus I infection (HCC) 2016  . Hypertension   . Immune deficiency disorder (HCC)   . Mental disorder   . Myocardial infarction [facility])     Past Surgical History:  Procedure Laterality Date  . NO PAST SURGERIES      Social History   Socioeconomic History  . Marital status: Single    Spouse name: Not on file  . Number of children: Not on file  . Years of education: Not on file  . Highest education level: Not on file  Occupational History  . Not on file  Tobacco Use  . Smoking status: Never Smoker  . Smokeless tobacco: Never Used  Vaping Use  . Vaping Use: Never used  Substance and Sexual Activity  . Alcohol use: No  . Drug use: No  . Sexual activity: Never    Comment: declined   Other Topics Concern  . Not on file  Social History Narrative  . Not on file   Social Determinants of Health   Financial Resource Strain:   . Difficulty of Paying Living Expenses: Not on file  Food Insecurity:   . Worried About Programme researcher, broadcasting/film/video in the Last Year: Not on file  . Ran Out of Food in the Last Year: Not on file  Transportation Needs:   . Lack of Transportation (Medical): Not on file  . Lack of Transportation (Non-Medical):  Not on file  Physical Activity:   . Days of Exercise per Week: Not on file  . Minutes of Exercise per Session: Not on file  Stress:   . Feeling of Stress : Not on file  Social Connections:   . Frequency of Communication with Friends and Family: Not on file  . Frequency of Social Gatherings with Friends and Family: Not on file  . Attends Religious Services: Not on file  . Active Member of Clubs or Organizations: Not on file  . Attends Banker Meetings: Not on file  . Marital Status: Not on file  Intimate Partner Violence:   . Fear of Current or Ex-Partner: Not on file  . Emotionally Abused: Not on file  . Physically Abused: Not on file  . Sexually Abused: Not on file    Current Outpatient Medications on File Prior to Visit  Medication Sig Dispense Refill  . BIKTARVY 50-200-25 MG TABS tablet TAKE 1 TABLET BY MOUTH DAILY. 30 tablet 5  . Elagolix Sodium 150 MG TABS Take 150 mg by mouth daily. 30 tablet 11   Current Facility-Administered Medications on File Prior to Visit  Medication Dose Route Frequency Provider Last Rate Last Admin  . estradiol cypionate (DEPO-ESTRADIOL) 5 MG/ML injection 1.5  mg  1.5 mg Intramuscular Q28 days Romero Belling, MD   1.5 mg at 12/03/19 1352  . estradiol cypionate (DEPO-ESTRADIOL) 5 MG/ML injection 5 mg  5 mg Intramuscular Q30 days Romero Belling, MD   5 mg at 10/01/19 1118    Allergies  Allergen Reactions  . Tomato Anaphylaxis and Rash  . Risperdal [Risperidone] Nausea And Vomiting  . Aspirin Itching    Family History  Problem Relation Age of Onset  . CAD Mother 66       MI  . Endocrine Disorder Neg Hx     BP 140/88   Pulse 75   Ht 6\' 2"  (1.88 m)   Wt 271 lb (122.9 kg)   SpO2 95%   BMI 34.79 kg/m    Review of Systems Denies sob     Objective:   Physical Exam VITAL SIGNS:  See vs page GENERAL: no distress Ext: no leg edema.        Assessment & Plan:  Transgender state: uncontrolled.  TFT are not checked, as  medicare declines.  Noncompliance with medication.  I'll try sending to a different pharmacy.    Patient Instructions  Your blood pressure is high today.  Please see your primary care provider soon, to have it rechecked I have [facility] a [facility], for the estrogen injections. Please continue to pursue the surgery.  Please see a urology specialist, at [facility].  I have put the referral in again.  you will receive a phone call, about a day and time for an appointment.  If not, please call [phone].   Please come back for a follow-up appointment in 4 months.

## 2020-02-24 ENCOUNTER — Other Ambulatory Visit: Payer: Medicare Other

## 2020-03-06 ENCOUNTER — Encounter: Payer: Medicare Other | Admitting: Infectious Diseases

## 2020-03-06 MED FILL — BIKTARVY 50-200-25 MG TABS: 50-200-25 | 30 days supply | Qty: 30 | Fill #4

## 2020-04-07 IMAGING — CT CT ABDOMEN AND PELVIS WITH CONTRAST
2 of 4 series · 16 of 46 positions shown, 18 images · IV contrast (APPLIED)
Comparison: CT dated 09/16/2016

CLINICAL DATA: Abdominal pain.  Rectal bleeding and headache.

EXAM:
CT ABDOMEN AND PELVIS WITH CONTRAST
TECHNIQUE: Multidetector CT imaging of the abdomen and pelvis was performed
using the standard protocol following bolus administration of
intravenous contrast.
CONTRAST:  100mL OMNIPAQUE IOHEXOL 300 MG/ML  SOLN

[Series 3: abd/ pelvis 5.0 i30f 2 · axial · 0.98mm/px · z∈[+874,+1390]mm · 13 of 113 slices shown, 15 images]
[im 5/113  soft-tissue]
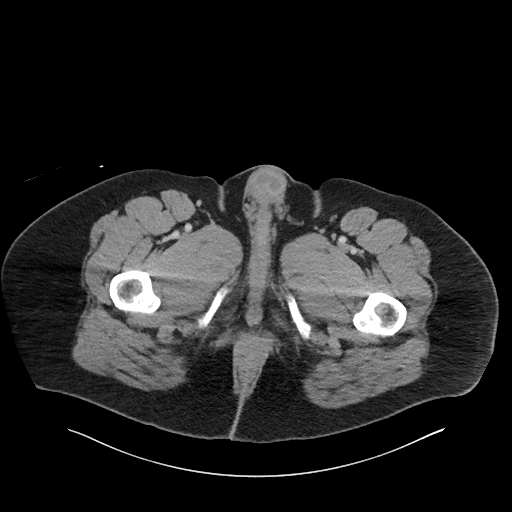
[im 5/113  bone]
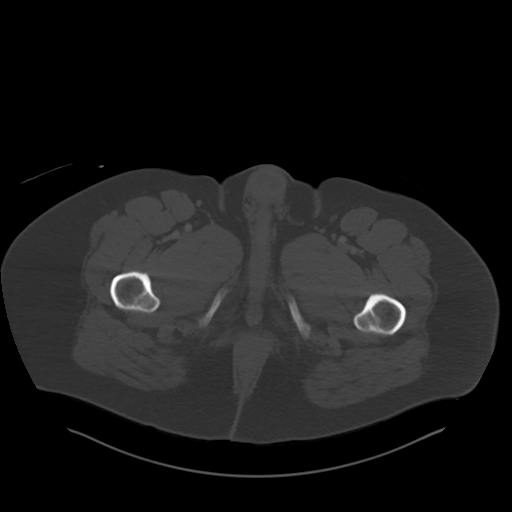
[im 15/113  soft-tissue]
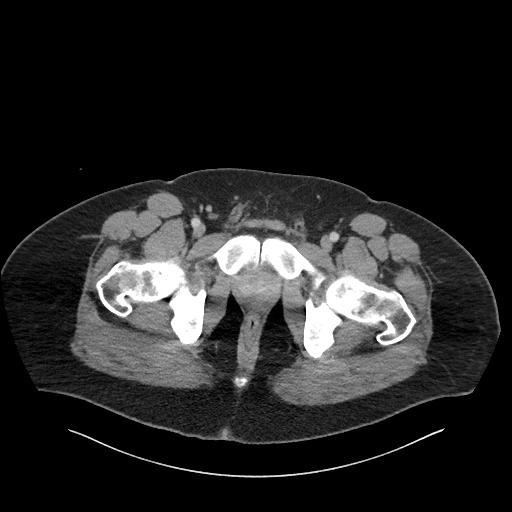
[im 24/113  soft-tissue]
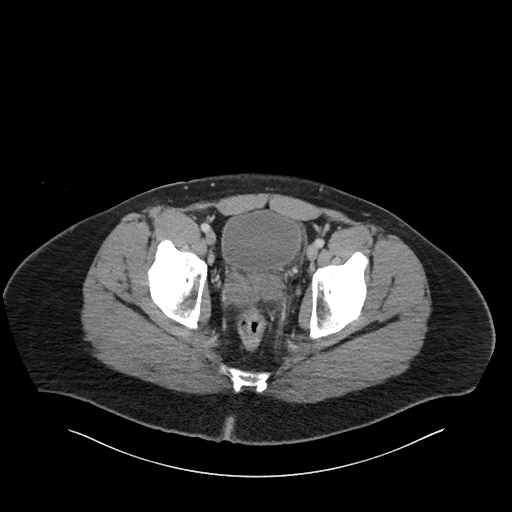
[im 33/113  soft-tissue]
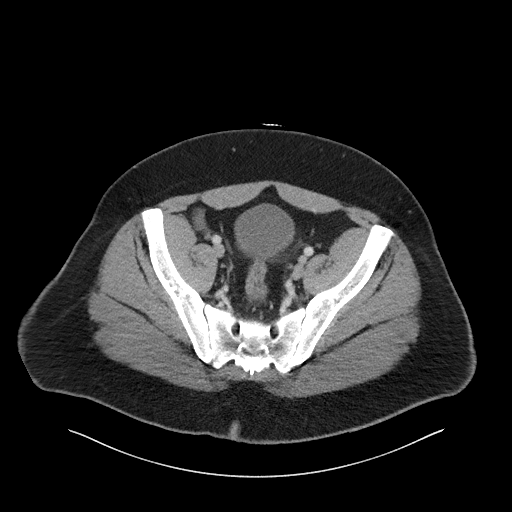
[im 38/113  soft-tissue]
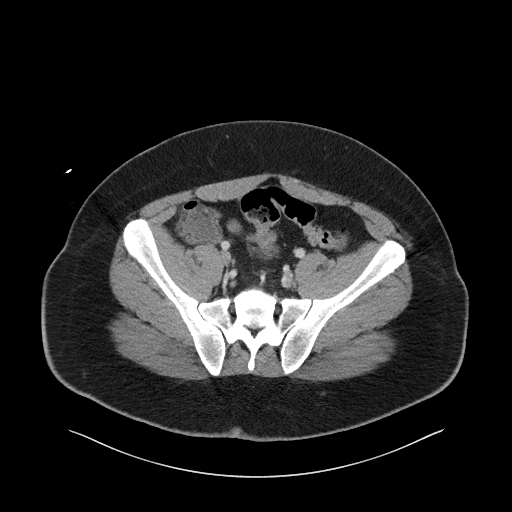
[im 47/113  soft-tissue]
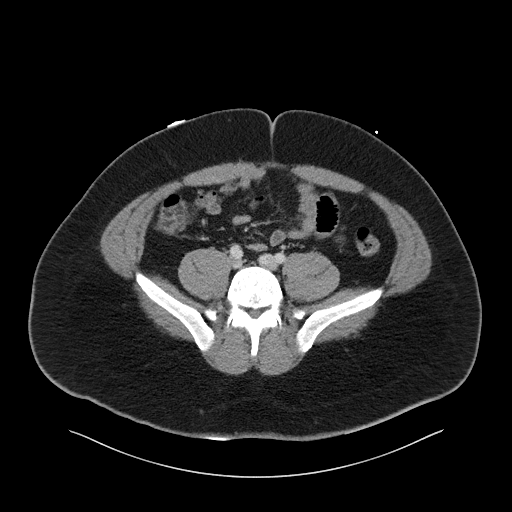
[im 57/113  soft-tissue]
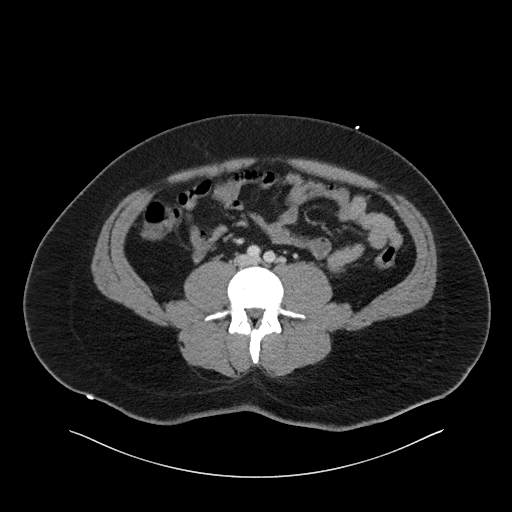
[im 66/113  soft-tissue]
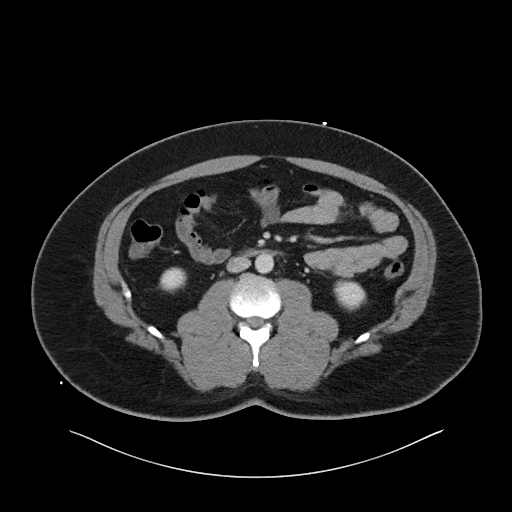
[im 75/113  soft-tissue]
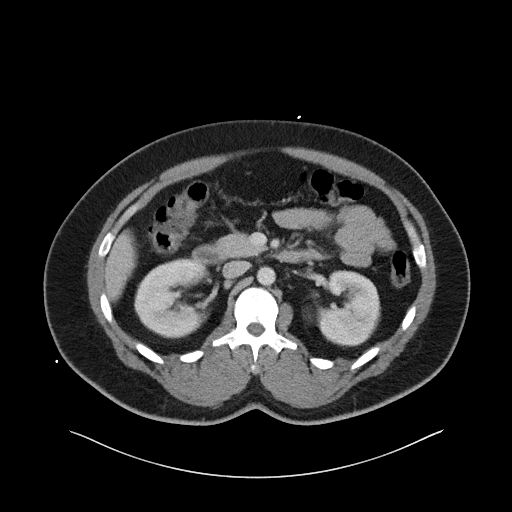
[im 75/113  bone]
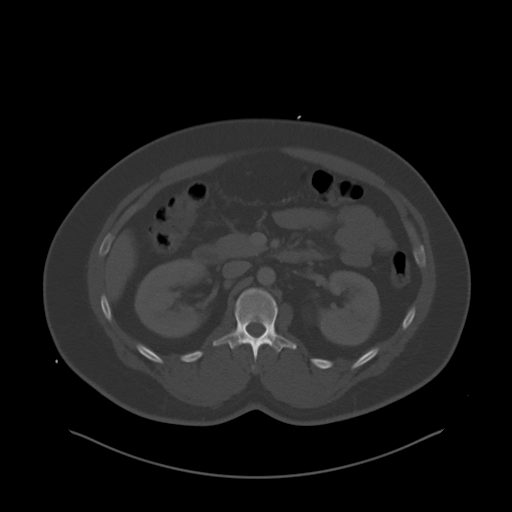
[im 80/113  soft-tissue]
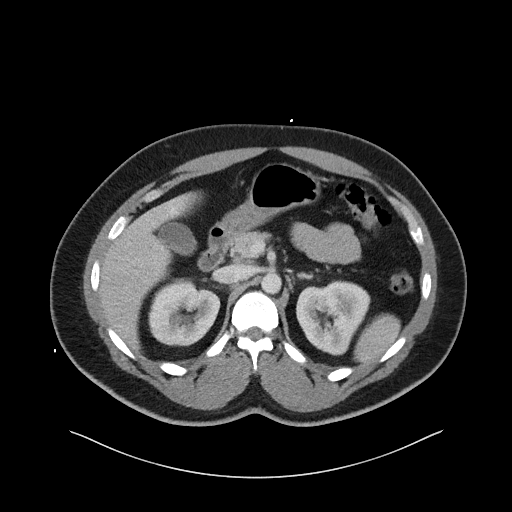
[im 89/113  soft-tissue]
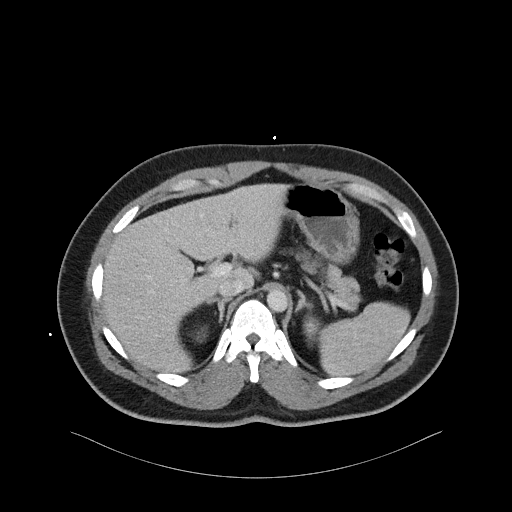
[im 99/113  soft-tissue]
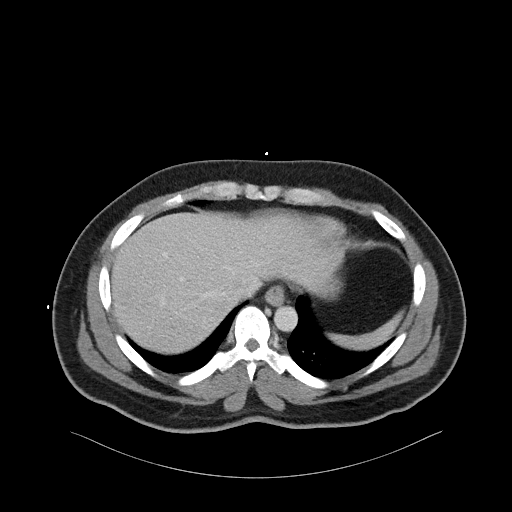
[im 108/113  soft-tissue]
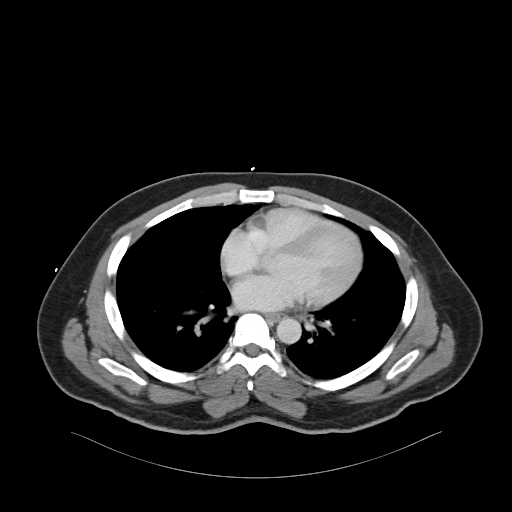

[Series 6: coronal soft tissue · coronal · 1.10mm/px · 3 of 140 slices shown]
[im 47/140  soft-tissue]
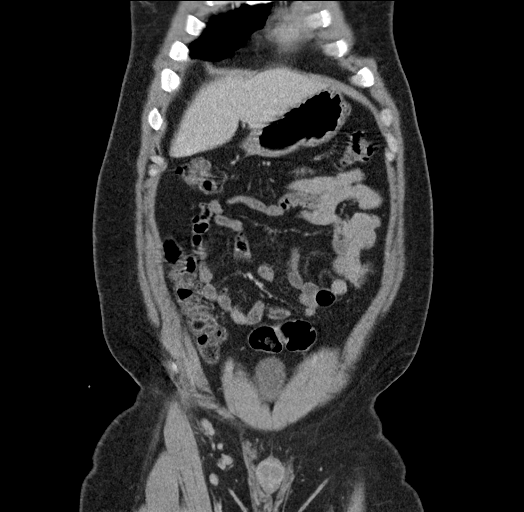
[im 62/140  soft-tissue]
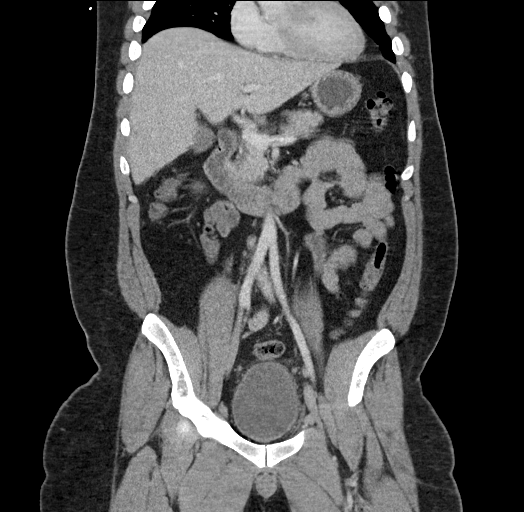
[im 78/140  soft-tissue]
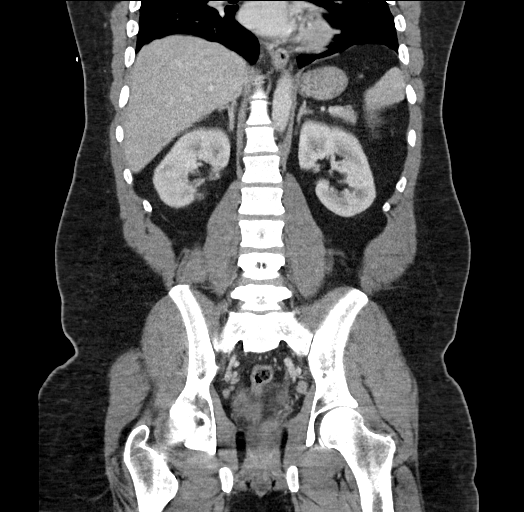

[16 of 46 positions shown; findings below may reference images not displayed]

FINDINGS: Lower chest: The lung bases are clear. The heart size is normal.

Hepatobiliary: The liver is normal. Normal gallbladder.There is no
biliary ductal dilation.

Pancreas: Normal contours without ductal dilatation. No
peripancreatic fluid collection.

Spleen: No splenic laceration or hematoma.

Adrenals/Urinary Tract:

--Adrenal glands: No adrenal hemorrhage.

--Right kidney/ureter: No hydronephrosis or perinephric hematoma.

--Left kidney/ureter: No hydronephrosis or perinephric hematoma.

--Urinary bladder: There are inflammatory changes surrounding the
urinary bladder with a trace amount of free fluid in the patient's
pelvis.

Stomach/Bowel:

--Stomach/Duodenum: No hiatal hernia or other gastric abnormality.
Normal duodenal course and caliber.

--Small bowel: No dilatation or inflammation.

--Colon: No focal abnormality.

--Appendix: Normal.

Vascular/Lymphatic: Normal course and caliber of the major abdominal
vessels.

--there are some mildly enlarged but subcentimeter retroperitoneal
lymph nodes.

--No mesenteric lymphadenopathy.

--there are multiple enlarged pelvic lymph nodes and bilateral
inguinal lymph nodes. For example there is a left pelvic sidewall
lymph node measuring approximately 1 cm in the short axis. This
however is stable from prior CT in 4660.

Reproductive: The prostate gland is mildly enlarged. The seminal
vesicles appeared large. There is some prominence of the right
inguinal canal vasculature.

Other: There is no free air. There is a trace amount of free fluid
in the patient's pelvis. The abdominal wall is normal.

Musculoskeletal. No acute displaced fractures.
IMPRESSION: Inflammatory process in the pelvis as detailed above. Differential
considerations include a cystitis, prostatitis, or other infectious
inflammatory process such as urethritis or epididymitis. There is
some enlargement of the seminal vesicles which appears stable across
prior studies. Seminal vesiculitis is a consideration but is
relatively rare. There are multiple enlarged pelvic and inguinal
lymph nodes which are presumably reactive and are stable when
compared to CT in 4660. The prostate gland is mildly enlarged.

## 2020-04-08 ENCOUNTER — Telehealth: Payer: Self-pay

## 2020-04-08 NOTE — Telephone Encounter (Signed)
[facility] Patient Advocate Encounter  [facility] and I have been unsuccsessful in reaching patient to be able to refill medication.    We have tried multiple times without a response.  Milisa Kimbell, CPhT Specialty Pharmacy Patient [facility] Phone: [phone] Fax:  [phone]  

## 2020-04-10 ENCOUNTER — Other Ambulatory Visit: Payer: Medicare Other

## 2020-04-24 ENCOUNTER — Encounter: Payer: Self-pay | Admitting: Infectious Diseases

## 2020-04-24 ENCOUNTER — Ambulatory Visit (INDEPENDENT_AMBULATORY_CARE_PROVIDER_SITE_OTHER): Payer: Medicare Other | Admitting: Infectious Diseases

## 2020-04-24 ENCOUNTER — Other Ambulatory Visit: Payer: Self-pay

## 2020-04-24 VITALS — BP 137/87 | HR 77 | Temp 98.2°F | Resp 16 | Ht 74.0 in | Wt 291.0 lb

## 2020-04-24 DIAGNOSIS — Z79899 Other long term (current) drug therapy: Secondary | ICD-10-CM

## 2020-04-24 DIAGNOSIS — Z23 Encounter for immunization: Secondary | ICD-10-CM

## 2020-04-24 DIAGNOSIS — Z113 Encounter for screening for infections with a predominantly sexual mode of transmission: Secondary | ICD-10-CM

## 2020-04-24 DIAGNOSIS — B20 Human immunodeficiency virus [HIV] disease: Secondary | ICD-10-CM | POA: Diagnosis not present

## 2020-04-24 DIAGNOSIS — Z789 Other specified health status: Secondary | ICD-10-CM

## 2020-04-24 DIAGNOSIS — I10 Essential (primary) hypertension: Secondary | ICD-10-CM | POA: Diagnosis not present

## 2020-04-24 MED ORDER — BIKTARVY 50-200-25 MG PO TABS: 1.0000 | ORAL_TABLET | Freq: Every day | ORAL | 3 refills | Status: DC

## 2020-04-24 NOTE — Progress Notes (Signed)
   Subjective:    Patient ID: Hector [name], adult    DOB: 28-Nov-1985, 34 y.o.   MRN: [id number]  HPI 34yo M-->Ffirst dx with HIV 2016. Was prev being cared for in [place]. Was prev on DRVr/TRV. Was seen at [facility] 07-2016 and started on triumeq after a med lapse.  S/he was restarted on biktarvy 05-2018 after another med lapse.  Denies missed doses.   Has seen endo for transgender issues.Has gained weight which is attributed to depo shots.  Has been feeling well. Got married. Partner is on PREP, HIV-  Has gotten COVID vax. And flu vax.   HIV 1 RNA Quant (copies/mL)  Date Value  08/30/2018 39 (H)  06/08/2018 1,420 (H)  03/28/2017 31,600 (H)   CD4 T Cell Abs (/uL)  Date Value  08/30/2018 650  06/08/2018 440  03/28/2017 610    Review of Systems  Constitutional: Positive for unexpected weight change. Negative for appetite change.  Respiratory: Negative for cough and shortness of breath.   Gastrointestinal: Negative for constipation and diarrhea.  Genitourinary: Positive for frequency. Negative for difficulty urinating.  Psychiatric/Behavioral: Positive for sleep disturbance (due to energy drinks).       Objective:   Physical Exam        Assessment & Plan:

## 2020-04-24 NOTE — Assessment & Plan Note (Signed)
Doing well Continue biktarvy Check labs today Spouse/Partner on prep.  vax up to date except PCV 23 rtc in 9 months with labs prior.

## 2020-04-24 NOTE — Addendum Note (Signed)
Addended by: Clayborne Artist A on: 04/24/2020 11:52 AM   Modules accepted: Orders

## 2020-04-24 NOTE — Assessment & Plan Note (Signed)
Well controlled today Off rx

## 2020-04-24 NOTE — Assessment & Plan Note (Signed)
Appreciate Endo assistance with hormone rx.

## 2020-04-28 LAB — COMPREHENSIVE METABOLIC PANEL
AG Ratio: 1.1 (calc) (ref 1.0–2.5)
ALT: 31 U/L (ref 9–46)
AST: 21 U/L (ref 10–40)
Albumin: 4.1 g/dL (ref 3.6–5.1)
Alkaline phosphatase (APISO): 69 U/L (ref 36–130)
BUN: 10 mg/dL (ref 7–25)
CO2: 26 mmol/L (ref 20–32)
Calcium: 9.2 mg/dL (ref 8.6–10.3)
Chloride: 105 mmol/L (ref 98–110)
Creat: 0.72 mg/dL (ref 0.60–1.35)
Globulin: 3.8 g/dL (calc) — ABNORMAL HIGH (ref 1.9–3.7)
Glucose, Bld: 88 mg/dL (ref 65–99)
Potassium: 4.2 mmol/L (ref 3.5–5.3)
Sodium: 139 mmol/L (ref 135–146)
Total Bilirubin: 0.4 mg/dL (ref 0.2–1.2)
Total Protein: 7.9 g/dL (ref 6.1–8.1)

## 2020-04-28 LAB — HIV-1 RNA QUANT-NO REFLEX-BLD
HIV 1 RNA Quant: <20 Copies/mL
HIV-1 RNA Quant, Log: <1.3 Log cps/mL

## 2020-04-28 LAB — CBC
HCT: 41.3 % (ref 38.5–50.0)
Hemoglobin: 14.1 g/dL (ref 13.2–17.1)
MCH: 30 pg (ref 27.0–33.0)
MCHC: 34.1 g/dL (ref 32.0–36.0)
MCV: 87.9 fL (ref 80.0–100.0)
MPV: 10.9 fL (ref 7.5–12.5)
Platelets: 226 10*3/uL (ref 140–400)
RBC: 4.7 10*6/uL (ref 4.20–5.80)
RDW: 13.1 % (ref 11.0–15.0)
WBC: 8.3 10*3/uL (ref 3.8–10.8)

## 2020-04-28 LAB — RPR: RPR Ser Ql: NONREACTIVE

## 2020-05-12 MED FILL — BIKTARVY 50-200-25 MG TABS: 50-200-25 | 30 days supply | Qty: 30 | Fill #5

## 2020-05-26 ENCOUNTER — Ambulatory Visit (HOSPITAL_COMMUNITY)
Admission: EM | Admit: 2020-05-26 | Discharge: 2020-05-26 | Discharge status: Against Medical Advice | Payer: Medicare Other

## 2020-05-26 ENCOUNTER — Other Ambulatory Visit: Payer: Self-pay

## 2020-06-04 ENCOUNTER — Ambulatory Visit: Payer: Medicare Other | Attending: Internal Medicine

## 2020-06-04 DIAGNOSIS — Z23 Encounter for immunization: Secondary | ICD-10-CM

## 2020-06-04 NOTE — Progress Notes (Signed)
   Covid-19 Vaccination Clinic  Name:  Hector [name]    MRN: [id number] DOB: 1985-12-14  06/04/2020  [name] was observed post Covid-19 immunization for 15 minutes without incident. She was provided with Vaccine Information Sheet and instruction to access the V-Safe system.   [name] was instructed to call 911 with any severe reactions post vaccine: Marland Kitchen Difficulty breathing  . Swelling of face and throat  . A fast heartbeat  . A bad rash all over body  . Dizziness and weakness   Immunizations Administered    Name Date Dose VIS Date Route   Pfizer COVID-19 Vaccine 06/04/2020  1:17 PM 0.3 mL 03/11/2020 Intramuscular   Manufacturer: [employer], [employer]   Lot: [id number]   NDC: [address]-8

## 2020-07-14 ENCOUNTER — Other Ambulatory Visit: Payer: Self-pay

## 2020-07-14 ENCOUNTER — Encounter (HOSPITAL_COMMUNITY): Payer: Self-pay | Admitting: Emergency Medicine

## 2020-07-14 ENCOUNTER — Ambulatory Visit (HOSPITAL_COMMUNITY)
Admission: EM | Admit: 2020-07-14 | Discharge: 2020-07-14 | Disposition: A | Discharge status: Home / Self Care | Payer: Medicare Other | Attending: Family Medicine | Admitting: Family Medicine

## 2020-07-14 DIAGNOSIS — R519 Headache, unspecified: Secondary | ICD-10-CM

## 2020-07-14 DIAGNOSIS — I10 Essential (primary) hypertension: Secondary | ICD-10-CM

## 2020-07-14 MED ORDER — KETOROLAC TROMETHAMINE 60 MG/2ML IM SOLN
60.0000 mg | Freq: Once | INTRAMUSCULAR | Status: AC
  Administered 2020-07-14: 60 mg via INTRAMUSCULAR

## 2020-07-14 MED ORDER — KETOROLAC TROMETHAMINE 60 MG/2ML IM SOLN
INTRAMUSCULAR | Status: AC
  Filled 2020-07-14: qty 2

## 2020-07-14 NOTE — ED Notes (Signed)
BP taken manually

## 2020-07-14 NOTE — Discharge Instructions (Signed)
Your blood pressure was noted to be elevated during your visit today. If you are currently taking medication for high blood pressure, please ensure you are taking this as directed. If you do not have a history of high blood pressure and your blood pressure remains persistently elevated, you may need to begin taking a medication at some point. You may return here within the next few days to recheck if unable to see your primary care provider or if you do not have a one.  BP (!) 160/90 (BP Location: Right Arm)   Pulse 87   Temp 98.1 F (36.7 C) (Oral)   Resp 18   SpO2 95%   BP Readings from Last 3 Encounters:  07/14/20 (!) 160/90  04/24/20 137/87  02/21/20 140/88

## 2020-07-14 NOTE — ED Notes (Signed)
Check BP on Dinamap 3x, unsuccessful. Dinamap not reading BP.

## 2020-07-14 NOTE — ED Provider Notes (Signed)
[facility] CARE CENTER   [id number] 07/14/20 Arrival Time: 1642  ASSESSMENT & PLAN:  1. Bad headache   2. Elevated blood pressure reading in office with diagnosis of hypertension     Meds ordered this encounter  Medications  . ketorolac (TORADOL) injection 60 mg   Normal neurological exam. Afebrile without nuchal rigidity. Discussed. Current presentation and symptoms are consistent with prior headaches and are not consistent with [facility] or ICH. No indication for neurodiagnostic workup at this time.     Discharge Instructions      Your blood pressure was noted to be elevated during your visit today. If you are currently taking medication for high blood pressure, please ensure you are taking this as directed. If you do not have a history of high blood pressure and your blood pressure remains persistently elevated, you may need to begin taking a medication at some point. You may return here within the next few days to recheck if unable to see your primary care provider or if you do not have a one.  BP (!) 160/90 (BP Location: Right Arm)   Pulse 87   Temp 98.1 F (36.7 C) (Oral)   Resp 18   SpO2 95%   BP Readings from Last 3 Encounters:  07/14/20 (!) 160/90  04/24/20 137/87  02/21/20 140/88         Recommend:  Follow-up Information    [facility] EMERGENCY DEPARTMENT.   Specialty: Emergency Medicine Why: If symptoms worsen in any way. Contact information: [id number]c [place] [address] [phone]               Reviewed expectations re: course of current medical issues. Questions answered. Outlined signs and symptoms indicating need for more acute intervention. Patient verbalized understanding. After Visit Summary given.   SUBJECTIVE: History from: Patient Patient is able to give a clear and coherent history.  Hector [name] is a 35 y.o. adult who presents with complaint of a bad headache. Reports  h/o migraines. Onset gradual, today. Location: frontal without radiation. History of headaches: yes; same symptoms. Precipitating factors include: none which have been determined. Associated symptoms: Preceding aura: no. Nausea/vomiting: no. Vision changes: no. Increased sensitivity to light and to noises: no. Fever: no. Sinus pressure/congestion: no. Extremity weakness: no. Home treatment has included Tylenol with little improvement. Current headache has limited normal daily activities. Missed work. Denies numbness of extremities, speech difficulties, vision problems and vomiting in the early morning. No head injury reported. Ambulatory without difficulty. No recent travel.  Increased blood pressure noted today. Reports that she is not currently treated for HTN.  She reports no chest pain on exertion, no dyspnea on exertion, no swelling of ankles, no orthostatic dizziness or lightheadedness, no orthopnea or paroxysmal nocturnal dyspnea, no palpitations and no intermittent claudication symptoms.    OBJECTIVE:  Vitals:   07/14/20 1740 07/14/20 1805  BP:  (!) 160/90  Pulse: 87   Resp: 18   Temp: 98.1 F (36.7 C)   TempSrc: Oral   SpO2: 95%     General appearance: alert; NAD HENT: normocephalic; atraumatic Eyes: PERRLA; EOMI; conjunctivae normal Neck: supple with FROM Lungs: clear to auscultation bilaterally; unlabored respirations Heart: regular  Extremities: no edema; symmetrical with no gross deformities Skin: warm and dry Neurologic: alert; speech is fluent and clear without dysarthria or aphasia; CN 2-12 grossly intact; no facial droop; normal gait Psychological: alert and cooperative; normal mood and affect   Allergies  Allergen Reactions  .  Tomato Anaphylaxis and Rash  . Risperdal [Risperidone] Nausea And Vomiting  . Aspirin Itching    Past Medical History:  Diagnosis Date  . Depression   . Headache   . History of fractured vertebra april 8th Diagnosed    Larey Seat out of a tree while climbing it.  . Human immunodeficiency virus I infection (HCC) 2016  . Hypertension   . Immune deficiency disorder (HCC)   . Mental disorder   . Myocardial infarction [facility])    Social History   Socioeconomic History  . Marital status: Single    Spouse name: Not on file  . Number of children: Not on file  . Years of education: Not on file  . Highest education level: Not on file  Occupational History  . Not on file  Tobacco Use  . Smoking status: Never Smoker  . Smokeless tobacco: Never Used  Vaping Use  . Vaping Use: Never used  Substance and Sexual Activity  . Alcohol use: No  . Drug use: No  . Sexual activity: Never    Comment: declined   Other Topics Concern  . Not on file  Social History Narrative  . Not on file   Social Determinants of Health   Financial Resource Strain: Not on file  Food Insecurity: Not on file  Transportation Needs: Not on file  Physical Activity: Not on file  Stress: Not on file  Social Connections: Not on file  Intimate Partner Violence: Not on file   Family History  Problem Relation Age of Onset  . CAD Mother 93       MI  . Endocrine Disorder Neg Hx    Past Surgical History:  Procedure Laterality Date  . NO PAST SURGERIES       Mardella Layman, MD 07/14/20 Rickey Primus

## 2020-07-14 NOTE — ED Triage Notes (Signed)
Pt presents today with c/o of headache since last night, denies n/v/d.

## 2020-08-13 ENCOUNTER — Telehealth: Payer: Self-pay | Admitting: Endocrinology

## 2020-08-13 ENCOUNTER — Other Ambulatory Visit: Payer: Self-pay | Admitting: Infectious Diseases

## 2020-08-13 ENCOUNTER — Other Ambulatory Visit: Payer: Self-pay

## 2020-08-13 DIAGNOSIS — B20 Human immunodeficiency virus [HIV] disease: Secondary | ICD-10-CM

## 2020-08-13 MED ORDER — BIKTARVY 50-200-25 MG PO TABS: 1.0000 | ORAL_TABLET | Freq: Every day | ORAL | 3 refills | Status: DC

## 2020-08-13 MED ORDER — ESTRADIOL CYPIONATE 5 MG/ML IM OIL
1.5000 mg | TOPICAL_OIL | INTRAMUSCULAR | 0 refills | Status: DC
Start: 2020-08-13 — End: 2020-10-08

## 2020-08-13 MED FILL — BIKTARVY 50-200-25 MG TABS: 50-200-25 | 30 days supply | Qty: 30 | Fill #0

## 2020-08-13 NOTE — Telephone Encounter (Signed)
MEDICATION: estradiol   PHARMACY:   [id number] [place],  - [address] Phone:  [phone]  Fax:  [phone]      HAS THE PATIENT CONTACTED THEIR PHARMACY?  no  IS THIS A 90 DAY SUPPLY : yes  IS PATIENT OUT OF MEDICATION: yes  IF NOT; HOW MUCH IS LEFT:   LAST APPOINTMENT DATE: @10 /05/2019  NEXT APPOINTMENT DATE:@3 /30/2022  DO WE HAVE YOUR PERMISSION TO LEAVE A DETAILED MESSAGE?:  OTHER COMMENTS:  Pt requests a higher dose, states there has been no results and she thinks it isn't strong enough.  Ph# [phone] --------------------------------------------------------------------------------------- **Let patient know to contact pharmacy at the end of the day to make sure medication is ready. **  ** Please notify patient to allow 48-72 hours to process**  **Encourage patient to contact the pharmacy for refills or they can request refills through [facility]**

## 2020-08-13 NOTE — Telephone Encounter (Signed)
#  0 day supply sent until OV.

## 2020-08-19 ENCOUNTER — Other Ambulatory Visit: Payer: Medicare Other

## 2020-08-20 ENCOUNTER — Other Ambulatory Visit: Payer: Self-pay

## 2020-08-20 ENCOUNTER — Other Ambulatory Visit (INDEPENDENT_AMBULATORY_CARE_PROVIDER_SITE_OTHER): Payer: Medicare Other

## 2020-08-20 ENCOUNTER — Other Ambulatory Visit (HOSPITAL_COMMUNITY): Payer: Self-pay

## 2020-08-20 DIAGNOSIS — Z789 Other specified health status: Secondary | ICD-10-CM | POA: Diagnosis not present

## 2020-08-20 DIAGNOSIS — I10 Essential (primary) hypertension: Secondary | ICD-10-CM

## 2020-08-20 LAB — CBC WITH DIFFERENTIAL/PLATELET
Basophils Absolute: 0.1 10*3/uL (ref 0.0–0.1)
Basophils Relative: 1.3 % (ref 0.0–3.0)
Eosinophils Absolute: 0.4 10*3/uL (ref 0.0–0.7)
Eosinophils Relative: 5.1 % — ABNORMAL HIGH (ref 0.0–5.0)
HCT: 39 % (ref 39.0–52.0)
Hemoglobin: 13.6 g/dL (ref 13.0–17.0)
Lymphocytes Relative: 27.2 % (ref 12.0–46.0)
Lymphs Abs: 2 10*3/uL (ref 0.7–4.0)
MCHC: 34.9 g/dL (ref 30.0–36.0)
MCV: 87.6 fl (ref 78.0–100.0)
Monocytes Absolute: 0.5 10*3/uL (ref 0.1–1.0)
Monocytes Relative: 6.7 % (ref 3.0–12.0)
Neutro Abs: 4.3 10*3/uL (ref 1.4–7.7)
Neutrophils Relative %: 59.7 % (ref 43.0–77.0)
Platelets: 229 10*3/uL (ref 150.0–400.0)
RBC: 4.45 Mil/uL (ref 4.22–5.81)
RDW: 12.5 % (ref 11.5–15.5)
WBC: 7.2 10*3/uL (ref 4.0–10.5)

## 2020-08-21 ENCOUNTER — Ambulatory Visit: Payer: Medicare Other | Admitting: Endocrinology

## 2020-08-26 DIAGNOSIS — J069 Acute upper respiratory infection, unspecified: Secondary | ICD-10-CM | POA: Diagnosis not present

## 2020-08-26 DIAGNOSIS — I10 Essential (primary) hypertension: Secondary | ICD-10-CM | POA: Diagnosis not present

## 2020-08-26 DIAGNOSIS — B20 Human immunodeficiency virus [HIV] disease: Secondary | ICD-10-CM | POA: Diagnosis not present

## 2020-08-26 DIAGNOSIS — J209 Acute bronchitis, unspecified: Secondary | ICD-10-CM | POA: Diagnosis not present

## 2020-08-27 LAB — ESTRADIOL, FREE
Estradiol, Free: 1.27 pg/mL — ABNORMAL HIGH
Estradiol: 67 pg/mL — ABNORMAL HIGH (ref <=29)

## 2020-09-01 ENCOUNTER — Other Ambulatory Visit (HOSPITAL_COMMUNITY): Payer: Self-pay

## 2020-09-11 ENCOUNTER — Other Ambulatory Visit (HOSPITAL_COMMUNITY): Payer: Self-pay

## 2020-09-11 MED FILL — Bictegravir-Emtricitabine-Tenofovir AF Tab 50-200-25 MG: ORAL | 30 days supply | Qty: 30 | Fill #0 | Status: AC

## 2020-09-14 ENCOUNTER — Other Ambulatory Visit (HOSPITAL_COMMUNITY): Payer: Self-pay

## 2020-10-05 ENCOUNTER — Ambulatory Visit: Payer: Medicare Other | Admitting: Endocrinology

## 2020-10-06 ENCOUNTER — Other Ambulatory Visit (HOSPITAL_COMMUNITY): Payer: Self-pay

## 2020-10-06 MED FILL — Bictegravir-Emtricitabine-Tenofovir AF Tab 50-200-25 MG: ORAL | 30 days supply | Qty: 30 | Fill #1 | Status: AC

## 2020-10-08 ENCOUNTER — Other Ambulatory Visit: Payer: Self-pay

## 2020-10-08 ENCOUNTER — Ambulatory Visit (INDEPENDENT_AMBULATORY_CARE_PROVIDER_SITE_OTHER): Payer: Medicare Other | Admitting: Endocrinology

## 2020-10-08 ENCOUNTER — Other Ambulatory Visit (HOSPITAL_COMMUNITY): Payer: Self-pay

## 2020-10-08 VITALS — BP 140/80 | HR 60 | Ht 74.0 in | Wt 278.8 lb

## 2020-10-08 DIAGNOSIS — F250 Schizoaffective disorder, bipolar type: Secondary | ICD-10-CM

## 2020-10-08 DIAGNOSIS — F5105 Insomnia due to other mental disorder: Secondary | ICD-10-CM

## 2020-10-08 DIAGNOSIS — Z789 Other specified health status: Secondary | ICD-10-CM | POA: Diagnosis not present

## 2020-10-08 DIAGNOSIS — I10 Essential (primary) hypertension: Secondary | ICD-10-CM | POA: Diagnosis not present

## 2020-10-08 LAB — T4, FREE: Free T4: 0.69 ng/dL (ref 0.60–1.60)

## 2020-10-08 LAB — TSH: TSH: 1.16 u[IU]/mL (ref 0.35–4.50)

## 2020-10-08 MED ORDER — DUTASTERIDE 0.5 MG PO CAPS: 0.5000 mg | ORAL_CAPSULE | Freq: Every day | ORAL | 5 refills | Status: DC

## 2020-10-08 NOTE — Progress Notes (Signed)
Subjective:    Patient ID: Hector [name], adult    DOB: 02-07-1986, 35 y.o.   MRN: [id number]  HPI Pt returns for f/u of transgender state (M to F):  Surgery: never Medication: E2 since 2020, and dulasteride since 2021.   Counseling: never Other: Pt says she went to [facility] to schedule orchiectomy, but they have delayed, due to coronavirus; she prefers to take via injection.   SDOH: she could not afford elagolix.   Interval hx: she is still considering the orchiectomy.  Last dose was 09/17/20.  Since on E2 injections, she reports little improvement in breast growth.  Facial hair is less now.  She takes meds as rx'ed.  Pt says she has lost her supply of E2.    Past Medical History:  Diagnosis Date  . Depression   . Headache   . History of fractured vertebra april 8th Diagnosed   Larey Seat out of a tree while climbing it.  . Human immunodeficiency virus I infection (HCC) 2016  . Hypertension   . Immune deficiency disorder (HCC)   . Mental disorder   . Myocardial infarction [facility])     Past Surgical History:  Procedure Laterality Date  . NO PAST SURGERIES      Social History   Socioeconomic History  . Marital status: Single    Spouse name: Not on file  . Number of children: Not on file  . Years of education: Not on file  . Highest education level: Not on file  Occupational History  . Not on file  Tobacco Use  . Smoking status: Never Smoker  . Smokeless tobacco: Never Used  Vaping Use  . Vaping Use: Never used  Substance and Sexual Activity  . Alcohol use: No  . Drug use: No  . Sexual activity: Never    Comment: declined   Other Topics Concern  . Not on file  Social History Narrative  . Not on file   Social Determinants of Health   Financial Resource Strain: Not on file  Food Insecurity: Not on file  Transportation Needs: Not on file  Physical Activity: Not on file  Stress: Not on file  Social Connections: Not on file  Intimate Partner Violence: Not on file     Current Outpatient Medications on File Prior to Visit  Medication Sig Dispense Refill  . bictegravir-emtricitabine-tenofovir AF (BIKTARVY) 50-200-25 MG TABS tablet TAKE ONE TABLET BY MOUTH ONCE DAILY 90 tablet 5  . sharps container 1 each by Does not apply route as needed. 3 each 0  . SYRINGE-NEEDLE, DISP, 3 ML (B-D SYRINGE/NEEDLE 3CC/23GX1") 23G X 1" 3 ML MISC Inject monthly 3 each 0  . Syringe/Needle, Disp, (SYRINGE 3CC/18GX1-1/2") 18G X 1-1/2" 3 ML MISC Inject monthly 3 each 0   Current Facility-Administered Medications on File Prior to Visit  Medication Dose Route Frequency Provider Last Rate Last Admin  . estradiol cypionate (DEPO-ESTRADIOL) 5 MG/ML injection 1.5 mg  1.5 mg Intramuscular Q28 days Romero Belling, MD   1.5 mg at 12/03/19 1352  . estradiol cypionate (DEPO-ESTRADIOL) 5 MG/ML injection 5 mg  5 mg Intramuscular Q30 days Romero Belling, MD   5 mg at 10/01/19 1118    Allergies  Allergen Reactions  . Tomato Anaphylaxis and Rash  . Risperdal [Risperidone] Nausea And Vomiting  . Aspirin Itching    Family History  Problem Relation Age of Onset  . CAD Mother 46       MI  . Endocrine Disorder Neg Hx  BP 140/80 (BP Location: Right Arm, Patient Position: Sitting, Cuff Size: Large)   Pulse 60   Ht 6\' 2"  (1.88 m)   Wt 278 lb 12.8 oz (126.5 kg)   SpO2 94%   BMI 35.80 kg/m    Review of Systems Denies sob    Objective:   Physical Exam VITAL SIGNS:  See vs page.   GENERAL: no distress.   EXT: no leg edema.   Lab Results  Component Value Date   WBC 7.2 08/20/2020   HGB 13.6 08/20/2020   HCT 39.0 08/20/2020   MCV 87.6 08/20/2020   PLT 229.0 08/20/2020       Assessment & Plan:  Transgender state: uncontrolled.  Check E2,  Please continue the same dulasteride.    Patient Instructions  Please continue to pursue the surgery.   Please see a surgery specialist, at [facility].  please call [phone].   Please come back for a follow-up appointment in 6 months.

## 2020-10-08 NOTE — Patient Instructions (Addendum)
Please continue to pursue the surgery.   Please see a surgery specialist, at [facility].  please call [phone].   Please come back for a follow-up appointment in 6 months.

## 2020-10-10 LAB — TESTOSTERONE,FREE AND TOTAL
Testosterone, Free: 6.3 pg/mL — ABNORMAL LOW (ref 8.7–25.1)
Testosterone: 316 ng/dL (ref 264–916)

## 2020-10-17 LAB — ESTRADIOL, FREE
Estradiol, Free: 0.79 pg/mL — ABNORMAL HIGH
Estradiol: 45 pg/mL — ABNORMAL HIGH (ref <=29)

## 2020-10-22 ENCOUNTER — Telehealth: Payer: Self-pay

## 2020-10-22 NOTE — Telephone Encounter (Addendum)
Called and was not able to leave a message for pt to call back ----- Message from Romero Belling, MD sent at 10/19/2020 11:43 AM EDT ----- please contact patient: Estrogen is low.  I need to know the dosage of estradiol you are taking.  I also need to verify that you are taking it yourself.

## 2020-10-26 ENCOUNTER — Other Ambulatory Visit: Payer: Self-pay | Admitting: Endocrinology

## 2020-10-26 ENCOUNTER — Encounter: Payer: Self-pay | Admitting: Endocrinology

## 2020-10-26 NOTE — Progress Notes (Signed)
Letter sent out to pt regarding lab results.

## 2020-10-28 NOTE — Telephone Encounter (Signed)
Patient called to get information about Estradiol levels, medication refill, and letter of support.  Asked for and was provided recent lab results.  Patient declined to advise Estradiol dosage.  Wants a call back [phone] (updated phone number with today's call

## 2020-10-30 NOTE — Telephone Encounter (Signed)
Spoke with pt who advised she is taking the prescribed estradiol monthly injections. She does not think it's enough.  Pt was advised Dr Everardo All would be out of the office until 11/09/20 regarding letter of support.

## 2020-11-02 ENCOUNTER — Other Ambulatory Visit (HOSPITAL_COMMUNITY): Payer: Self-pay

## 2020-11-02 NOTE — Telephone Encounter (Signed)
Patient called to ask about letter of support, # to [facility], and for refill - patient was advised of the following:    #1 Letter of Support - advised Dr Everardo All is out of office until 11/09/20 and this can not be review/taken care of until he returns to office.  #2 - # for [facility], provider [phone])  #3 - REFILL REQUEST Patient states is out of Estradiol & Syringes - patient [facility] [address]

## 2020-11-03 ENCOUNTER — Telehealth: Payer: Self-pay | Admitting: Endocrinology

## 2020-11-03 NOTE — Telephone Encounter (Signed)
done

## 2020-11-03 NOTE — Telephone Encounter (Signed)
Pt is requesting a letter of support for surgery. Said she spoke with you at her last appt. Should she be advised you can not do the letter?

## 2020-11-04 ENCOUNTER — Other Ambulatory Visit (HOSPITAL_COMMUNITY): Payer: Self-pay

## 2020-11-06 ENCOUNTER — Other Ambulatory Visit (HOSPITAL_COMMUNITY): Payer: Self-pay

## 2020-11-06 MED FILL — Bictegravir-Emtricitabine-Tenofovir AF Tab 50-200-25 MG: ORAL | 30 days supply | Qty: 30 | Fill #2 | Status: AC

## 2020-11-11 ENCOUNTER — Other Ambulatory Visit (HOSPITAL_COMMUNITY): Payer: Self-pay

## 2020-11-11 NOTE — Telephone Encounter (Signed)
Pt states if any issues can give a call back

## 2020-11-11 NOTE — Telephone Encounter (Signed)
Pt calling in to follow up on the last messages that have been sent and states that needs a refill for  Estradiol & Syringes - patient [facility] [address]

## 2020-11-18 ENCOUNTER — Other Ambulatory Visit: Payer: Self-pay | Admitting: Endocrinology

## 2020-11-18 ENCOUNTER — Telehealth: Payer: Self-pay | Admitting: *Deleted

## 2020-11-18 MED ORDER — ESTRADIOL CYPIONATE 5 MG/ML IM OIL: 2.0000 mg | TOPICAL_OIL | INTRAMUSCULAR | 6 refills | Status: DC

## 2020-11-18 NOTE — Telephone Encounter (Signed)
Pt called stated Estrogen injection is not enough and needed refill. Please advise

## 2020-11-18 NOTE — Telephone Encounter (Signed)
Patient called asking if someone could return her call regarding her medication. She has been waiting for her refill for the Estrogen, and asked if nurse could speak to her.

## 2020-11-18 NOTE — Telephone Encounter (Signed)
Pt verified taking--estradol 5 mg and administer in the office.

## 2020-11-18 NOTE — Telephone Encounter (Signed)
Pt stated ---Hector [name] showed how to administered to pt.

## 2020-11-30 NOTE — Telephone Encounter (Signed)
Patient called medication estradiol cypionate is on back order and wants something in place of that, she has not had medication for almost 2 months.

## 2020-12-01 NOTE — Telephone Encounter (Signed)
LVM for pt to cb to the office to let us know which pharmacy should the medication be sent

## 2020-12-01 NOTE — Telephone Encounter (Signed)
Pt called again because she had not heard back regarding what medication she could take in place of her medication on back order.  [phone]

## 2020-12-02 ENCOUNTER — Other Ambulatory Visit: Payer: Self-pay | Admitting: Endocrinology

## 2020-12-02 DIAGNOSIS — Z20822 Contact with and (suspected) exposure to covid-19: Secondary | ICD-10-CM | POA: Diagnosis not present

## 2020-12-02 MED ORDER — ESTRADIOL CYPIONATE 5 MG/ML IM OIL: 2.0000 mg | TOPICAL_OIL | INTRAMUSCULAR | 6 refills | Status: DC

## 2020-12-02 NOTE — Telephone Encounter (Signed)
[facility] pharmacy off of [address] [place], [place], [place] [address] Phone: [phone]

## 2020-12-04 ENCOUNTER — Other Ambulatory Visit (HOSPITAL_COMMUNITY): Payer: Self-pay

## 2020-12-08 DIAGNOSIS — Z20822 Contact with and (suspected) exposure to covid-19: Secondary | ICD-10-CM | POA: Diagnosis not present

## 2020-12-09 ENCOUNTER — Other Ambulatory Visit (HOSPITAL_COMMUNITY): Payer: Self-pay

## 2020-12-10 NOTE — Telephone Encounter (Signed)
[facility] pharmacy off of [address] [place], [place] [address] Phone: [phone]  Pharmacy called to advise that this medication is on back order, they did look to see if any pharmacy in their [place] network of pharmacies.  There looked within a 25 miles radius and there is none in stock and medication is on back order.  Patient called, message left for patient that she will need to contact pharmacies on her own to find out who may have it in stock as this is not something we can do for her.  Patient advised in message if she found someone with the medication in stock she can let us know to have new RX sent to that location. Patient also advised in message that we can not address medication back order status as this is a manufacturer problem.

## 2020-12-11 ENCOUNTER — Other Ambulatory Visit (HOSPITAL_COMMUNITY): Payer: Self-pay

## 2020-12-15 DIAGNOSIS — Z20822 Contact with and (suspected) exposure to covid-19: Secondary | ICD-10-CM | POA: Diagnosis not present

## 2020-12-15 NOTE — Telephone Encounter (Signed)
Pt called with 2 questions for the Dr:  1- Pt has been without her medication for months and is wondering if there is anything else that could be in place of that?   2 - Pt is requesting a letter of support for surgery. Said she spoke with the Dr regarding this at her last appt and her breast surgery is coming up November 9th. Pt wants to know if Dr could do this for her?

## 2020-12-17 DIAGNOSIS — Z20822 Contact with and (suspected) exposure to covid-19: Secondary | ICD-10-CM | POA: Diagnosis not present

## 2020-12-18 ENCOUNTER — Other Ambulatory Visit: Payer: Self-pay | Admitting: Endocrinology

## 2020-12-18 DIAGNOSIS — Z789 Other specified health status: Secondary | ICD-10-CM

## 2020-12-18 NOTE — Telephone Encounter (Signed)
Spoke with pt and she stated that she has been taking the medication every month but has been out for the past 3 months but does not know why it did not show up in blood.

## 2020-12-18 NOTE — Telephone Encounter (Signed)
Spoke with pt and she stated that the pharmacy was stating that the Rx was on backorder. I told her to contact the pharmacy again just to confirm that it was still on backorder and if it was then she will need to contact her other preferred pharmacy to see ifr they have the Rx available and let us know so that we can send it there.

## 2020-12-18 NOTE — Telephone Encounter (Signed)
error 

## 2020-12-18 NOTE — Telephone Encounter (Signed)
Pt is calling and waiting response from provider about if there is alternative for the estradiol cypionate. Pt is not taking the medication at this time as she has been out and pharmacy's are stating that it's on back order.

## 2020-12-28 DIAGNOSIS — Z20822 Contact with and (suspected) exposure to covid-19: Secondary | ICD-10-CM | POA: Diagnosis not present

## 2021-01-14 ENCOUNTER — Telehealth: Payer: Self-pay

## 2021-01-14 NOTE — Telephone Encounter (Signed)
Pt contacted and documented in another phone encounter.

## 2021-01-14 NOTE — Telephone Encounter (Signed)
Pt is stating that pharmacy is stating that it's on backorder. Pt has called multiple pharmacy's. Pt would like a call back as soon as possible

## 2021-01-14 NOTE — Telephone Encounter (Signed)
Spoke with pt and she stated that she has been out of medication for the past 3 mos. She has contacted several different pharmacies and they all say that it is on backorder. I told the pt that an alternative may need to be put in place if in fact there is an alternative but I will let the provider know and we will go from there.  Please advise

## 2021-01-15 ENCOUNTER — Other Ambulatory Visit: Payer: Self-pay | Admitting: Endocrinology

## 2021-01-15 MED ORDER — ESTRADIOL 2 MG PO TABS: 2.0000 mg | ORAL_TABLET | Freq: Every day | ORAL | 3 refills | Status: DC

## 2021-01-15 NOTE — Telephone Encounter (Addendum)
Spoke with pt and he stated that it is ok to send in as long as you are sending in a stronger Rx of the estrogen.  [facility] [address]

## 2021-01-15 NOTE — Telephone Encounter (Signed)
Spoke with pt and he has been notified of the changes

## 2021-01-29 ENCOUNTER — Ambulatory Visit: Payer: Medicare Other | Admitting: Infectious Diseases

## 2021-02-25 ENCOUNTER — Other Ambulatory Visit: Payer: Self-pay

## 2021-02-25 ENCOUNTER — Ambulatory Visit (INDEPENDENT_AMBULATORY_CARE_PROVIDER_SITE_OTHER): Payer: Medicare Other | Admitting: Infectious Diseases

## 2021-02-25 ENCOUNTER — Encounter: Payer: Self-pay | Admitting: Infectious Diseases

## 2021-02-25 ENCOUNTER — Other Ambulatory Visit (HOSPITAL_COMMUNITY): Payer: Self-pay

## 2021-02-25 VITALS — BP 143/89 | HR 89 | Temp 98.4°F | Wt 278.0 lb

## 2021-02-25 DIAGNOSIS — Z113 Encounter for screening for infections with a predominantly sexual mode of transmission: Secondary | ICD-10-CM | POA: Diagnosis not present

## 2021-02-25 DIAGNOSIS — I10 Essential (primary) hypertension: Secondary | ICD-10-CM | POA: Diagnosis not present

## 2021-02-25 DIAGNOSIS — Z789 Other specified health status: Secondary | ICD-10-CM | POA: Diagnosis not present

## 2021-02-25 DIAGNOSIS — Z79899 Other long term (current) drug therapy: Secondary | ICD-10-CM | POA: Diagnosis not present

## 2021-02-25 DIAGNOSIS — B20 Human immunodeficiency virus [HIV] disease: Secondary | ICD-10-CM

## 2021-02-25 MED ORDER — BICTEGRAVIR-EMTRICITAB-TENOFOV 50-200-25 MG PO TABS
1.0000 | ORAL_TABLET | Freq: Every day | ORAL | 5 refills | Status: DC
  Filled 2021-02-25: qty 90, 90d supply, fill #0
  Filled 2021-03-01: qty 30, 30d supply, fill #0

## 2021-02-25 NOTE — Addendum Note (Signed)
Addended by: Caleb Decock C on: 02/25/2021 02:49 PM   Modules accepted: Orders

## 2021-02-25 NOTE — Assessment & Plan Note (Signed)
Doing well Will check labs today Offered/refused condoms.  Husband on prep Offer mpox vax and COVID booster.  Will refill biktarvy rtc in 9 months.

## 2021-02-25 NOTE — Assessment & Plan Note (Signed)
bordeline today.  Will f/u, assign PCP if remains elevated.

## 2021-02-25 NOTE — Addendum Note (Signed)
Addended by: Harley Alto on: 02/25/2021 04:18 PM   Modules accepted: Orders

## 2021-02-25 NOTE — Assessment & Plan Note (Signed)
Appreciate endo f/u Letter of support written for surgery.

## 2021-02-25 NOTE — Progress Notes (Signed)
   Subjective:    Patient ID: Vista Deck, adult  DOB: 10/27/1985, 35 y.o.        MRN: [id number]   HPI 35 yo M --> F first dx with HIV 2016. Was prev being cared for in [place]. Was prev on DRVr/TRV. Was seen at [facility] 07-2016 and started on triumeq after a med lapse.   S/he was restarted on biktarvy 05-2018 after another med lapse.     Has seen endo (last 09-2020) for transgender issues. Considering breast implants, consult 03-31-2021.  Has gained weight which is attributed to depo shots.  Has been feeling well. Has gained wt and is happy about it. Has been walking.  Got married. Partner is on PREP, HIV-   Has not gotten COVID vax this fall. Has gotten flu shot.    HIV 1 RNA Quant  Date Value  04/24/2020 <20 Copies/mL  08/30/2018 39 copies/mL (H)  06/08/2018 1,420 copies/mL (H)   CD4 T Cell Abs (/uL)  Date Value  08/30/2018 650  06/08/2018 440  03/28/2017 610     Health Maintenance  Topic Date Due  . PAP SMEAR-Modifier  Never done  . TETANUS/TDAP  05/26/2016  . COVID-19 Vaccine (4 - Booster for Pfizer series) 08/27/2020  . INFLUENZA VACCINE  12/21/2020  . Hepatitis C Screening  Completed  . HIV Screening  Completed  . HPV VACCINES  Aged Out      Review of Systems  Constitutional:  Negative for chills, fever and weight loss.  Respiratory:  Positive for cough (rare). Negative for shortness of breath.   Gastrointestinal:  Negative for constipation and diarrhea.  Genitourinary:  Positive for dysuria.  Psychiatric/Behavioral:  The patient does not have insomnia.    Please see HPI. All other systems reviewed and negative.     Objective:  Physical Exam Vitals reviewed.  Constitutional:      Appearance: She is obese.  HENT:     Mouth/Throat:     Mouth: Mucous membranes are moist.     Pharynx: No oropharyngeal exudate.  Eyes:     Extraocular Movements: Extraocular movements intact.     Pupils: Pupils are equal, round, and reactive to light.  Cardiovascular:      Rate and Rhythm: Normal rate and regular rhythm.  Pulmonary:     Effort: Pulmonary effort is normal.     Breath sounds: Normal breath sounds.  Abdominal:     General: Bowel sounds are normal. There is no distension.     Palpations: Abdomen is soft.     Tenderness: There is no abdominal tenderness.  Musculoskeletal:        General: No swelling or tenderness (none in L calf. no cordis.). Normal range of motion.     Cervical back: Normal range of motion and neck supple.     Right lower leg: Edema (trace, non-pitting.) present.     Left lower leg: No edema.  Neurological:     General: No focal deficit present.     Mental Status: She is alert.  Psychiatric:        Mood and Affect: Mood normal.           Assessment & Plan:

## 2021-02-26 LAB — T-HELPER CELL (CD4) - (RCID CLINIC ONLY)
CD4 % Helper T Cell: 32 % — ABNORMAL LOW (ref 33–65)
CD4 T Cell Abs: 839 /uL (ref 400–1790)

## 2021-03-01 ENCOUNTER — Other Ambulatory Visit: Payer: Self-pay

## 2021-03-01 ENCOUNTER — Telehealth: Payer: Self-pay

## 2021-03-01 ENCOUNTER — Other Ambulatory Visit: Payer: Self-pay | Admitting: *Deleted

## 2021-03-01 ENCOUNTER — Ambulatory Visit (INDEPENDENT_AMBULATORY_CARE_PROVIDER_SITE_OTHER): Payer: Medicare Other | Admitting: *Deleted

## 2021-03-01 ENCOUNTER — Other Ambulatory Visit (HOSPITAL_COMMUNITY): Payer: Self-pay

## 2021-03-01 DIAGNOSIS — A539 Syphilis, unspecified: Secondary | ICD-10-CM

## 2021-03-01 DIAGNOSIS — Z23 Encounter for immunization: Secondary | ICD-10-CM | POA: Diagnosis not present

## 2021-03-01 DIAGNOSIS — B20 Human immunodeficiency virus [HIV] disease: Secondary | ICD-10-CM

## 2021-03-01 LAB — CBC
HCT: 39.2 % (ref 38.5–50.0)
Hemoglobin: 13.4 g/dL (ref 13.2–17.1)
MCH: 29.8 pg (ref 27.0–33.0)
MCHC: 34.2 g/dL (ref 32.0–36.0)
MCV: 87.1 fL (ref 80.0–100.0)
MPV: 10.7 fL (ref 7.5–12.5)
Platelets: 250 10*3/uL (ref 140–400)
RBC: 4.5 10*6/uL (ref 4.20–5.80)
RDW: 12.6 % (ref 11.0–15.0)
WBC: 9.4 10*3/uL (ref 3.8–10.8)

## 2021-03-01 LAB — LIPID PANEL
Cholesterol: 143 mg/dL (ref <200)
HDL: 32 mg/dL — ABNORMAL LOW (ref >=40)
LDL Cholesterol (Calc): 87 mg/dL (calc)
Non-HDL Cholesterol (Calc): 111 mg/dL (calc) (ref <130)
Total CHOL/HDL Ratio: 4.5 (calc) (ref <5.0)
Triglycerides: 144 mg/dL (ref <150)

## 2021-03-01 LAB — COMPREHENSIVE METABOLIC PANEL
AG Ratio: 1 (calc) (ref 1.0–2.5)
ALT: 19 U/L (ref 9–46)
AST: 15 U/L (ref 10–40)
Albumin: 4.1 g/dL (ref 3.6–5.1)
Alkaline phosphatase (APISO): 60 U/L (ref 36–130)
BUN: 12 mg/dL (ref 7–25)
CO2: 27 mmol/L (ref 20–32)
Calcium: 9.7 mg/dL (ref 8.6–10.3)
Chloride: 106 mmol/L (ref 98–110)
Creat: 0.7 mg/dL (ref 0.60–1.26)
Globulin: 4.1 g/dL (calc) — ABNORMAL HIGH (ref 1.9–3.7)
Glucose, Bld: 94 mg/dL (ref 65–99)
Potassium: 3.6 mmol/L (ref 3.5–5.3)
Sodium: 139 mmol/L (ref 135–146)
Total Bilirubin: 0.3 mg/dL (ref 0.2–1.2)
Total Protein: 8.2 g/dL — ABNORMAL HIGH (ref 6.1–8.1)

## 2021-03-01 LAB — HIV-1 RNA QUANT-NO REFLEX-BLD
HIV 1 RNA Quant: 348 Copies/mL — ABNORMAL HIGH
HIV-1 RNA Quant, Log: 2.54 Log cps/mL — ABNORMAL HIGH

## 2021-03-01 LAB — RPR TITER: RPR Titer: 1:64 {titer} — ABNORMAL HIGH

## 2021-03-01 LAB — FLUORESCENT TREPONEMAL AB(FTA)-IGG-BLD: Fluorescent Treponemal ABS: REACTIVE — AB

## 2021-03-01 LAB — RPR: RPR Ser Ql: REACTIVE — AB

## 2021-03-01 MED ORDER — BICTEGRAVIR-EMTRICITAB-TENOFOV 50-200-25 MG PO TABS
1.0000 | ORAL_TABLET | Freq: Every day | ORAL | 5 refills | Status: DC
  Filled 2021-03-01: qty 30, 30d supply, fill #0
  Filled 2021-04-13 – 2021-04-22 (×2): qty 30, 30d supply, fill #1
  Filled 2021-06-28: qty 30, 30d supply, fill #2
  Filled 2021-09-07: qty 30, 30d supply, fill #3
  Filled 2021-11-01: qty 30, 30d supply, fill #4
  Filled 2021-12-31: qty 30, 30d supply, fill #5

## 2021-03-01 MED ORDER — PENICILLIN G BENZATHINE 1200000 UNIT/2ML IM SUSY
1.2000 10*6.[IU] | PREFILLED_SYRINGE | Freq: Once | INTRAMUSCULAR | Status: AC
  Administered 2021-03-01: 1.2 10*6.[IU] via INTRAMUSCULAR

## 2021-03-01 NOTE — Telephone Encounter (Signed)
-----   Message from Ginnie Smart, MD sent at 03/01/2021 11:39 AM EDT ----- Pt needs treatment for Syphilis- Benzathine Pen G 2.4 million units IM x 1  ----- Message ----- From: Janace Hoard Lab Results In Sent: 02/25/2021  10:41 PM EDT To: Ginnie Smart, MD

## 2021-03-01 NOTE — Telephone Encounter (Signed)
Patient advised of syphilis results and scheduled to come in today for treatment. Patient advised no sex for 10 days after treatment and to let partner know they can be tested and treated.

## 2021-03-01 NOTE — Progress Notes (Signed)
RN reviewed labs and allergies, offered condoms, advised patient to remain abstinent for 7-10 days after treatment, and that the health department may follow up with her to confirm treatment. Patient's questions answered to their satisfaction. Andree Coss, RN

## 2021-03-29 ENCOUNTER — Other Ambulatory Visit (HOSPITAL_COMMUNITY): Payer: Self-pay

## 2021-03-31 DIAGNOSIS — F640 Transsexualism: Secondary | ICD-10-CM | POA: Diagnosis not present

## 2021-04-06 ENCOUNTER — Telehealth: Payer: Self-pay | Admitting: Endocrinology

## 2021-04-06 NOTE — Telephone Encounter (Signed)
Message sent thru MyChart 

## 2021-04-06 NOTE — Telephone Encounter (Signed)
Patient is requesting a refill on BP medication.

## 2021-04-12 DIAGNOSIS — Z1152 Encounter for screening for COVID-19: Secondary | ICD-10-CM | POA: Diagnosis not present

## 2021-04-13 ENCOUNTER — Other Ambulatory Visit (HOSPITAL_COMMUNITY): Payer: Self-pay

## 2021-04-14 ENCOUNTER — Other Ambulatory Visit (HOSPITAL_COMMUNITY): Payer: Self-pay

## 2021-04-21 ENCOUNTER — Other Ambulatory Visit (HOSPITAL_COMMUNITY): Payer: Self-pay

## 2021-04-22 ENCOUNTER — Other Ambulatory Visit (HOSPITAL_COMMUNITY): Payer: Self-pay

## 2021-05-11 ENCOUNTER — Telehealth: Payer: Self-pay | Admitting: Endocrinology

## 2021-05-11 ENCOUNTER — Other Ambulatory Visit (HOSPITAL_COMMUNITY): Payer: Self-pay

## 2021-05-11 DIAGNOSIS — Z1152 Encounter for screening for COVID-19: Secondary | ICD-10-CM | POA: Diagnosis not present

## 2021-05-11 NOTE — Telephone Encounter (Signed)
PT stopped by office and needs the following:  1-Support letter to included: Name, DOB and when PT began hormones. Also, support for top surgery.  Please FAX TO: [phone]

## 2021-05-25 ENCOUNTER — Ambulatory Visit: Payer: Medicare Other | Admitting: Endocrinology

## 2021-06-28 ENCOUNTER — Other Ambulatory Visit (HOSPITAL_COMMUNITY): Payer: Self-pay

## 2021-06-29 ENCOUNTER — Telehealth: Payer: Self-pay

## 2021-06-29 NOTE — Telephone Encounter (Signed)
Called patient to get an appointment scheduled, call couldn't be completed at this time.

## 2021-07-07 ENCOUNTER — Other Ambulatory Visit (HOSPITAL_COMMUNITY): Payer: Self-pay

## 2021-07-09 ENCOUNTER — Other Ambulatory Visit (HOSPITAL_COMMUNITY): Payer: Self-pay

## 2021-07-19 ENCOUNTER — Other Ambulatory Visit (HOSPITAL_COMMUNITY): Payer: Self-pay

## 2021-07-20 ENCOUNTER — Other Ambulatory Visit (HOSPITAL_COMMUNITY): Payer: Self-pay

## 2021-07-21 ENCOUNTER — Other Ambulatory Visit (HOSPITAL_COMMUNITY): Payer: Self-pay

## 2021-09-07 ENCOUNTER — Other Ambulatory Visit (HOSPITAL_COMMUNITY): Payer: Self-pay

## 2021-09-29 ENCOUNTER — Other Ambulatory Visit (HOSPITAL_COMMUNITY): Payer: Self-pay

## 2021-10-04 ENCOUNTER — Other Ambulatory Visit (HOSPITAL_COMMUNITY): Payer: Self-pay

## 2021-10-13 ENCOUNTER — Encounter: Payer: Self-pay | Admitting: Infectious Diseases

## 2021-10-14 ENCOUNTER — Telehealth: Payer: Self-pay

## 2021-10-14 NOTE — Telephone Encounter (Signed)
[facility] Patient Advocate Encounter  [facility] and I have been unsuccsessful in reaching patient to be able to refill medication.    We have tried multiple times without a response.  Everest Brod, CPhT Specialty Pharmacy Patient Advocate [facility] Phone: [phone] Fax:  [phone]  

## 2021-11-01 ENCOUNTER — Other Ambulatory Visit (HOSPITAL_COMMUNITY): Payer: Self-pay

## 2021-11-18 ENCOUNTER — Other Ambulatory Visit (HOSPITAL_COMMUNITY): Payer: Self-pay

## 2021-11-22 ENCOUNTER — Other Ambulatory Visit (HOSPITAL_COMMUNITY): Payer: Self-pay

## 2021-11-24 ENCOUNTER — Other Ambulatory Visit (HOSPITAL_COMMUNITY): Payer: Self-pay

## 2021-11-30 ENCOUNTER — Telehealth: Payer: Self-pay | Admitting: Internal Medicine

## 2021-11-30 ENCOUNTER — Other Ambulatory Visit: Payer: Self-pay

## 2021-11-30 DIAGNOSIS — Z789 Other specified health status: Secondary | ICD-10-CM

## 2021-11-30 MED ORDER — ESTRADIOL 2 MG PO TABS: 2.0000 mg | ORAL_TABLET | Freq: Every day | ORAL | 3 refills | Status: DC

## 2021-11-30 NOTE — Telephone Encounter (Signed)
MEDICATION: Estradial   [facility] [address]  HAS THE PATIENT CONTACTED THEIR PHARMACY?  Yes  IS THIS A 90 DAY SUPPLY : unknown  IS PATIENT OUT OF MEDICATION: yes  IF NOT; HOW MUCH IS LEFT:   LAST APPOINTMENT DATE: @Visit  date not found  NEXT APPOINTMENT DATE:@8 /31/2023  DO WE HAVE YOUR PERMISSION TO LEAVE A DETAILED MESSAGE?:Yes  OTHER COMMENTS:    **Let patient know to contact pharmacy at the end of the day to make sure medication is ready. **  ** Please notify patient to allow 48-72 hours to process**  **Encourage patient to contact the pharmacy for refills or they can request refills through [facility]**

## 2021-11-30 NOTE — Telephone Encounter (Signed)
Estradiol tablet now sent to preferred pharmacy

## 2021-12-31 ENCOUNTER — Other Ambulatory Visit (HOSPITAL_COMMUNITY): Payer: Self-pay

## 2022-01-13 ENCOUNTER — Ambulatory Visit: Payer: Medicare Other | Admitting: Infectious Diseases

## 2022-01-13 NOTE — Progress Notes (Deleted)
Name: Hector [name]  DOB: May 25, 1985 MRN: [id number] PCP: Patient, No Pcp Per     Subjective:  No chief complaint on file.    HPI: Hector [name] is a 36 y.o. adult transgender male here for follow up HIV. First diagnosed in 2016LOV with Dr. Ninetta Lights in Oct 2022. VL at that time was 370 copies. Treated for early syphilis infection with 2.4 million units bicillin for RPR titer 1: 64.  Fill review shows she fills about every 2 months from [facility].   Follows with [place] endocrinology for hormone affirming therapy. She is taking Estrace pills       04/24/2020   10:39 AM  Depression screen PHQ 2/9  Decreased Interest 0  Down, Depressed, Hopeless 0  PHQ - 2 Score 0    Health Maintenance = Receives annual preventative care through {his/her} PCP and is up to date on all recommended screenings and vaccinations. {He/She} denies cigarette use, excessive alcohol use and other substance abuse. {He/She} is seeing a dentist regularly and no dental complaints today.   ROS  Past Medical History:  Diagnosis Date   Depression    Headache    History of fractured vertebra april 8th Diagnosed   [place] Se[place] out of a tree while climbing it.   Human immunodeficiency virus I infection (HCC) 2016   Hypertension    Immune deficiency disorder (HCC)    Mental disorder    Myocardial infarction [facility])     Outpatient Medications Prior to Visit  Medication Sig Dispense Refill   bictegravir-emtricitabine-tenofovir AF (BIKTARVY) 50-200-25 MG TABS tablet TAKE ONE TABLET BY MOUTH ONCE DAILY 30 tablet 5   dutasteride (AVODART) 0.5 MG capsule Take 1 capsule (0.5 mg total) by mouth daily. (Patient not taking: Reported on 02/25/2021) 30 capsule 5   estradiol (ESTRACE) 2 MG tablet Take 1 tablet (2 mg total) by mouth daily. 90 tablet 3   sharps container 1 each by Does not apply route as needed. (Patient not taking: Reported on 02/25/2021) 3 each 0   SYRINGE-NEEDLE, DISP, 3 ML (B-D SYRINGE/NEEDLE 3CC/23GX1") 23G X 1" 3  ML MISC Inject monthly (Patient not taking: Reported on 02/25/2021) 3 each 0   Syringe/Needle, Disp, (SYRINGE 3CC/18GX1-1/2") 18G X 1-1/2" 3 ML MISC Inject monthly (Patient not taking: Reported on 02/25/2021) 3 each 0   Facility-Administered Medications Prior to Visit  Medication Dose Route Frequency Provider Last Rate Last Admin   estradiol cypionate (DEPO-ESTRADIOL) 5 MG/ML injection 1.5 mg  1.5 mg Intramuscular Q28 days Romero Belling, MD   1.5 mg at 12/03/19 1352   estradiol cypionate (DEPO-ESTRADIOL) 5 MG/ML injection 5 mg  5 mg Intramuscular Q30 days Romero Belling, MD   5 mg at 10/01/19 1118     Allergies  Allergen Reactions   Tomato Anaphylaxis and Rash   Risperdal [Risperidone] Nausea And Vomiting   Aspirin Itching    Social History   Tobacco Use   Smoking status: Never   Smokeless tobacco: Never  Vaping Use   Vaping Use: Never used  Substance Use Topics   Alcohol use: No   Drug use: No    Family History  Problem Relation Age of Onset   CAD Mother 56       MI   Endocrine Disorder Neg Hx     Social History   Substance and Sexual Activity  Sexual Activity Never   Comment: DECLINED CONDOMS     Objective:  There were no vitals filed for this visit. There is no height or  weight on file to calculate BMI.  Physical Exam  Lab Results Lab Results  Component Value Date   WBC 9.4 02/25/2021   HGB 13.4 02/25/2021   HCT 39.2 02/25/2021   MCV 87.1 02/25/2021   PLT 250 02/25/2021    Lab Results  Component Value Date   CREATININE 0.70 02/25/2021   BUN 12 02/25/2021   NA 139 02/25/2021   K 3.6 02/25/2021   CL 106 02/25/2021   CO2 27 02/25/2021    Lab Results  Component Value Date   ALT 19 02/25/2021   AST 15 02/25/2021   ALKPHOS 77 01/12/2019   BILITOT 0.3 02/25/2021    Lab Results  Component Value Date   CHOL 143 02/25/2021   HDL 32 (L) 02/25/2021   LDLCALC 87 02/25/2021   TRIG 144 02/25/2021   CHOLHDL 4.5 02/25/2021   HIV 1 RNA Quant  Date  Value  02/25/2021 348 Copies/mL (H)  04/24/2020 <20 Copies/mL  08/30/2018 39 copies/mL (H)   CD4 T Cell Abs (/uL)  Date Value  02/25/2021 839  08/30/2018 650  06/08/2018 440     Assessment & Plan:   Problem List Items Addressed This Visit   None   Rexene Alberts, MSN, NP-C [facility] Pager: [phone] Office: [phone]  01/13/22  9:44 AM

## 2022-01-17 ENCOUNTER — Ambulatory Visit: Payer: Medicare Other | Admitting: Infectious Diseases

## 2022-01-20 ENCOUNTER — Ambulatory Visit: Payer: Medicare Other | Admitting: Internal Medicine

## 2022-01-20 ENCOUNTER — Other Ambulatory Visit: Payer: Self-pay | Admitting: Infectious Diseases

## 2022-01-20 ENCOUNTER — Other Ambulatory Visit (HOSPITAL_COMMUNITY): Payer: Self-pay

## 2022-01-20 DIAGNOSIS — B20 Human immunodeficiency virus [HIV] disease: Secondary | ICD-10-CM

## 2022-01-20 MED ORDER — BIKTARVY 50-200-25 MG PO TABS
1.0000 | ORAL_TABLET | Freq: Every day | ORAL | 0 refills | Status: DC
  Filled 2022-01-20: qty 30, 30d supply, fill #0

## 2022-01-20 NOTE — Progress Notes (Deleted)
Name: Hector [name]  MRN/ DOB: [id number], [birth year]    Age/ Sex: 36 y.o., adult     PCP: Patient, No Pcp Per   Reason for Endocrinology Evaluation: Gender dysphoria      Initial Endocrinology Clinic Visit: ***    PATIENT IDENTIFIER: [name] is a 36 y.o., adult with a past medical history of ***. She has followed with [facility] since *** for consultative assistance with management of her gender dysphoria .   HISTORICAL SUMMARY: The patient with a diagnosis of gender dysphoria and    SUBJECTIVE:    Today (01/20/2022):  [name] is here for a follow up on gender dysphoria      HISTORY:  Past Medical History:  Past Medical History:  Diagnosis Date   Depression    Headache    History of fractured vertebra april 8th Diagnosed   [place] out of a tree while climbing it.   Human immunodeficiency virus I infection (HCC) 2016   Hypertension    Immune deficiency disorder (HCC)    Mental disorder    Myocardial infarction [facility])    Past Surgical History:  Past Surgical History:  Procedure Laterality Date   NO PAST SURGERIES     Social History:  reports that she has never smoked. She has never used smokeless tobacco. She reports that she does not drink alcohol and does not use drugs. Family History:  Family History  Problem Relation Age of Onset   CAD Mother 68       MI   Endocrine Disorder Neg Hx      HOME MEDICATIONS: Allergies as of 01/20/2022       Reactions   Tomato Anaphylaxis, Rash   Risperdal [risperidone] Nausea And Vomiting   Aspirin Itching        Medication List        Accurate as of January 20, 2022  8:45 AM. If you have any questions, ask your nurse or doctor.          B-D SYRINGE/NEEDLE 3CC/23GX1" 23G X 1" 3 ML Misc Generic drug: SYRINGE-NEEDLE (DISP) 3 ML Inject monthly   SYRINGE 3CC/18GX1-1/2" 18G X 1-1/2" 3 ML Misc Inject monthly   Biktarvy 50-200-25 MG Tabs tablet Generic drug:  bictegravir-emtricitabine-tenofovir AF TAKE ONE TABLET BY MOUTH ONCE DAILY   dutasteride 0.5 MG capsule Commonly known as: AVODART Take 1 capsule (0.5 mg total) by mouth daily.   estradiol 2 MG tablet Commonly known as: ESTRACE Take 1 tablet (2 mg total) by mouth daily.   sharps container 1 each by Does not apply route as needed.          OBJECTIVE:   PHYSICAL EXAM: VS: There were no vitals taken for this visit.   EXAM: General: Pt appears well and is in NAD  Hydration: Well-hydrated with moist mucous membranes and good skin turgor  Eyes: External eye exam normal without stare, lid lag or exophthalmos.  EOM intact.  PERRL.  Ears, Nose, Throat: Hearing: Grossly intact bilaterally Dental: Good dentition  Throat: Clear without mass, erythema or exudate  Neck: General: Supple without adenopathy. Thyroid: Thyroid size normal.  No goiter or nodules appreciated. No thyroid bruit.  Lungs: Clear with good BS bilat with no rales, rhonchi, or wheezes  Heart: Auscultation: RRR.  Abdomen: Normoactive bowel sounds, soft, nontender, without masses or organomegaly palpable  Extremities: Gait and station: Normal gait  Digits and nails: No clubbing, cyanosis, petechiae, or nodes Head and neck: Normal alignment and  mobility BL UE: Normal ROM and strength. BL LE: No pretibial edema normal ROM and strength.  Skin: Hair: Texture and amount normal with gender appropriate distribution Skin Inspection: No rashes, acanthosis nigricans/skin tags. No lipohypertrophy Skin Palpation: Skin temperature, texture, and thickness normal to palpation  Neuro: Cranial nerves: II - XII grossly intact  Cerebellar: Normal coordination and movement; no tremor Motor: Normal strength throughout DTRs: 2+ and symmetric in UE without delay in relaxation phase  Mental Status: Judgment, insight: Intact Orientation: Oriented to time, place, and person Memory: Intact for recent and remote events Mood and affect: No  depression, anxiety, or agitation     DATA REVIEWED: ***    ASSESSMENT / PLAN / RECOMMENDATIONS:   ***  Plan: ***    Medications   ***   Signed electronically by: Lyndle Herrlich, MD  [facility]  [facility] [address] [place], [place] [address] Phone: [phone] FAX: [phone]      CC: Patient, No Pcp Per No address on file Phone: None  Fax: None   Return to Endocrinology clinic as below: Future Appointments  Date Time Provider Department Center  01/20/2022 11:30 AM Aimie Wagman, Konrad Dolores, MD [facility] None  02/10/2022 10:30 AM [name], NP [facility]

## 2022-01-21 ENCOUNTER — Other Ambulatory Visit (HOSPITAL_COMMUNITY): Payer: Self-pay

## 2022-01-25 ENCOUNTER — Other Ambulatory Visit (HOSPITAL_COMMUNITY): Payer: Self-pay

## 2022-02-10 ENCOUNTER — Telehealth: Payer: Self-pay

## 2022-02-10 ENCOUNTER — Ambulatory Visit: Payer: Medicare Other | Admitting: Infectious Diseases

## 2022-02-10 NOTE — Telephone Encounter (Signed)
Patient given number to the [facility] transgender program. Patient advise that provider can't make any adjustment to medication until seen for appointment. Patient advised that if we have a cancellation we will give a call to get in sooner.

## 2022-02-10 NOTE — Progress Notes (Deleted)
Name: Hector [name]  DOB: 12-Dec-1985 MRN: [id number] PCP: Patient, No Pcp Per    Patient Active Problem List   Diagnosis Date Noted   Transgender 06/14/2018   Hx of neurosyphilis 09/16/2016   Essential hypertension 09/16/2016   Human immunodeficiency virus I infection ([place]) 07/20/2016   Hepatitis B immune 07/18/2016   Non-compliance with treatment 10/06/2011   Insomnia related to another mental disorder 10/04/2011   Schizoaffective disorder, bipolar type ([place]) 10/03/2011    Class: Acute     Brief Narrative:  *** is a 36 y.o. *** with HIV disease, Dx ***. Marland Kitchen CD4 nadir *** VL *** HIV Risk: *** History of OIs: *** Intake Labs ***: Hep B sAg (***), sAb (***), cAb (***); Hep A (***), Hep C (***) Quantiferon (***) HLA B*5701 (***) G6PD: (***)   Previous Regimens: ***  Genotypes: ***  Subjective:  No chief complaint on file.    HPI: ***  LOV with Dr. Johnnye Sima in 02-2021. Diagnosed with HIV in 2016 previously on Prezista/norvir and Truvada, now on once daily Biktarvy since 2020. Several med lapses described.   On gender affirming hormone therapy, working with Endocrinology previously but does not appear she has seen them since, no show appt in August.      04/24/2020   10:39 AM  Depression screen PHQ 2/9  Decreased Interest 0  Down, Depressed, Hopeless 0  PHQ - 2 Score 0    Health Maintenance = Receives annual preventative care through {his/her} PCP and is up to date on all recommended screenings and vaccinations. {He/She} denies cigarette use, excessive alcohol use and other substance abuse. {He/She} is seeing a dentist regularly and no dental complaints today.   ROS  Past Medical History:  Diagnosis Date   Depression    Headache    History of fractured vertebra april 8th Diagnosed   [place] out of a tree while climbing it.   Human immunodeficiency virus I infection ([place]) 2016   Hypertension    Immune deficiency disorder ([place])    Mental disorder     Myocardial infarction [facility])     Outpatient Medications Prior to Visit  Medication Sig Dispense Refill   bictegravir-emtricitabine-tenofovir AF (BIKTARVY) 50-200-25 MG TABS tablet TAKE ONE TABLET BY MOUTH ONCE DAILY 30 tablet 0   dutasteride (AVODART) 0.5 MG capsule Take 1 capsule (0.5 mg total) by mouth daily. (Patient not taking: Reported on 02/25/2021) 30 capsule 5   estradiol (ESTRACE) 2 MG tablet Take 1 tablet (2 mg total) by mouth daily. 90 tablet 3   sharps container 1 each by Does not apply route as needed. (Patient not taking: Reported on 02/25/2021) 3 each 0   SYRINGE-NEEDLE, DISP, 3 ML (B-D SYRINGE/NEEDLE 3CC/23GX1") 23G X 1" 3 ML MISC Inject monthly (Patient not taking: Reported on 02/25/2021) 3 each 0   Syringe/Needle, Disp, (SYRINGE 3CC/18GX1-1/2") 18G X 1-1/2" 3 ML MISC Inject monthly (Patient not taking: Reported on 02/25/2021) 3 each 0   Facility-Administered Medications Prior to Visit  Medication Dose Route Frequency Provider Last Rate Last Admin   estradiol cypionate (DEPO-ESTRADIOL) 5 MG/ML injection 1.5 mg  1.5 mg Intramuscular Q28 days Renato Shin, MD   1.5 mg at 12/03/19 1352   estradiol cypionate (DEPO-ESTRADIOL) 5 MG/ML injection 5 mg  5 mg Intramuscular Q30 days Renato Shin, MD   5 mg at 10/01/19 1118     Allergies  Allergen Reactions   Tomato Anaphylaxis and Rash   Risperdal [Risperidone] Nausea And Vomiting   Aspirin Itching  Social History   Tobacco Use   Smoking status: Never   Smokeless tobacco: Never  Vaping Use   Vaping Use: Never used  Substance Use Topics   Alcohol use: No   Drug use: No    Family History  Problem Relation Age of Onset   CAD Mother 21       MI   Endocrine Disorder Neg Hx     Social History   Substance and Sexual Activity  Sexual Activity Never   Comment: DECLINED CONDOMS     Objective:  There were no vitals filed for this visit. There is no height or weight on file to calculate BMI.  Physical Exam  Lab  Results Lab Results  Component Value Date   WBC 9.4 02/25/2021   HGB 13.4 02/25/2021   HCT 39.2 02/25/2021   MCV 87.1 02/25/2021   PLT 250 02/25/2021    Lab Results  Component Value Date   CREATININE 0.70 02/25/2021   BUN 12 02/25/2021   NA 139 02/25/2021   K 3.6 02/25/2021   CL 106 02/25/2021   CO2 27 02/25/2021    Lab Results  Component Value Date   ALT 19 02/25/2021   AST 15 02/25/2021   ALKPHOS 77 01/12/2019   BILITOT 0.3 02/25/2021    Lab Results  Component Value Date   CHOL 143 02/25/2021   HDL 32 (L) 02/25/2021   LDLCALC 87 02/25/2021   TRIG 144 02/25/2021   CHOLHDL 4.5 02/25/2021   HIV 1 RNA Quant  Date Value  02/25/2021 348 Copies/mL (H)  04/24/2020 <20 Copies/mL  08/30/2018 39 copies/mL (H)   CD4 T Cell Abs (/uL)  Date Value  02/25/2021 839  08/30/2018 650  06/08/2018 440     Assessment & Plan:   Problem List Items Addressed This Visit   None   Janene Madeira, MSN, NP-C [facility] Pager: [phone] Office: [phone]  02/10/22  9:56 AM

## 2022-02-14 ENCOUNTER — Emergency Department (HOSPITAL_COMMUNITY)
Admission: EM | Admit: 2022-02-14 | Discharge: 2022-02-14 | Discharge status: Against Medical Advice | Payer: Medicare Other | Attending: Emergency Medicine | Admitting: Emergency Medicine

## 2022-02-14 ENCOUNTER — Emergency Department (HOSPITAL_COMMUNITY): Payer: Medicare Other

## 2022-02-14 ENCOUNTER — Other Ambulatory Visit: Payer: Self-pay

## 2022-02-14 ENCOUNTER — Encounter (HOSPITAL_COMMUNITY): Payer: Self-pay

## 2022-02-14 DIAGNOSIS — Z5321 Procedure and treatment not carried out due to patient leaving prior to being seen by health care provider: Secondary | ICD-10-CM | POA: Insufficient documentation

## 2022-02-14 DIAGNOSIS — R0989 Other specified symptoms and signs involving the circulatory and respiratory systems: Secondary | ICD-10-CM | POA: Diagnosis present

## 2022-02-14 DIAGNOSIS — R609 Edema, unspecified: Secondary | ICD-10-CM | POA: Diagnosis not present

## 2022-02-14 NOTE — ED Notes (Signed)
Decided she wanted to leave and not stay
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
## 2022-02-15 ENCOUNTER — Other Ambulatory Visit: Payer: Self-pay | Admitting: Infectious Diseases

## 2022-02-15 ENCOUNTER — Other Ambulatory Visit (HOSPITAL_COMMUNITY): Payer: Self-pay

## 2022-02-15 DIAGNOSIS — B20 Human immunodeficiency virus [HIV] disease: Secondary | ICD-10-CM

## 2022-02-15 MED ORDER — BIKTARVY 50-200-25 MG PO TABS
1.0000 | ORAL_TABLET | Freq: Every day | ORAL | 0 refills | Status: DC
  Filled 2022-02-15: qty 30, 30d supply, fill #0

## 2022-02-17 ENCOUNTER — Other Ambulatory Visit (HOSPITAL_COMMUNITY): Payer: Self-pay

## 2022-03-02 ENCOUNTER — Ambulatory Visit (HOSPITAL_COMMUNITY)
Admission: EM | Admit: 2022-03-02 | Discharge: 2022-03-02 | Disposition: A | Discharge status: Home / Self Care | Payer: Medicare Other | Attending: Physician Assistant | Admitting: Physician Assistant

## 2022-03-02 ENCOUNTER — Encounter (HOSPITAL_COMMUNITY): Payer: Self-pay | Admitting: Emergency Medicine

## 2022-03-02 DIAGNOSIS — Z1152 Encounter for screening for COVID-19: Secondary | ICD-10-CM | POA: Diagnosis not present

## 2022-03-02 DIAGNOSIS — R110 Nausea: Secondary | ICD-10-CM | POA: Insufficient documentation

## 2022-03-02 DIAGNOSIS — R051 Acute cough: Secondary | ICD-10-CM | POA: Insufficient documentation

## 2022-03-02 DIAGNOSIS — Z21 Asymptomatic human immunodeficiency virus [HIV] infection status: Secondary | ICD-10-CM | POA: Diagnosis not present

## 2022-03-02 DIAGNOSIS — J069 Acute upper respiratory infection, unspecified: Secondary | ICD-10-CM

## 2022-03-02 DIAGNOSIS — R197 Diarrhea, unspecified: Secondary | ICD-10-CM | POA: Insufficient documentation

## 2022-03-02 LAB — RESP PANEL BY RT-PCR (FLU A&B, COVID) ARPGX2
Influenza A by PCR: NEGATIVE
Influenza B by PCR: NEGATIVE
SARS Coronavirus 2 by RT PCR: NEGATIVE

## 2022-03-02 LAB — BASIC METABOLIC PANEL
Anion gap: 7 (ref 5–15)
BUN: 12 mg/dL (ref 6–20)
CO2: 24 mmol/L (ref 22–32)
Calcium: 9.2 mg/dL (ref 8.9–10.3)
Chloride: 108 mmol/L (ref 98–111)
Creatinine, Ser: 0.81 mg/dL (ref 0.61–1.24)
GFR, Estimated: >60 mL/min (ref >60)
Glucose, Bld: 99 mg/dL (ref 70–99)
Potassium: 4.1 mmol/L (ref 3.5–5.1)
Sodium: 139 mmol/L (ref 135–145)

## 2022-03-02 MED ORDER — BENZONATATE 100 MG PO CAPS: 100.0000 mg | ORAL_CAPSULE | Freq: Three times a day (TID) | ORAL | 0 refills | Status: DC

## 2022-03-02 MED ORDER — ONDANSETRON 4 MG PO TBDP: 4.0000 mg | ORAL_TABLET | Freq: Three times a day (TID) | ORAL | 0 refills | Status: DC | PRN

## 2022-03-02 MED ORDER — FLUTICASONE PROPIONATE 50 MCG/ACT NA SUSP: 1.0000 | Freq: Every day | NASAL | 0 refills | Status: DC

## 2022-03-02 NOTE — ED Provider Notes (Signed)
[place]    CSN: [id number] Arrival date & time: 03/02/22  0906      History   Chief Complaint Chief Complaint  Patient presents with   Cough   Nasal Congestion    HPI Delray Reza is a 36 y.o. adult.   Patient presents today with a 4-day history of URI symptoms.  Reports cough, loss of appetite, headache, nasal congestion, fatigue.  Denies any fever, chest pain, shortness of breath.  Does report nausea as well as diarrhea.  Denies any known sick contacts.  Patient is up-to-date on COVID-19 vaccine.  Has had COVID in the past several years ago.  Has been using Tylenol, Vicks, Advil without improvement of symptoms.  Denies any history of allergies, asthma, COPD.  Patient does not smoke.  Patient has a history of HIV and is taking antiviral therapy as prescribed.  Last blood work was October 2022 which did show detectable HIV quant and slightly low helper T4 cell counts.  Has not had any additional blood work since that time.    Past Medical History:  Diagnosis Date   Depression    Headache    History of fractured vertebra april 8th Diagnosed   [place] out of a tree while climbing it.   Human immunodeficiency virus I infection ([place]) 2016   Hypertension    Immune deficiency disorder ([place])    Mental disorder    Myocardial infarction [facility])     Patient Active Problem List   Diagnosis Date Noted   Transgender 06/14/2018   Hx of neurosyphilis 09/16/2016   Essential hypertension 09/16/2016   Human immunodeficiency virus I infection ([place]) 07/20/2016   Hepatitis B immune 07/18/2016   Non-compliance with treatment 10/06/2011   Insomnia related to another mental disorder 10/04/2011   Schizoaffective disorder, bipolar type ([place]) 10/03/2011    Class: Acute    Past Surgical History:  Procedure Laterality Date   NO PAST SURGERIES         Home Medications    Prior to Admission medications   Medication Sig Start Date End Date Taking? Authorizing Provider   benzonatate (TESSALON) 100 MG capsule Take 1 capsule (100 mg total) by mouth every 8 (eight) hours. 03/02/22  Yes Jalayna Josten K, PA-C  fluticasone (FLONASE) 50 MCG/ACT nasal spray Place 1 spray into both nostrils daily. 03/02/22  Yes Giovannina Mun K, PA-C  ondansetron (ZOFRAN-ODT) 4 MG disintegrating tablet Take 1 tablet (4 mg total) by mouth every 8 (eight) hours as needed for nausea or vomiting. 03/02/22  Yes Takeem Krotzer, Derry Skill, PA-C  bictegravir-emtricitabine-tenofovir AF (BIKTARVY) 50-200-25 MG TABS tablet TAKE ONE TABLET BY MOUTH ONCE DAILY 02/15/22 02/15/23  Mora Callas, NP  dutasteride (AVODART) 0.5 MG capsule Take 1 capsule (0.5 mg total) by mouth daily. Patient not taking: Reported on 02/25/2021 10/08/20   Renato Shin, MD  estradiol (ESTRACE) 2 MG tablet Take 1 tablet (2 mg total) by mouth daily. 11/30/21   Shamleffer, Melanie Crazier, MD  sharps container 1 each by Does not apply route as needed. Patient not taking: Reported on 02/25/2021 02/21/20   Renato Shin, MD  SYRINGE-NEEDLE, DISP, 3 ML (B-D SYRINGE/NEEDLE 3CC/23GX1") 23G X 1" 3 ML MISC Inject monthly Patient not taking: Reported on 02/25/2021 02/21/20   Renato Shin, MD  Syringe/Needle, Disp, (SYRINGE 3CC/18GX1-1/2") 18G X 1-1/2" 3 ML MISC Inject monthly Patient not taking: Reported on 02/25/2021 02/21/20   Renato Shin, MD    Family History Family History  Problem Relation Age of Onset  CAD Mother 28       MI   Endocrine Disorder Neg Hx     Social History Social History   Tobacco Use   Smoking status: Never   Smokeless tobacco: Never  Vaping Use   Vaping Use: Never used  Substance Use Topics   Alcohol use: No   Drug use: No     Allergies   Tomato, Risperdal [risperidone], and Aspirin   Review of Systems Review of Systems  Constitutional:  Positive for activity change. Negative for appetite change, fatigue and fever.  HENT:  Positive for congestion and sore throat. Negative for sinus pressure and  sneezing.   Respiratory:  Positive for cough. Negative for shortness of breath.   Cardiovascular:  Negative for chest pain.  Gastrointestinal:  Positive for diarrhea and nausea. Negative for abdominal pain and vomiting.  Neurological:  Positive for headaches. Negative for dizziness and light-headedness.     Physical Exam Triage Vital Signs ED Triage Vitals  Enc Vitals Group     BP 03/02/22 0951 (!) 146/80     Pulse Rate 03/02/22 0951 75     Resp 03/02/22 0951 18     Temp 03/02/22 0951 97.8 F (36.6 C)     Temp Source 03/02/22 0951 Oral     SpO2 03/02/22 0951 99 %     Weight --      Height --      Head Circumference --      Peak Flow --      Pain Score 03/02/22 0950 9     Pain Loc --      Pain Edu? --      Excl. in GC? --    No data found.  Updated Vital Signs BP (!) 146/80 (BP Location: Left Arm)   Pulse 75   Temp 97.8 F (36.6 C) (Oral)   Resp 18   SpO2 99%   Visual Acuity Right Eye Distance:   Left Eye Distance:   Bilateral Distance:    Right Eye Near:   Left Eye Near:    Bilateral Near:     Physical Exam Vitals reviewed.  Constitutional:      General: She is awake. She is not in acute distress.    Appearance: Normal appearance. She is well-developed. She is not ill-appearing.     Comments: Very pleasant patient appears stated age in no acute distress sitting comfortably in exam room  HENT:     Head: Normocephalic and atraumatic.     Right Ear: Tympanic membrane, ear canal and external ear normal. Tympanic membrane is not erythematous or bulging.     Left Ear: Tympanic membrane, ear canal and external ear normal. Tympanic membrane is not erythematous or bulging.     Nose:     Right Sinus: No maxillary sinus tenderness or frontal sinus tenderness.     Left Sinus: No maxillary sinus tenderness or frontal sinus tenderness.     Mouth/Throat:     Pharynx: Uvula midline. No oropharyngeal exudate or posterior oropharyngeal erythema.  Cardiovascular:     Rate  and Rhythm: Normal rate and regular rhythm.     Heart sounds: Normal heart sounds, S1 normal and S2 normal. No murmur heard. Pulmonary:     Effort: Pulmonary effort is normal.     Breath sounds: Normal breath sounds. No wheezing, rhonchi or rales.     Comments: Clear to auscultation bilaterally Musculoskeletal:     Right lower leg: No edema.  Left lower leg: No edema.  Psychiatric:        Behavior: Behavior is cooperative.      UC Treatments / Results  Labs (all labs ordered are listed, but only abnormal results are displayed) Labs Reviewed  RESP PANEL BY RT-PCR (FLU A&B, COVID) ARPGX2  BASIC METABOLIC PANEL    EKG   Radiology No results found.  Procedures Procedures (including critical care time)  Medications Ordered in UC Medications - No data to display  Initial Impression / Assessment and Plan / UC Course  I have reviewed the triage vital signs and the nursing notes.  Pertinent labs & imaging results that were available during my care of the patient were reviewed by me and considered in my medical decision making (see chart for details).     Patient is well-appearing, afebrile, nontoxic, nontachycardic.  No evidence of acute bacterial infection that would warrant initiation of antibiotics.  Concern for viral etiology given short duration of symptoms and clinical presentation.  Flu and COVID testing were obtained.  Given history of HIV patient is a candidate for Paxlovid.  We do not have a recent GFR on file so BMP was obtained.  She does not require any medication adjustment if positive for COVID and interested in Paxlovid.  We discussed symptomatic care and she was provided prescription for Zofran, Flonase, Tessalon.  Also recommended over-the-counter medications including Tylenol, Mucinex.  she is to rest and drink plenty of fluid.  If her symptoms are not improving by next week she should return for reevaluation.  If she has any worsening symptoms he needs to be  seen immediately to which she expressed understanding.  Work excuse note provided with current CDC return to work guidelines based on COVID test result.  Final Clinical Impressions(s) / UC Diagnoses   Final diagnoses:  Upper respiratory tract infection, unspecified type  Acute cough  Nausea without vomiting     Discharge Instructions      We are testing you for flu and COVID.  We will contact you if your testing is positive.  If you are positive for COVID we will consider Paxlovid to help you fight COVID.  Use Tessalon for cough.  Use Flonase for nasal congestion.  Use Zofran for nausea and vomiting.  Make sure you rest and drink plenty of fluid.  Eat small frequent meals.  If your symptoms are worsening in any way and you have worsening cough, fever, shortness of breath, nausea/vomiting interfere with oral intake, weakness you need to go to the emergency room immediately.    ED Prescriptions     Medication Sig Dispense Auth. Provider   fluticasone (FLONASE) 50 MCG/ACT nasal spray Place 1 spray into both nostrils daily. 16 g Deuntae Kocsis K, PA-C   ondansetron (ZOFRAN-ODT) 4 MG disintegrating tablet Take 1 tablet (4 mg total) by mouth every 8 (eight) hours as needed for nausea or vomiting. 20 tablet Neaveh Belanger K, PA-C   benzonatate (TESSALON) 100 MG capsule Take 1 capsule (100 mg total) by mouth every 8 (eight) hours. 21 capsule Arnol Mcgibbon K, PA-C      PDMP not reviewed this encounter.   Jeani Hawking, PA-C 03/02/22 1028

## 2022-03-02 NOTE — ED Triage Notes (Signed)
Pt reports a cough, headache, loss of appetite and nasal congestion x 4 days. Has been taking Advil, Tylenol, Vicks and drinking a lot of fluids.

## 2022-03-02 NOTE — Discharge Instructions (Signed)
We are testing you for flu and COVID.  We will contact you if your testing is positive.  If you are positive for COVID we will consider Paxlovid to help you fight COVID.  Use Tessalon for cough.  Use Flonase for nasal congestion.  Use Zofran for nausea and vomiting.  Make sure you rest and drink plenty of fluid.  Eat small frequent meals.  If your symptoms are worsening in any way and you have worsening cough, fever, shortness of breath, nausea/vomiting interfere with oral intake, weakness you need to go to the emergency room immediately.

## 2022-03-07 ENCOUNTER — Ambulatory Visit (INDEPENDENT_AMBULATORY_CARE_PROVIDER_SITE_OTHER): Payer: Medicare Other | Admitting: Infectious Diseases

## 2022-03-07 ENCOUNTER — Other Ambulatory Visit: Payer: Self-pay

## 2022-03-07 ENCOUNTER — Encounter: Payer: Self-pay | Admitting: Infectious Diseases

## 2022-03-07 VITALS — BP 157/94 | HR 88 | Resp 16 | Ht 74.0 in | Wt 277.0 lb

## 2022-03-07 DIAGNOSIS — Z789 Other specified health status: Secondary | ICD-10-CM

## 2022-03-07 DIAGNOSIS — Z23 Encounter for immunization: Secondary | ICD-10-CM | POA: Diagnosis not present

## 2022-03-07 DIAGNOSIS — B20 Human immunodeficiency virus [HIV] disease: Secondary | ICD-10-CM | POA: Diagnosis not present

## 2022-03-07 DIAGNOSIS — R030 Elevated blood-pressure reading, without diagnosis of hypertension: Secondary | ICD-10-CM

## 2022-03-07 DIAGNOSIS — Z113 Encounter for screening for infections with a predominantly sexual mode of transmission: Secondary | ICD-10-CM | POA: Diagnosis not present

## 2022-03-07 DIAGNOSIS — A539 Syphilis, unspecified: Secondary | ICD-10-CM

## 2022-03-07 NOTE — Progress Notes (Unsigned)
Name: Hector [name]  DOB: [birth year] MRN: [id number] PCP: Patient, No Pcp Per    Patient Active Problem List   Diagnosis Date Noted   Transgender 06/14/2018   Hx of neurosyphilis 09/16/2016   Essential hypertension 09/16/2016   Human immunodeficiency virus I infection ([place]) 07/20/2016   Hepatitis B immune 07/18/2016   Non-compliance with treatment 10/06/2011   Insomnia related to another mental disorder 10/04/2011   Schizoaffective disorder, bipolar type ([place]) 10/03/2011    Class: Acute     Brief Narrative:  *** is a 36 y.o. *** with HIV disease, Dx ***. Marland Kitchen CD4 nadir *** VL *** HIV Risk: *** History of OIs: *** Intake Labs ***: Hep B sAg (***), sAb (***), cAb (***); Hep A (***), Hep C (***) Quantiferon (***) HLA B*5701 (***) G6PD: (***)   Previous Regimens: ***  Genotypes: ***  Subjective:   Chief Complaint  Patient presents with   Follow-up    BP elevated wants BP meds      HPI: Hector [name] is here today for routine HIV care and follow up.  Gaining weight but not upset about it, necessarily.  She feels like she sleeps/naps more frequently than gets good quality sleep overnight. Worried a bit about blood pressure contributing to some headaches that are newer for her. 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  No changes to her health since last OV. She does not have a primary care provider that she sees and does not have a way to check her blood pressure at home. She does have a family history of high blood pressure and diabetes.       04/24/2020   10:39 AM  Depression screen PHQ 2/9  Decreased Interest 0  Down, Depressed, Hopeless 0  PHQ - 2 Score 0    ROS  Past Medical History:  Diagnosis Date   Depression    Headache    History of fractured vertebra april 8th Diagnosed   [place] out of a tree while climbing it.   Human immunodeficiency virus I infection ([place]) 2016   Hypertension    Immune deficiency disorder ([place])    Mental disorder    Myocardial infarction  [facility])     Outpatient Medications Prior to Visit  Medication Sig Dispense Refill   bictegravir-emtricitabine-tenofovir AF (BIKTARVY) 50-200-25 MG TABS tablet TAKE ONE TABLET BY MOUTH ONCE DAILY 30 tablet 0   estradiol (ESTRACE) 2 MG tablet Take 1 tablet (2 mg total) by mouth daily. 90 tablet 3   fluticasone (FLONASE) 50 MCG/ACT nasal spray Place 1 spray into both nostrils daily. 16 g 0   benzonatate (TESSALON) 100 MG capsule Take 1 capsule (100 mg total) by mouth every 8 (eight) hours. (Patient not taking: Reported on 03/07/2022) 21 capsule 0   dutasteride (AVODART) 0.5 MG capsule Take 1 capsule (0.5 mg total) by mouth daily. (Patient not taking: Reported on 02/25/2021) 30 capsule 5   estradiol (ESTRACE) 2 MG tablet Take by mouth. (Patient not taking: Reported on 03/07/2022)     ondansetron (ZOFRAN-ODT) 4 MG disintegrating tablet Take 1 tablet (4 mg total) by mouth every 8 (eight) hours as needed for nausea or vomiting. (Patient not taking: Reported on 03/07/2022) 20 tablet 0   sharps container 1 each by Does not apply route as needed. (Patient not taking: Reported on 02/25/2021) 3 each 0   SYRINGE-NEEDLE, DISP, 3 ML (B-D SYRINGE/NEEDLE 3CC/23GX1") 23G X 1" 3 ML MISC Inject monthly (Patient not taking: Reported on 02/25/2021) 3 each 0  Syringe/Needle, Disp, (SYRINGE 3CC/18GX1-1/2") 18G X 1-1/2" 3 ML MISC Inject monthly (Patient not taking: Reported on 02/25/2021) 3 each 0   Facility-Administered Medications Prior to Visit  Medication Dose Route Frequency Provider Last Rate Last Admin   estradiol cypionate (DEPO-ESTRADIOL) 5 MG/ML injection 1.5 mg  1.5 mg Intramuscular Q28 days Renato Shin, MD   1.5 mg at 12/03/19 1352   estradiol cypionate (DEPO-ESTRADIOL) 5 MG/ML injection 5 mg  5 mg Intramuscular Q30 days Renato Shin, MD   5 mg at 10/01/19 1118     Allergies  Allergen Reactions   Tomato Anaphylaxis and Rash   Risperdal [Risperidone] Nausea And Vomiting   Aspirin Itching    Social  History   Tobacco Use   Smoking status: Never   Smokeless tobacco: Never  Vaping Use   Vaping Use: Never used  Substance Use Topics   Alcohol use: No   Drug use: No    Family History  Problem Relation Age of Onset   CAD Mother 85       MI   Endocrine Disorder Neg Hx     Social History   Substance and Sexual Activity  Sexual Activity Never   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   Physical Exam   Lab Results Lab Results  Component Value Date   WBC 9.4 02/25/2021   HGB 13.4 02/25/2021   HCT 39.2 02/25/2021   MCV 87.1 02/25/2021   PLT 250 02/25/2021    Lab Results  Component Value Date   CREATININE 0.81 03/02/2022   BUN 12 03/02/2022   NA 139 03/02/2022   K 4.1 03/02/2022   CL 108 03/02/2022   CO2 24 03/02/2022    Lab Results  Component Value Date   ALT 19 02/25/2021   AST 15 02/25/2021   ALKPHOS 77 01/12/2019   BILITOT 0.3 02/25/2021    Lab Results  Component Value Date   CHOL 143 02/25/2021   HDL 32 (L) 02/25/2021   LDLCALC 87 02/25/2021   TRIG 144 02/25/2021   CHOLHDL 4.5 02/25/2021   HIV 1 RNA Quant  Date Value  02/25/2021 348 Copies/mL (H)  04/24/2020 <20 Copies/mL  08/30/2018 39 copies/mL (H)   CD4 T Cell Abs (/uL)  Date Value  02/25/2021 839  08/30/2018 650  06/08/2018 440     Assessment & Plan:   Problem List Items Addressed This Visit   None   Janene Madeira, MSN, NP-C [facility] [place] Pager: [phone] Office: [phone]  03/07/22  3:13 PM

## 2022-03-07 NOTE — Patient Instructions (Addendum)
For primary care services, please call one of the following clinics for a new patient appointment:   Each clinic have different programs to support your health care needs when you don't have access to health insurance.   Internal Medicine Clinic - ground floor of [place].  [phone]   Lets see if we can get you to check your blood pressure at the pharmacy when you pick up - if it is still > 150 / 90 we should consider putting you on a blood pressure medication and getting you in with internal medicine / primary care team to help make the best decisions for your treatment for this.   Would like to see you back in 3 months - Gave you your flu shot today

## 2022-03-08 DIAGNOSIS — R030 Elevated blood-pressure reading, without diagnosis of hypertension: Secondary | ICD-10-CM | POA: Insufficient documentation

## 2022-03-08 NOTE — Assessment & Plan Note (Addendum)
Very well controlled on once daily biktarvy. No concerns with access or adherence to medication. They are tolerating the medication well without side effects. No drug interactions identified. Pertinent lab tests ordered today.  No changes to insurance coverage.  No dental needs today.  No concern over anxious/depressed mood.  Sexual health and family planning discussed - asymptomatic screening.  Vaccines updated today - flu shot provided.   FU in 84m.

## 2022-03-08 NOTE — Assessment & Plan Note (Signed)
Has had elevated blood pressure readings in the past - some headaches but also not sleeping well / much that could be contributing. Will see if she can follow BPs at home to see what levels look like (checks at the pharmacy) If persistently > 150/90 or if we find she is diabetic/pre-diabetic will start medication now with lisinopril-hctz combo.  RTC in 2-3 months with readings.  She would benefit from referral to primary care for care of this condition and preventative care. She was in agreement.

## 2022-03-08 NOTE — Assessment & Plan Note (Signed)
Follows with endocrinology and looking into surgery

## 2022-03-10 ENCOUNTER — Other Ambulatory Visit (HOSPITAL_COMMUNITY): Payer: Self-pay

## 2022-03-14 ENCOUNTER — Other Ambulatory Visit: Payer: Self-pay | Admitting: Infectious Diseases

## 2022-03-14 ENCOUNTER — Other Ambulatory Visit (HOSPITAL_COMMUNITY): Payer: Self-pay

## 2022-03-14 DIAGNOSIS — B20 Human immunodeficiency virus [HIV] disease: Secondary | ICD-10-CM

## 2022-03-14 MED ORDER — BIKTARVY 50-200-25 MG PO TABS
1.0000 | ORAL_TABLET | Freq: Every day | ORAL | 0 refills | Status: DC
  Filled 2022-03-14: qty 30, 30d supply, fill #0

## 2022-03-17 ENCOUNTER — Other Ambulatory Visit (HOSPITAL_COMMUNITY): Payer: Self-pay

## 2022-04-11 ENCOUNTER — Other Ambulatory Visit (HOSPITAL_COMMUNITY): Payer: Self-pay

## 2022-04-13 ENCOUNTER — Other Ambulatory Visit (HOSPITAL_COMMUNITY): Payer: Self-pay

## 2022-04-15 ENCOUNTER — Other Ambulatory Visit (HOSPITAL_COMMUNITY): Payer: Self-pay

## 2022-04-18 ENCOUNTER — Other Ambulatory Visit: Payer: Self-pay | Admitting: Infectious Diseases

## 2022-04-18 ENCOUNTER — Other Ambulatory Visit (HOSPITAL_BASED_OUTPATIENT_CLINIC_OR_DEPARTMENT_OTHER): Payer: Self-pay

## 2022-04-18 ENCOUNTER — Other Ambulatory Visit (HOSPITAL_COMMUNITY): Payer: Self-pay

## 2022-04-18 DIAGNOSIS — B20 Human immunodeficiency virus [HIV] disease: Secondary | ICD-10-CM

## 2022-04-18 MED ORDER — BIKTARVY 50-200-25 MG PO TABS
1.0000 | ORAL_TABLET | Freq: Every day | ORAL | 5 refills | Status: DC
  Filled 2022-04-18: qty 30, 30d supply, fill #0
  Filled 2022-05-30: qty 30, 30d supply, fill #1
  Filled 2022-06-23: qty 30, 30d supply, fill #2
  Filled 2022-08-29: qty 30, 30d supply, fill #3

## 2022-04-21 ENCOUNTER — Other Ambulatory Visit (HOSPITAL_COMMUNITY): Payer: Self-pay

## 2022-05-05 ENCOUNTER — Other Ambulatory Visit (HOSPITAL_COMMUNITY): Payer: Self-pay

## 2022-05-05 ENCOUNTER — Other Ambulatory Visit: Payer: Self-pay

## 2022-05-06 ENCOUNTER — Other Ambulatory Visit (HOSPITAL_COMMUNITY): Payer: Self-pay

## 2022-05-10 ENCOUNTER — Other Ambulatory Visit (HOSPITAL_COMMUNITY): Payer: Self-pay

## 2022-05-27 ENCOUNTER — Other Ambulatory Visit (HOSPITAL_COMMUNITY): Payer: Self-pay

## 2022-05-30 ENCOUNTER — Other Ambulatory Visit (HOSPITAL_COMMUNITY): Payer: Self-pay

## 2022-05-31 ENCOUNTER — Other Ambulatory Visit: Payer: Self-pay

## 2022-06-07 ENCOUNTER — Ambulatory Visit: Payer: Medicare Other | Admitting: Infectious Diseases

## 2022-06-16 ENCOUNTER — Ambulatory Visit (INDEPENDENT_AMBULATORY_CARE_PROVIDER_SITE_OTHER): Payer: 59 | Admitting: Internal Medicine

## 2022-06-16 ENCOUNTER — Encounter: Payer: Self-pay | Admitting: Internal Medicine

## 2022-06-16 VITALS — BP 124/80 | HR 68 | Ht 74.0 in | Wt 298.0 lb

## 2022-06-16 DIAGNOSIS — Z789 Other specified health status: Secondary | ICD-10-CM | POA: Diagnosis not present

## 2022-06-16 DIAGNOSIS — F640 Transsexualism: Secondary | ICD-10-CM

## 2022-06-16 LAB — CBC
HCT: 40.5 % (ref 39.0–52.0)
Hemoglobin: 13.8 g/dL (ref 13.0–17.0)
MCHC: 34.1 g/dL (ref 30.0–36.0)
MCV: 89.3 fl (ref 78.0–100.0)
Platelets: 237 10*3/uL (ref 150.0–400.0)
RBC: 4.54 Mil/uL (ref 4.22–5.81)
RDW: 13.5 % (ref 11.5–15.5)
WBC: 9.1 10*3/uL (ref 4.0–10.5)

## 2022-06-16 LAB — BASIC METABOLIC PANEL
BUN: 15 mg/dL (ref 6–23)
CO2: 28 mEq/L (ref 19–32)
Calcium: 9.4 mg/dL (ref 8.4–10.5)
Chloride: 103 mEq/L (ref 96–112)
Creatinine, Ser: 0.81 mg/dL (ref 0.40–1.50)
GFR: 113.34 mL/min (ref >60.00)
Glucose, Bld: 108 mg/dL — ABNORMAL HIGH (ref 70–99)
Potassium: 4.1 mEq/L (ref 3.5–5.1)
Sodium: 138 mEq/L (ref 135–145)

## 2022-06-16 LAB — TESTOSTERONE: Testosterone: 439.98 ng/dL (ref 300.00–890.00)

## 2022-06-16 MED ORDER — SPIRONOLACTONE 50 MG PO TABS: 50.0000 mg | ORAL_TABLET | Freq: Two times a day (BID) | ORAL | 2 refills | Status: DC

## 2022-06-16 MED ORDER — SPIRONOLACTONE 50 MG PO TABS: 50.0000 mg | ORAL_TABLET | Freq: Every day | ORAL | 2 refills | Status: DC

## 2022-06-16 NOTE — Patient Instructions (Signed)
Start Spironolactone 50 mg, 1 tablet daily  Will see if the estrogen will need to be adjusted following blood work

## 2022-06-16 NOTE — Progress Notes (Signed)
Name: Hector [name]  MRN/ DOB: [id number], [birth year]    Age/ Sex: 37 y.o., adult     PCP: Patient, No Pcp Per   Reason for Endocrinology Evaluation: Gender dysphoria     Initial Endocrinology Clinic Visit: 07/11/2018    PATIENT IDENTIFIER: Hector [name] is a 37 y.o., adult with a past medical history of HIV, schizoaffective disorder, gender dysphoria. She has followed with [facility] since 07/11/2018 for consultative assistance with management of her gender dysphoria.   HISTORICAL SUMMARY: The patient has a diagnosis of gender dysphoria.  He was started on estrogen replacement therapy in 2020.  Patient was started on dulasteride  through  his previous endocrinologist in 2021   Patient was followed by Dr. Loanne Drilling from 2020 until 2022  SUBJECTIVE:    Today (06/16/2022):  Hector [name] is here for follow-up on gender dysphoria.  Pt is disacidify with results  Denies breast growth  Has slowing of facial hair growth   NO Fh or personal Hx of VTE  Denies breast or ovarian cancer  Quit smoking last year    Estradiol 1.5 mg Q 28 days -not taking  Estrace 2 mg, 2 tabs  daily     HISTORY:  Past Medical History:  Past Medical History:  Diagnosis Date   Depression    Headache    History of fractured vertebra april 8th Diagnosed   [place] out of a tree while climbing it.   Human immunodeficiency virus I infection ([place]) 2016   Hypertension    Immune deficiency disorder ([place])    Mental disorder    Myocardial infarction [facility])    Past Surgical History:  Past Surgical History:  Procedure Laterality Date   NO PAST SURGERIES     Social History:  reports that she has never smoked. She has never used smokeless tobacco. She reports that she does not drink alcohol and does not use drugs. Family History:  Family History  Problem Relation Age of Onset   CAD Mother 26       MI   Endocrine Disorder Neg Hx      HOME MEDICATIONS: Allergies as of 06/16/2022        Reactions   Tomato Anaphylaxis, Rash   Risperdal [risperidone] Nausea And Vomiting   Aspirin Itching        Medication List        Accurate as of June 16, 2022  9:33 AM. If you have any questions, ask your nurse or doctor.          STOP taking these medications    B-D SYRINGE/NEEDLE 3CC/23GX1" 23G X 1" 3 ML Misc Generic drug: SYRINGE-NEEDLE (DISP) 3 ML Stopped by: Dorita Sciara, MD   benzonatate 100 MG capsule Commonly known as: TESSALON Stopped by: Dorita Sciara, MD   dutasteride 0.5 MG capsule Commonly known as: AVODART Stopped by: Dorita Sciara, MD   ondansetron 4 MG disintegrating tablet Commonly known as: ZOFRAN-ODT Stopped by: Dorita Sciara, MD   sharps container Stopped by: Dorita Sciara, MD   SYRINGE 3CC/18GX1-1/2" 18G X 1-1/2" 3 ML Misc Stopped by: Dorita Sciara, MD       TAKE these medications    Biktarvy 50-200-25 MG Tabs tablet Generic drug: bictegravir-emtricitabine-tenofovir AF Take 1 tablet by mouth daily.   estradiol 2 MG tablet Commonly known as: ESTRACE Take 1 tablet (2 mg total) by mouth daily. What changed: Another medication with the same name was removed. Continue taking this  medication, and follow the directions you see here. Changed by: Dorita Sciara, MD   fluticasone 50 MCG/ACT nasal spray Commonly known as: FLONASE Place 1 spray into both nostrils daily.   spironolactone 50 MG tablet Commonly known as: ALDACTONE Take 1 tablet (50 mg total) by mouth daily. Started by: Dorita Sciara, MD          OBJECTIVE:   PHYSICAL EXAM: VS: BP 124/80 (BP Location: Left Arm, Patient Position: Sitting, Cuff Size: Large)   Pulse 68   Ht 6\' 2"  (1.88 m)   Wt 298 lb (135.2 kg)   SpO2 96%   BMI 38.26 kg/m    EXAM: General: Pt appears well and is in NAD  Eyes: External eye exam normal without stare, lid lag or exophthalmos.    Neck: General: Supple without  adenopathy. Thyroid: Thyroid size normal.  No goiter or nodules appreciated.   Lungs: Clear with good BS bilat with no rales, rhonchi, or wheezes  Heart: Auscultation: RRR.  Abdomen: soft, nontender  Extremities:  BL LE: No pretibial edema normal  Mental Status: Judgment, insight: Intact Orientation: Oriented to time, place, and person Mood and affect: No depression, anxiety, or agitation     DATA REVIEWED:    Latest Reference Range & Units 06/16/22 07:47  Sodium 135 - 145 mEq/L 138  Potassium 3.5 - 5.1 mEq/L 4.1  Chloride 96 - 112 mEq/L 103  CO2 19 - 32 mEq/L 28  Glucose 70 - 99 mg/dL 108 (H)  BUN 6 - 23 mg/dL 15  Creatinine 0.40 - 1.50 mg/dL 0.81  Calcium 8.4 - 10.5 mg/dL 9.4  GFR >60.00 mL/min 113.34    Latest Reference Range & Units 06/16/22 07:47  WBC 4.0 - 10.5 K/uL 9.1  RBC 4.22 - 5.81 Mil/uL 4.54  Hemoglobin 13.0 - 17.0 g/dL 13.8  HCT 39.0 - 52.0 % 40.5  MCV 78.0 - 100.0 fl 89.3  MCHC 30.0 - 36.0 g/dL 34.1  RDW 11.5 - 15.5 % 13.5  Platelets 150.0 - 400.0 K/uL 237.0    Latest Reference Range & Units 06/16/22 07:47  Testosterone 300.00 - 890.00 ng/dL 439.98    Latest Reference Range & Units 06/16/22 07:47  Estradiol < OR = 39 pg/mL 43 (H)    Old records , labs and images have been reviewed.    ASSESSMENT / PLAN / RECOMMENDATIONS:   Gender Dysphoria :  - M to F -Patient is not satisfied with outcome of hormone affirming therapy -Patient has not been on spironolactone, we discussed starting this today, we discussed risk of frequency as well as dehydration as it is a diuretic -Estrogen level continues to be low but stable over the past year, patient assures me she has been taking 2 tabs of estradiol (prescription is for 1 tablet daily) -Will increase estrogen level as below.  Discussed the risk of increased breast cancer and thromboembolic events with estrogen therapy  Medications   Start spironolactone 50 mg BID Increase Estradiol 2 mg, 2 tabs in the  morning and 1 tab in the evening   Follow-up in 4 months   Signed electronically by: Mack Guise, MD  [facility] Endocrinology  [facility] [address] [place], [place] [address] Phone: [phone] FAX: [phone]      CC: Patient, No Pcp Per No address on file Phone: None  Fax: None   Return to Endocrinology clinic as below: Future Appointments  Date Time Provider [place]  11/18/2022 10:50 AM  Liberti Appleton, Melanie Crazier, MD [id number] None

## 2022-06-17 ENCOUNTER — Other Ambulatory Visit (HOSPITAL_COMMUNITY): Payer: Self-pay

## 2022-06-17 LAB — ESTRADIOL: Estradiol: 43 pg/mL — ABNORMAL HIGH (ref <=39)

## 2022-06-17 MED ORDER — ESTRADIOL 2 MG PO TABS: 2.0000 mg | ORAL_TABLET | ORAL | 2 refills | Status: DC

## 2022-06-23 ENCOUNTER — Other Ambulatory Visit (HOSPITAL_COMMUNITY): Payer: Self-pay

## 2022-07-19 ENCOUNTER — Other Ambulatory Visit (HOSPITAL_COMMUNITY): Payer: Self-pay

## 2022-07-22 ENCOUNTER — Other Ambulatory Visit (HOSPITAL_COMMUNITY): Payer: Self-pay

## 2022-07-28 ENCOUNTER — Ambulatory Visit: Payer: 59 | Admitting: Infectious Diseases

## 2022-08-01 ENCOUNTER — Ambulatory Visit: Payer: 59 | Admitting: Infectious Diseases

## 2022-08-17 ENCOUNTER — Other Ambulatory Visit (HOSPITAL_COMMUNITY): Payer: Self-pay

## 2022-08-21 ENCOUNTER — Emergency Department (HOSPITAL_COMMUNITY)
Admission: EM | Admit: 2022-08-21 | Discharge: 2022-08-21 | Disposition: A | Discharge status: Home / Self Care | Payer: 59 | Attending: Emergency Medicine | Admitting: Emergency Medicine

## 2022-08-21 ENCOUNTER — Other Ambulatory Visit: Payer: Self-pay

## 2022-08-21 ENCOUNTER — Encounter (HOSPITAL_COMMUNITY): Payer: Self-pay

## 2022-08-21 ENCOUNTER — Emergency Department (HOSPITAL_COMMUNITY): Payer: 59

## 2022-08-21 DIAGNOSIS — R112 Nausea with vomiting, unspecified: Secondary | ICD-10-CM | POA: Diagnosis present

## 2022-08-21 DIAGNOSIS — K0501 Acute gingivitis, non-plaque induced: Secondary | ICD-10-CM | POA: Insufficient documentation

## 2022-08-21 DIAGNOSIS — K0889 Other specified disorders of teeth and supporting structures: Secondary | ICD-10-CM

## 2022-08-21 DIAGNOSIS — D72829 Elevated white blood cell count, unspecified: Secondary | ICD-10-CM | POA: Diagnosis not present

## 2022-08-21 DIAGNOSIS — R1012 Left upper quadrant pain: Secondary | ICD-10-CM | POA: Diagnosis not present

## 2022-08-21 DIAGNOSIS — R197 Diarrhea, unspecified: Secondary | ICD-10-CM | POA: Insufficient documentation

## 2022-08-21 DIAGNOSIS — K0510 Chronic gingivitis, plaque induced: Secondary | ICD-10-CM

## 2022-08-21 LAB — CBC WITH DIFFERENTIAL/PLATELET
Abs Immature Granulocytes: 0.03 10*3/uL (ref 0.00–0.07)
Basophils Absolute: 0.1 10*3/uL (ref 0.0–0.1)
Basophils Relative: 1 %
Eosinophils Absolute: 0.5 10*3/uL (ref 0.0–0.5)
Eosinophils Relative: 4 %
HCT: 36.3 % — ABNORMAL LOW (ref 39.0–52.0)
Hemoglobin: 12.9 g/dL — ABNORMAL LOW (ref 13.0–17.0)
Immature Granulocytes: 0 %
Lymphocytes Relative: 21 %
Lymphs Abs: 2.3 10*3/uL (ref 0.7–4.0)
MCH: 29.9 pg (ref 26.0–34.0)
MCHC: 35.5 g/dL (ref 30.0–36.0)
MCV: 84.2 fL (ref 80.0–100.0)
Monocytes Absolute: 0.6 10*3/uL (ref 0.1–1.0)
Monocytes Relative: 5 %
Neutro Abs: 7.6 10*3/uL (ref 1.7–7.7)
Neutrophils Relative %: 69 %
Platelets: 216 10*3/uL (ref 150–400)
RBC: 4.31 MIL/uL (ref 4.22–5.81)
RDW: 12.4 % (ref 11.5–15.5)
WBC: 11 10*3/uL — ABNORMAL HIGH (ref 4.0–10.5)
nRBC: 0 % (ref 0.0–0.2)

## 2022-08-21 LAB — COMPREHENSIVE METABOLIC PANEL
ALT: 20 U/L (ref 0–44)
AST: 17 U/L (ref 15–41)
Albumin: 3.5 g/dL (ref 3.5–5.0)
Alkaline Phosphatase: 66 U/L (ref 38–126)
Anion gap: 9 (ref 5–15)
BUN: 8 mg/dL (ref 6–20)
CO2: 26 mmol/L (ref 22–32)
Calcium: 9.1 mg/dL (ref 8.9–10.3)
Chloride: 101 mmol/L (ref 98–111)
Creatinine, Ser: 0.77 mg/dL (ref 0.61–1.24)
GFR, Estimated: >60 mL/min (ref >60)
Glucose, Bld: 124 mg/dL — ABNORMAL HIGH (ref 70–99)
Potassium: 3.7 mmol/L (ref 3.5–5.1)
Sodium: 136 mmol/L (ref 135–145)
Total Bilirubin: 0.4 mg/dL (ref 0.3–1.2)
Total Protein: 7.1 g/dL (ref 6.5–8.1)

## 2022-08-21 LAB — URINALYSIS, ROUTINE W REFLEX MICROSCOPIC
Bacteria, UA: NONE SEEN
Bilirubin Urine: NEGATIVE
Glucose, UA: NEGATIVE mg/dL
Ketones, ur: NEGATIVE mg/dL
Leukocytes,Ua: NEGATIVE
Nitrite: NEGATIVE
Protein, ur: NEGATIVE mg/dL
Specific Gravity, Urine: 1.015 (ref 1.005–1.030)
pH: 5 (ref 5.0–8.0)

## 2022-08-21 LAB — LIPASE, BLOOD: Lipase: 30 U/L (ref 11–51)

## 2022-08-21 LAB — ACETAMINOPHEN LEVEL: Acetaminophen (Tylenol), Serum: 23 ug/mL (ref 10–30)

## 2022-08-21 MED ORDER — CHLORHEXIDINE GLUCONATE 0.12 % MT SOLN: 15.0000 mL | Freq: Two times a day (BID) | OROMUCOSAL | 0 refills | Status: DC

## 2022-08-21 MED ORDER — ONDANSETRON HCL 4 MG/2ML IJ SOLN
4.0000 mg | Freq: Once | INTRAMUSCULAR | Status: AC
  Administered 2022-08-21: 4 mg via INTRAVENOUS
  Filled 2022-08-21: qty 2

## 2022-08-21 MED ORDER — IBUPROFEN 800 MG PO TABS
800.0000 mg | ORAL_TABLET | Freq: Once | ORAL | Status: AC
  Administered 2022-08-21: 800 mg via ORAL
  Filled 2022-08-21: qty 1

## 2022-08-21 MED ORDER — LACTATED RINGERS IV BOLUS
1000.0000 mL | Freq: Once | INTRAVENOUS | Status: AC
  Administered 2022-08-21: 1000 mL via INTRAVENOUS

## 2022-08-21 MED ORDER — ONDANSETRON 4 MG PO TBDP: 4.0000 mg | ORAL_TABLET | Freq: Three times a day (TID) | ORAL | 0 refills | Status: DC | PRN

## 2022-08-21 MED ORDER — IOHEXOL 350 MG/ML SOLN
75.0000 mL | Freq: Once | INTRAVENOUS | Status: AC | PRN
  Administered 2022-08-21: 75 mL via INTRAVENOUS

## 2022-08-21 MED ORDER — METOCLOPRAMIDE HCL 5 MG/ML IJ SOLN
10.0000 mg | Freq: Once | INTRAMUSCULAR | Status: AC
  Administered 2022-08-21: 10 mg via INTRAVENOUS
  Filled 2022-08-21: qty 2

## 2022-08-21 NOTE — ED Provider Notes (Signed)
Care of patient assumed from Dr. Waverly Ferrari.  Patient presents for nausea, vomiting, diarrhea.  Additional symptoms include tooth ache and left upper quadrant abdominal pain.  She is currently awaiting CT scan. Physical Exam  BP (!) 140/88   Pulse (!) 56   Temp 98.3 F (36.8 C) (Oral)   Resp 19   Ht 6\' 2"  (1.88 m)   Wt 127 kg   SpO2 98%   BMI 35.95 kg/m   Physical Exam Vitals and nursing note reviewed.  Constitutional:      General: She is not in acute distress.    Appearance: Normal appearance. She is not ill-appearing, toxic-appearing or diaphoretic.  HENT:     Head: Normocephalic and atraumatic.     Right Ear: External ear normal.     Left Ear: External ear normal.     Nose: Nose normal.     Mouth/Throat:     Mouth: Mucous membranes are moist.  Eyes:     General: No scleral icterus.    Extraocular Movements: Extraocular movements intact.  Cardiovascular:     Rate and Rhythm: Normal rate and regular rhythm.  Pulmonary:     Effort: Pulmonary effort is normal. No respiratory distress.  Abdominal:     General: There is no distension.     Palpations: Abdomen is soft.  Musculoskeletal:        General: Normal range of motion.     Cervical back: Normal range of motion.     Right lower leg: No edema.     Left lower leg: No edema.  Skin:    General: Skin is warm and dry.  Neurological:     General: No focal deficit present.     Mental Status: She is alert and oriented to person, place, and time.  Psychiatric:        Mood and Affect: Mood normal.        Behavior: Behavior normal.     Procedures  Procedures  ED Course / MDM    Medical Decision Making Amount and/or Complexity of Data Reviewed Labs: ordered. Radiology: ordered.  Risk Prescription drug management.   CT scan shows no acute findings.  On assessment, patient sitting on ED stretcher.  She reports that symptoms have improved.  She has been able to tolerate p.o. intake while in the ED.  Patient does feel  comfortable with discharge home at this time.  She was advised to continue supportive care with replacement of fluid losses, Zofran as needed, and Imodium as needed.  Patient was discharged in stable condition.       Godfrey Pick, MD 08/21/22 [phone]

## 2022-08-21 NOTE — ED Triage Notes (Signed)
Pt complaining of a toothache for the last 24 hours, has been vomiting and having diarrhea since.

## 2022-08-21 NOTE — Discharge Instructions (Addendum)
There were prescriptions sent to your pharmacy:  -Peridex rinses can help with gingivitis.  Swish and spit with this twice daily after brushing your teeth.   -Zofran is a medication to treat nausea.  Take as needed.  Take over-the-counter Imodium as needed for diarrhea.  Ensure that you drink plenty of fluids to replace fluid losses from vomiting and diarrhea.  Return to the emergency department for any new or worsening symptoms of concern.

## 2022-08-21 NOTE — ED Provider Notes (Signed)
[facility] Provider Note   CSN: [id number] Arrival date & time: 08/21/22  [id number]     History  Chief Complaint  Patient presents with   Emesis    Hector [name] is a 37 y.o. adult.  Patient presents to the emerged department for evaluation of nausea, vomiting, diarrhea.  Symptoms began 24 hours ago, patient reports that they have not been able to hold anything down.  Patient reports precursor to the GI symptoms was right-sided mouth pain and gum swelling.  Patient now experiencing left upper abdominal pain that radiates into the back.       Home Medications Prior to Admission medications   Medication Sig Start Date End Date Taking? Authorizing Provider  bictegravir-emtricitabine-tenofovir AF (BIKTARVY) 50-200-25 MG TABS tablet Take 1 tablet by mouth daily. 04/18/22 04/18/23  [place] Callas, NP  estradiol (ESTRACE) 2 MG tablet Take 1 tablet (2 mg total) by mouth as directed. 2 tabs in AM and 1 tab at [facility] 06/17/22   Shamleffer, Melanie Crazier, MD  fluticasone (FLONASE) 50 MCG/ACT nasal spray Place 1 spray into both nostrils daily. Patient not taking: Reported on 06/16/2022 03/02/22   Raspet, Derry Skill, PA-C  spironolactone (ALDACTONE) 50 MG tablet Take 1 tablet (50 mg total) by mouth 2 (two) times daily. 06/16/22   Shamleffer, Melanie Crazier, MD      Allergies    Tomato, Risperdal [risperidone], and Aspirin    Review of Systems   Review of Systems  Physical Exam Updated Vital Signs BP (!) 140/88   Pulse (!) 56   Temp 98.3 F (36.8 C) (Oral)   Resp 19   Ht 6\' 2"  (1.88 m)   Wt 127 kg   SpO2 98%   BMI 35.95 kg/m  Physical Exam Vitals and nursing note reviewed.  Constitutional:      General: She is not in acute distress.    Appearance: She is well-developed.  HENT:     Head: Normocephalic and atraumatic.     Mouth/Throat:     Mouth: Mucous membranes are moist.     Dentition: Gingival swelling (Right lower) present.      Comments: No facial swelling or obvious dental abscess Eyes:     General: Vision grossly intact. Gaze aligned appropriately.     Extraocular Movements: Extraocular movements intact.     Conjunctiva/sclera: Conjunctivae normal.  Cardiovascular:     Rate and Rhythm: Normal rate and regular rhythm.     Pulses: Normal pulses.     Heart sounds: Normal heart sounds, S1 normal and S2 normal. No murmur heard.    No friction rub. No gallop.  Pulmonary:     Effort: Pulmonary effort is normal. No respiratory distress.     Breath sounds: Normal breath sounds.  Abdominal:     General: Bowel sounds are normal.     Palpations: Abdomen is soft.     Tenderness: There is abdominal tenderness in the left upper quadrant. There is no guarding or rebound.     Hernia: No hernia is present.  Musculoskeletal:        General: No swelling.     Cervical back: Full passive range of motion without pain, normal range of motion and neck supple. No spinous process tenderness or muscular tenderness. Normal range of motion.     Right lower leg: No edema.     Left lower leg: No edema.  Skin:    General: Skin is warm and dry.  Capillary Refill: Capillary refill takes less than 2 seconds.     Findings: No ecchymosis, erythema, rash or wound.  Neurological:     General: No focal deficit present.     Mental Status: She is alert and oriented to person, place, and time.     GCS: GCS eye subscore is 4. GCS verbal subscore is 5. GCS motor subscore is 6.     Cranial Nerves: Cranial nerves 2-12 are intact.     Sensory: Sensation is intact.     Motor: Motor function is intact.     Coordination: Coordination is intact.  Psychiatric:        Attention and Perception: Attention normal.        Mood and Affect: Mood normal.        Speech: Speech normal.        Behavior: Behavior normal.     ED Results / Procedures / Treatments   Labs (all labs ordered are listed, but only abnormal results are displayed) Labs Reviewed   CBC WITH DIFFERENTIAL/PLATELET - Abnormal; Notable for the following components:      Result Value   WBC 11.0 (*)    Hemoglobin 12.9 (*)    HCT 36.3 (*)    All other components within normal limits  COMPREHENSIVE METABOLIC PANEL - Abnormal; Notable for the following components:   Glucose, Bld 124 (*)    All other components within normal limits  URINALYSIS, ROUTINE W REFLEX MICROSCOPIC - Abnormal; Notable for the following components:   Hgb urine dipstick MODERATE (*)    All other components within normal limits  C DIFFICILE QUICK SCREEN W PCR REFLEX    GASTROINTESTINAL PANEL BY PCR, STOOL (REPLACES STOOL CULTURE)  LIPASE, BLOOD  ACETAMINOPHEN LEVEL    EKG None  Radiology No results found.  Procedures Procedures    Medications Ordered in ED Medications  lactated ringers bolus 1,000 mL (1,000 mLs Intravenous New Bag/Given 08/21/22 0538)  ondansetron (ZOFRAN) injection 4 mg (4 mg Intravenous Given 08/21/22 0538)  metoCLOPramide (REGLAN) injection 10 mg (10 mg Intravenous Given 08/21/22 0538)  ibuprofen (ADVIL) tablet 800 mg (800 mg Oral Given 08/21/22 0556)    ED Course/ Medical Decision Making/ A&P                             Medical Decision Making Amount and/or Complexity of Data Reviewed Labs: ordered. Radiology: ordered.  Risk Prescription drug management.   Differential Diagnosis considered includes, but not limited to: Cholelithiasis; cholecystitis; cholangitis; bowel obstruction; esophagitis; gastritis; peptic ulcer disease; pancreatitis; cardiac.   Patient experiencing 24 hours of nausea, vomiting and diarrhea.  Patient now experiencing left upper abdominal pain.  Abdominal exam without guarding, rebound or signs of acute surgical process.  Lab work essentially normal.  White blood cell count slightly elevated at 11.  Otherwise normal liver function test, normal lipase.  Urinalysis without signs of infection.  Will perform CT abdomen and pelvis to further  evaluate.  Patient also complaining of right jaw pain.  No significant swelling present to suggest dental abscess at this time.  There is some soft tissue mucus mucosal and gingival swelling without associated cavities.  Will sign out to oncoming ER physician to follow-up CT scan.        Final Clinical Impression(s) / ED Diagnoses Final diagnoses:  Nausea vomiting and diarrhea  Gingivitis  Pain, dental    Rx / DC Orders ED Discharge Orders  None         Orpah Greek, MD 08/21/22 [phone]

## 2022-08-21 NOTE — ED Notes (Signed)
Pt transported to CT ?

## 2022-08-29 ENCOUNTER — Other Ambulatory Visit (HOSPITAL_COMMUNITY): Payer: Self-pay

## 2022-08-29 ENCOUNTER — Other Ambulatory Visit: Payer: Self-pay

## 2022-09-01 ENCOUNTER — Other Ambulatory Visit (HOSPITAL_COMMUNITY): Payer: Self-pay

## 2022-09-01 ENCOUNTER — Encounter: Payer: Self-pay | Admitting: Infectious Diseases

## 2022-09-01 ENCOUNTER — Ambulatory Visit (INDEPENDENT_AMBULATORY_CARE_PROVIDER_SITE_OTHER): Payer: 59 | Admitting: Infectious Diseases

## 2022-09-01 ENCOUNTER — Other Ambulatory Visit: Payer: Self-pay

## 2022-09-01 VITALS — BP 139/79 | HR 94 | Temp 98.4°F | Wt 302.0 lb

## 2022-09-01 DIAGNOSIS — Z1212 Encounter for screening for malignant neoplasm of rectum: Secondary | ICD-10-CM

## 2022-09-01 DIAGNOSIS — Z789 Other specified health status: Secondary | ICD-10-CM | POA: Diagnosis not present

## 2022-09-01 DIAGNOSIS — B20 Human immunodeficiency virus [HIV] disease: Secondary | ICD-10-CM | POA: Diagnosis not present

## 2022-09-01 MED ORDER — BIKTARVY 50-200-25 MG PO TABS
1.0000 | ORAL_TABLET | Freq: Every day | ORAL | 11 refills | Status: DC
  Filled 2022-09-01 – 2022-09-19 (×2): qty 30, 30d supply, fill #0
  Filled 2022-10-13: qty 30, 30d supply, fill #1
  Filled 2022-11-15: qty 30, 30d supply, fill #2
  Filled 2023-01-06 (×2): qty 30, 30d supply, fill #3
  Filled 2023-01-30: qty 30, 30d supply, fill #4
  Filled 2023-03-09: qty 30, 30d supply, fill #5
  Filled 2023-04-24: qty 30, 30d supply, fill #6
  Filled 2023-05-29: qty 30, 30d supply, fill #7
  Filled 2023-06-16: qty 30, 30d supply, fill #8
  Filled 2023-08-07: qty 30, 30d supply, fill #9

## 2022-09-01 NOTE — Assessment & Plan Note (Signed)
Very well controlled on once daily biktarvy. No concern with access or adherence with medicine. Refills and pertinent labs reviewed today.  No changes in insurance. Enjoys [employer].  No dental needs.  Mood is good, no concerns.   Anal cancer screening discussed and recommended - she will return next week with triage team to self collect a sample. Further recommendations pending results.   RTC in 59m.

## 2022-09-01 NOTE — Patient Instructions (Signed)
Will have you back in 1 week to collect rectal sample like we discussed.   Continue your biktarvy - you have plenty of refills.   Stop by the lab on your way out to update blood work.   Will plan to check in after your surgery in October

## 2022-09-01 NOTE — Progress Notes (Signed)
Name: Hector [name]  DOB: 09/29/1985 MRN: [id number] PCP: Patient, No Pcp Per     Brief Narrative:  Hector [name] is a 37 y.o. adult with HIV 2016.  In care initially at [facility].  A few med lapses over the years but has been consistently well controlled on [place] since January 2020. CD4 nadir unknown (>200 per report). HIV Risk: sexual  History of OIs: none Intake Labs: 2018 Hep B sAg (-), sAb (+), cAb (-); Hep A (-), Hep C (-) Quantiferon (-) HLA B*5701 (-) G6PD: ()   Previous Regimens: Prezcobix + Truvada 2016 Triumeq 2018 Biktarvy 2020  Genotypes:   Subjective:   Chief Complaint  Patient presents with   Follow-up     HPI: Hector [name] is here today for routine HIV care and follow up. Her daughter joins us today.  [facility] and has consultation coming up. She is excited about this. Husband is on the road a lot and working on setting up some help for recovery with her 2 small children. She has no concerns today. [facility] with [facility] and finds them very helpful.       04/24/2020   10:39 AM  Depression screen PHQ 2/9  Decreased Interest 0  Down, Depressed, Hopeless 0  PHQ - 2 Score 0    Review of Systems  Constitutional:  Negative for chills, fever, malaise/fatigue and weight loss.  HENT:  Negative for sore throat.   Respiratory:  Negative for cough, sputum production and shortness of breath.   Cardiovascular: Negative.   Gastrointestinal:  Negative for abdominal pain, diarrhea and vomiting.  Musculoskeletal:  Negative for joint pain, myalgias and neck pain.  Skin:  Negative for rash.  Neurological:  Positive for headaches.  Psychiatric/Behavioral:  Negative for depression and substance abuse. The patient is not nervous/anxious.     Past Medical History:  Diagnosis Date   Depression    Headache    History of fractured vertebra april 8th Diagnosed   Larey SeatFell out of a tree while  climbing it.   Human immunodeficiency virus I infection 2016   Hypertension    Immune deficiency disorder    Mental disorder    Myocardial infarction     Outpatient Medications Prior to Visit  Medication Sig Dispense Refill   chlorhexidine (PERIDEX) 0.12 % solution Use as directed 15 mLs in the mouth or throat 2 (two) times daily. 120 mL 0   estradiol (ESTRACE) 2 MG tablet Take 1 tablet (2 mg total) by mouth as directed. 2 tabs in AM and 1 tab at PM 270 tablet 2   ondansetron (ZOFRAN-ODT) 4 MG disintegrating tablet Take 1 tablet (4 mg total) by mouth every 8 (eight) hours as needed for nausea or vomiting. 20 tablet 0   spironolactone (ALDACTONE) 50 MG tablet Take 1 tablet (50 mg total) by mouth 2 (two) times daily. 180 tablet 2   bictegravir-emtricitabine-tenofovir AF (BIKTARVY) 50-200-25 MG TABS tablet Take 1 tablet by mouth daily. 30 tablet 5   fluticasone (FLONASE) 50 MCG/ACT nasal spray Place 1 spray into both nostrils daily. (Patient not taking: Reported on 06/16/2022) 16 g 0   Facility-Administered Medications Prior to Visit  Medication Dose Route Frequency Provider Last Rate Last Admin   estradiol cypionate (DEPO-ESTRADIOL) 5 MG/ML injection 1.5 mg  1.5 mg Intramuscular Q28 days Romero BellingEllison, Sean, MD   1.5 mg at 12/03/19 1352     Allergies  Allergen Reactions  Tomato Anaphylaxis and Rash   Risperdal [Risperidone] Nausea And Vomiting   Aspirin Itching    Social History   Tobacco Use   Smoking status: Never   Smokeless tobacco: Never  Vaping Use   Vaping Use: Never used  Substance Use Topics   Alcohol use: No   Drug use: No    Family History  Problem Relation Age of Onset   CAD Mother 79       MI   Endocrine Disorder Neg Hx     Social History   Substance and Sexual Activity  Sexual Activity Never   Comment: DECLINED CONDOMS     Objective:   Vitals:   09/01/22 1358  BP: 139/79  Pulse: 94  Temp: 98.4 F (36.9 C)  TempSrc: Oral  SpO2: 97%  Weight: (!)  302 lb (137 kg)   Body mass index is 38.77 kg/m.   Physical Exam Vitals reviewed.  Constitutional:      Appearance: She is well-developed.     Comments: Seated comfortably in chair.   HENT:     Mouth/Throat:     Mouth: No oral lesions.     Dentition: Normal dentition. No dental abscesses.     Pharynx: No oropharyngeal exudate.  Cardiovascular:     Rate and Rhythm: Normal rate and regular rhythm.     Heart sounds: Normal heart sounds.  Pulmonary:     Effort: Pulmonary effort is normal.     Breath sounds: Normal breath sounds.  Abdominal:     General: There is no distension.     Palpations: Abdomen is soft.     Tenderness: There is no abdominal tenderness.  Lymphadenopathy:     Cervical: No cervical adenopathy.  Skin:    General: Skin is warm and dry.     Findings: No rash.  Neurological:     Mental Status: She is alert and oriented to person, place, and time.  Psychiatric:        Judgment: Judgment normal.     Comments: In good spirits today and engaged in care discussion     Lab Results Lab Results  Component Value Date   WBC 11.0 (H) 08/21/2022   HGB 12.9 (L) 08/21/2022   HCT 36.3 (L) 08/21/2022   MCV 84.2 08/21/2022   PLT 216 08/21/2022    Lab Results  Component Value Date   CREATININE 0.77 08/21/2022   BUN 8 08/21/2022   NA 136 08/21/2022   K 3.7 08/21/2022   CL 101 08/21/2022   CO2 26 08/21/2022    Lab Results  Component Value Date   ALT 20 08/21/2022   AST 17 08/21/2022   ALKPHOS 66 08/21/2022   BILITOT 0.4 08/21/2022    Lab Results  Component Value Date   CHOL 143 02/25/2021   HDL 32 (L) 02/25/2021   LDLCALC 87 02/25/2021   TRIG 144 02/25/2021   CHOLHDL 4.5 02/25/2021   HIV 1 RNA Quant  Date Value  02/25/2021 348 Copies/mL (H)  04/24/2020 <20 Copies/mL  08/30/2018 39 copies/mL (H)   CD4 T Cell Abs (/uL)  Date Value  02/25/2021 839  08/30/2018 650  06/08/2018 440     Assessment & Plan:   Problem List Items Addressed This  Visit       Unprioritized   Human immunodeficiency virus I infection - Primary    Very well controlled on once daily biktarvy. No concern with access or adherence with medicine. Refills and pertinent labs reviewed today.  No  changes in insurance. Enjoys [employer].  No dental needs.  Mood is good, no concerns.   Anal cancer screening discussed and recommended - she will return next week with triage team to self collect a sample. Further recommendations pending results.   RTC in 45m.       Relevant Medications   bictegravir-emtricitabine-tenofovir AF (BIKTARVY) 50-200-25 MG TABS tablet   Other Relevant Orders   RPR   HIV 1 RNA quant-no reflex-bld   T-helper cells (CD4) count   Transgender    Surgical consult coming up for top and bottom surgery at [facility] clinic. Happy to forward a letter with current lab assessment from today to help with surgical clearance.  She sees endocrinology for HRT.       Other Visit Diagnoses     Screening for rectal cancer       Relevant Orders   Cytology - PAP      Labs reviewed from other providers colleted earlier this year x 4  Rexene Alberts, MSN, NP-C [facility] Pager: [phone] Office: [phone]  09/01/22  3:15 PM

## 2022-09-01 NOTE — Assessment & Plan Note (Addendum)
Surgical consult coming up for top and bottom surgery at [facility] clinic. Happy to forward a letter with current lab assessment from today to help with surgical clearance.  She sees endocrinology for HRT.

## 2022-09-02 LAB — T-HELPER CELLS (CD4) COUNT (NOT AT ARMC)
CD4 % Helper T Cell: 34 % (ref 33–65)
CD4 T Cell Abs: 1053 /uL (ref 400–1790)

## 2022-09-05 ENCOUNTER — Telehealth: Payer: Self-pay

## 2022-09-05 NOTE — Telephone Encounter (Signed)
Patient made aware of all results and reminded  of upcoming nurse visit.   Valarie Cones, LPN

## 2022-09-05 NOTE — Telephone Encounter (Signed)
-----   Message from [name], NP sent at 09/05/2022 12:22 PM EDT ----- Please let Hector [name] know her viral load remains undetectable, her immune system / CD4 count is in a very healthy range at 1053, and syphilis test is only showing an antibody / memory of old infection that she had 2+ years ago (no concern for new infection and nothing to do),   No changes in her plan - please remind her of her appt with triage for anal cancer screening later this week for self-collected pap smear. Order is in.

## 2022-09-06 LAB — HIV-1 RNA QUANT-NO REFLEX-BLD
HIV 1 RNA Quant: NOT DETECTED Copies/mL
HIV-1 RNA Quant, Log: NOT DETECTED Log cps/mL

## 2022-09-06 LAB — T PALLIDUM AB: T Pallidum Abs: POSITIVE — AB

## 2022-09-06 LAB — RPR TITER: RPR Titer: 1:4 {titer} — ABNORMAL HIGH

## 2022-09-06 LAB — RPR: RPR Ser Ql: REACTIVE — AB

## 2022-09-08 ENCOUNTER — Ambulatory Visit: Payer: 59

## 2022-09-19 ENCOUNTER — Other Ambulatory Visit: Payer: Self-pay

## 2022-09-20 ENCOUNTER — Other Ambulatory Visit (HOSPITAL_COMMUNITY): Payer: Self-pay

## 2022-09-21 ENCOUNTER — Other Ambulatory Visit (HOSPITAL_COMMUNITY): Payer: Self-pay

## 2022-09-22 ENCOUNTER — Other Ambulatory Visit (HOSPITAL_COMMUNITY): Payer: Self-pay

## 2022-09-22 ENCOUNTER — Other Ambulatory Visit: Payer: Self-pay

## 2022-09-24 ENCOUNTER — Ambulatory Visit
Admission: RE | Admit: 2022-09-24 | Discharge: 2022-09-24 | Disposition: A | Discharge status: Home / Self Care | Payer: 59 | Source: Ambulatory Visit

## 2022-09-24 NOTE — ED Notes (Addendum)
Pt sent to ed for evaluation  of seizures per Cheri Rous NP.   Pt offered EMS for transport but declined.

## 2022-10-13 ENCOUNTER — Other Ambulatory Visit (HOSPITAL_COMMUNITY): Payer: Self-pay

## 2022-10-20 ENCOUNTER — Other Ambulatory Visit (HOSPITAL_COMMUNITY): Payer: Self-pay

## 2022-11-07 ENCOUNTER — Other Ambulatory Visit: Payer: Self-pay | Admitting: Family

## 2022-11-07 ENCOUNTER — Ambulatory Visit: Payer: Self-pay

## 2022-11-07 DIAGNOSIS — M25571 Pain in right ankle and joints of right foot: Secondary | ICD-10-CM

## 2022-11-14 ENCOUNTER — Ambulatory Visit (HOSPITAL_COMMUNITY)
Admission: EM | Admit: 2022-11-14 | Discharge: 2022-11-14 | Disposition: A | Discharge status: Home / Self Care | Payer: 59 | Attending: Physician Assistant | Admitting: Physician Assistant

## 2022-11-14 ENCOUNTER — Other Ambulatory Visit: Payer: Self-pay

## 2022-11-14 ENCOUNTER — Encounter (HOSPITAL_COMMUNITY): Payer: Self-pay | Admitting: *Deleted

## 2022-11-14 DIAGNOSIS — K644 Residual hemorrhoidal skin tags: Secondary | ICD-10-CM | POA: Diagnosis not present

## 2022-11-14 MED ORDER — HYDROCORTISONE (PERIANAL) 2.5 % EX CREA: 1.0000 | TOPICAL_CREAM | Freq: Two times a day (BID) | CUTANEOUS | 0 refills | Status: DC

## 2022-11-14 NOTE — ED Provider Notes (Signed)
[facility]    CSN: [id number] Arrival date & time: 11/14/22  1044      History   Chief Complaint Chief Complaint  Patient presents with   Hemorrhoids    HPI Hector [name] is a 37 y.o. adult.   Patient comes today with a 2-week history of hemorrhoids.  Patient reports that this is painful with pain rated 9 on a 0-10 pain scale, described as a burning sensation, no alleviating factors identified.  Has been using over-the-counter Preparation H as well as sitz bath's with temporary improvement of symptoms.  Has had hemorrhoids in the past but generally these resolve quickly.  Denies any associated constipation, difficulty passing bowel movement, abdominal pain, fever, nausea, vomiting.  Did have 1 episode of bright red blood on toilet tissue when symptoms first began but has not had any recurrent symptoms since that time and denies any current melena or hematochezia has never seen colorectal surgery or had any kind of hemorrhoid procedure.  Does report a history of anal receptive intercourse but is not sexually active in a while.    Past Medical History:  Diagnosis Date   Depression    Headache    History of fractured vertebra april 8th Diagnosed   Larey Seat out of a tree while climbing it.   Human immunodeficiency virus I infection (HCC) 2016   Hypertension    Immune deficiency disorder (HCC)    Mental disorder    Myocardial infarction [facility])     Patient Active Problem List   Diagnosis Date Noted   Elevated blood pressure reading 03/08/2022   Transgender 06/14/2018   Hx of neurosyphilis 09/16/2016   Human immunodeficiency virus I infection (HCC) 07/20/2016   Hepatitis B immune 07/18/2016   Insomnia related to another mental disorder 10/04/2011   Schizoaffective disorder, bipolar type (HCC) 10/03/2011    Class: Acute    Past Surgical History:  Procedure Laterality Date   NO PAST SURGERIES         Home Medications    Prior to Admission medications    Medication Sig Start Date End Date Taking? Authorizing Provider  bictegravir-emtricitabine-tenofovir AF (BIKTARVY) 50-200-25 MG TABS tablet Take 1 tablet by mouth daily. 09/01/22 09/01/23 Yes [name], NP  estradiol (ESTRACE) 2 MG tablet Take 1 tablet (2 mg total) by mouth as directed. 2 tabs in AM and 1 tab at PM 06/17/22  Yes Shamleffer, Konrad Dolores, MD  hydrocortisone (ANUSOL-HC) 2.5 % rectal cream Place 1 Application rectally 2 (two) times daily. 11/14/22  Yes Ilyaas Musto, Noberto Retort, PA-C  spironolactone (ALDACTONE) 50 MG tablet Take 1 tablet (50 mg total) by mouth 2 (two) times daily. 06/16/22  Yes Shamleffer, Konrad Dolores, MD    Family History Family History  Problem Relation Age of Onset   CAD Mother 53       MI   Endocrine Disorder Neg Hx     Social History Social History   Tobacco Use   Smoking status: Never   Smokeless tobacco: Never  Vaping Use   Vaping Use: Never used  Substance Use Topics   Alcohol use: No   Drug use: No     Allergies   Tomato, Risperdal [risperidone], and Aspirin   Review of Systems Review of Systems  Constitutional:  Negative for activity change, appetite change, fatigue and fever.  Gastrointestinal:  Positive for rectal pain. Negative for abdominal pain, anal bleeding, blood in stool, constipation, diarrhea, nausea and vomiting.  Musculoskeletal:  Negative for arthralgias and myalgias.  Skin:  Negative for color change and wound.     Physical Exam Triage Vital Signs ED Triage Vitals  Enc Vitals Group     BP 11/14/22 1314 131/81     Pulse Rate 11/14/22 1314 67     Resp 11/14/22 1314 18     Temp 11/14/22 1314 98.9 F (37.2 C)     Temp src --      SpO2 11/14/22 1314 98 %     Weight --      Height --      Head Circumference --      Peak Flow --      Pain Score 11/14/22 1311 9     Pain Loc --      Pain Edu? --      Excl. in GC? --    No data found.  Updated Vital Signs BP 131/81   Pulse 67   Temp 98.9 F (37.2 C)    Resp 18   SpO2 98%   Visual Acuity Right Eye Distance:   Left Eye Distance:   Bilateral Distance:    Right Eye Near:   Left Eye Near:    Bilateral Near:     Physical Exam Vitals reviewed. Exam conducted with a chaperone present.  Constitutional:      General: She is awake.     Appearance: Normal appearance. She is well-developed. She is not ill-appearing.     Comments: Appears stated age in no acute distress sitting comfortably in exam room  HENT:     Head: Normocephalic and atraumatic.  Cardiovascular:     Rate and Rhythm: Normal rate and regular rhythm.     Heart sounds: Normal heart sounds, S1 normal and S2 normal. No murmur heard. Pulmonary:     Effort: Pulmonary effort is normal.     Breath sounds: Normal breath sounds. No stridor. No wheezing, rhonchi or rales.     Comments: Clear to auscultation bilaterally Genitourinary:    Rectum: Mass and external hemorrhoid present. No anal fissure or internal hemorrhoid. Normal anal tone.     Comments: 3 cm x 1 cm external hemorrhoid without evidence of thrombosis noted 9 o'clock position.  1 cm condyloma with central ulceration noted on hemorrhoid.  Geraldine Contras, CMA present as chaperone during exam. Neurological:     Mental Status: She is alert.  Psychiatric:        Behavior: Behavior is cooperative.      UC Treatments / Results  Labs (all labs ordered are listed, but only abnormal results are displayed) Labs Reviewed - No data to display  EKG   Radiology No results found.  Procedures Procedures (including critical care time)  Medications Ordered in UC Medications - No data to display  Initial Impression / Assessment and Plan / UC Course  I have reviewed the triage vital signs and the nursing notes.  Pertinent labs & imaging results that were available during my care of the patient were reviewed by me and considered in my medical decision making (see chart for details).     Patient is well-appearing, afebrile,  nontoxic, nontachycardic.  Vital signs and physical exam are reassuring with no indication for emergent evaluation or imaging.  External hemorrhoid was noted on exam and patient was started on hydrocortisone cream to manage symptoms.  I am concerned that they might also have warts given presentation of lesion associated with hemorrhoid.  Recommended follow-up with colorectal surgery for further evaluation and management was given contact information for  local provider with instruction to call to schedule appointment.  Also recommended follow-up with infectious disease as soon as possible for additional evaluation.  Discussed that if there are any worsening symptoms they need to be seen immediately.  Strict return precautions given.  All questions answered to patient satisfaction.   Final Clinical Impressions(s) / UC Diagnoses   Final diagnoses:  External hemorrhoid     Discharge Instructions      I believe that you have an external hemorrhoid but you might also have a associated anal wart.  Please follow-up with your infectious disease provider as soon as possible.  I also want you to follow-up with colorectal surgery.  Call them to schedule an appointment.  We will treat the hemorrhoid with hydrocortisone cream twice daily.  Continue using sitz bath.  Avoid constipation.  If you have any worsening symptoms including increasing pain, additional lesions, difficulty passing a bowel movement, enlarging lesion, fever, nausea, vomiting you should be seen immediately.     ED Prescriptions     Medication Sig Dispense Auth. Provider   hydrocortisone (ANUSOL-HC) 2.5 % rectal cream Place 1 Application rectally 2 (two) times daily. 30 g Ottilia Pippenger, Noberto Retort, PA-C      PDMP not reviewed this encounter.   Jeani Hawking, PA-C 11/14/22 1408

## 2022-11-14 NOTE — Discharge Instructions (Addendum)
I believe that you have an external hemorrhoid but you might also have a associated anal wart.  Please follow-up with your infectious disease provider as soon as possible.  I also want you to follow-up with colorectal surgery.  Call them to schedule an appointment.  We will treat the hemorrhoid with hydrocortisone cream twice daily.  Continue using sitz bath.  Avoid constipation.  If you have any worsening symptoms including increasing pain, additional lesions, difficulty passing a bowel movement, enlarging lesion, fever, nausea, vomiting you should be seen immediately.

## 2022-11-14 NOTE — ED Triage Notes (Signed)
Pt reports having Hemorrhoids for several weeks. Pt has used OTC for hemorrhoids but now has a new bump on rectum.

## 2022-11-15 ENCOUNTER — Other Ambulatory Visit: Payer: Self-pay

## 2022-11-15 ENCOUNTER — Ambulatory Visit (INDEPENDENT_AMBULATORY_CARE_PROVIDER_SITE_OTHER): Payer: 59 | Admitting: Infectious Diseases

## 2022-11-15 ENCOUNTER — Other Ambulatory Visit (HOSPITAL_COMMUNITY): Payer: Self-pay

## 2022-11-15 ENCOUNTER — Encounter: Payer: Self-pay | Admitting: Internal Medicine

## 2022-11-15 ENCOUNTER — Other Ambulatory Visit (HOSPITAL_COMMUNITY)
Admission: RE | Admit: 2022-11-15 | Discharge: 2022-11-15 | Disposition: A | Discharge status: Home / Self Care | Payer: 59 | Source: Ambulatory Visit | Attending: Infectious Diseases | Admitting: Infectious Diseases

## 2022-11-15 ENCOUNTER — Encounter: Payer: Self-pay | Admitting: Infectious Diseases

## 2022-11-15 VITALS — BP 134/87 | HR 82 | Temp 98.2°F | Resp 16 | Wt 298.6 lb

## 2022-11-15 DIAGNOSIS — Z113 Encounter for screening for infections with a predominantly sexual mode of transmission: Secondary | ICD-10-CM | POA: Diagnosis not present

## 2022-11-15 DIAGNOSIS — K645 Perianal venous thrombosis: Secondary | ICD-10-CM | POA: Insufficient documentation

## 2022-11-15 DIAGNOSIS — B20 Human immunodeficiency virus [HIV] disease: Secondary | ICD-10-CM | POA: Diagnosis not present

## 2022-11-15 MED ORDER — HYDROCORTISONE (PERIANAL) 2.5 % EX CREA
1.0000 | TOPICAL_CREAM | Freq: Two times a day (BID) | CUTANEOUS | 0 refills | Status: DC
  Filled 2022-11-15: qty 30, 10d supply, fill #0

## 2022-11-15 NOTE — Progress Notes (Signed)
Name: Hector [name]  DOB: February 26, 1986 MRN: [id number] PCP: Patient, No Pcp Per     Brief Narrative:  [name] is a 37 y.o. adult with HIV 2016.  In care initially at [facility] clinic.  A few med lapses over the years but has been consistently well controlled on [place] since January 2020. CD4 nadir unknown (>200 per report). HIV Risk: sexual  History of OIs: none Intake Labs: 2018 Hep B sAg (-), sAb (+), cAb (-); Hep A (-), Hep C (-) Quantiferon (-) HLA B*5701 (-) G6PD: ()   Previous Regimens: Prezcobix + Truvada 2016 Triumeq 2018 Biktarvy 2020  Genotypes:   Subjective:   Chief Complaint  Patient presents with   Genital Warts    Bump/hemorrohoid on rectum x 2 weeks. Patient went to UC yesterday- given to creams to apply to rectum.      HPI: Hector [name] is here today for evaluation of genital wart/ hemorrhoid. Was seen in the Urgent care yesterday for this problem. Reports a 2 week history of flared hemorrhoids that are very painful. Used OTC preparation H. And sitz baths. Given 2.5% topical steroid cream yesterday and instructed to follow up here. She has not picked up her steroid cream rx yet.   No receptive anal sex in 14m. She does not have trouble with constipation. She does sit on the toilet looking at her phone for long periods of time when she goes to the bathroom 1-2 times a day. Describes no stool balls, but more like smooth banana like stools. Drinks plenty of water. The bleeding has stopped.       11/15/2022    8:48 AM  Depression screen PHQ 2/9  Decreased Interest 0  Down, Depressed, Hopeless 0  PHQ - 2 Score 0    Review of Systems  Gastrointestinal:  Positive for blood in stool. Negative for abdominal pain, constipation and diarrhea.    Past Medical History:  Diagnosis Date   Depression    Headache    History of fractured vertebra april 8th Diagnosed   Larey Seat out of a tree while climbing it.   Human immunodeficiency virus I infection (HCC)  2016   Hypertension    Immune deficiency disorder (HCC)    Mental disorder    Myocardial infarction [facility])     Outpatient Medications Prior to Visit  Medication Sig Dispense Refill   bictegravir-emtricitabine-tenofovir AF (BIKTARVY) 50-200-25 MG TABS tablet Take 1 tablet by mouth daily. 30 tablet 11   estradiol (ESTRACE) 2 MG tablet Take 1 tablet (2 mg total) by mouth as directed. 2 tabs in AM and 1 tab at PM 270 tablet 2   spironolactone (ALDACTONE) 50 MG tablet Take 1 tablet (50 mg total) by mouth 2 (two) times daily. 180 tablet 2   hydrocortisone (ANUSOL-HC) 2.5 % rectal cream Place 1 Application rectally 2 (two) times daily. 30 g 0   No facility-administered medications prior to visit.     Allergies  Allergen Reactions   Tomato Anaphylaxis and Rash   Risperdal [Risperidone] Nausea And Vomiting   Aspirin Itching    Social History   Tobacco Use   Smoking status: Never   Smokeless tobacco: Never  Vaping Use   Vaping Use: Never used  Substance Use Topics   Alcohol use: No   Drug use: No    Family History  Problem Relation Age of Onset   CAD Mother 2       MI   Endocrine Disorder Neg Hx  Social History   Substance and Sexual Activity  Sexual Activity Never   Comment: DECLINED CONDOMS     Objective:   Vitals:   11/15/22 0845  BP: 134/87  Pulse: 82  Resp: 16  Temp: 98.2 F (36.8 C)  TempSrc: Oral  SpO2: 99%  Weight: 298 lb 9.6 oz (135.4 kg)   Body mass index is 38.34 kg/m.   Physical Exam Vitals reviewed. Exam conducted with a chaperone present.  Genitourinary:      Comments: Flesh colored, smooth enlarged nodule that is not terribly firm. No wart-like findings. Small ulceration in the center with surrounding white macerated skin. Non-pulsatile. Mild TTP.  Skin:    General: Skin is warm and dry.     Lab Results Lab Results  Component Value Date   WBC 11.0 (H) 08/21/2022   HGB 12.9 (L) 08/21/2022   HCT 36.3 (L) 08/21/2022   MCV 84.2  08/21/2022   PLT 216 08/21/2022    Lab Results  Component Value Date   CREATININE 0.77 08/21/2022   BUN 8 08/21/2022   NA 136 08/21/2022   K 3.7 08/21/2022   CL 101 08/21/2022   CO2 26 08/21/2022    Lab Results  Component Value Date   ALT 20 08/21/2022   AST 17 08/21/2022   ALKPHOS 66 08/21/2022   BILITOT 0.4 08/21/2022    Lab Results  Component Value Date   CHOL 143 02/25/2021   HDL 32 (L) 02/25/2021   LDLCALC 87 02/25/2021   TRIG 144 02/25/2021   CHOLHDL 4.5 02/25/2021   HIV 1 RNA Quant (Copies/mL)  Date Value  09/01/2022 Not Detected  02/25/2021 348 (H)  04/24/2020 <20   CD4 T Cell Abs (/uL)  Date Value  09/01/2022 1,053  02/25/2021 839  08/30/2018 650     Assessment & Plan:   Problem List Items Addressed This Visit       Unprioritized   Human immunodeficiency virus I infection (HCC)    FU scheduled in October - spent time troubleshooting medication delivery with Lupita Leash. Supplied with 2 weeks of samples today to bridge until next fill. Anorectal cancer screening next visit as scheduled.       External hemorrhoid, thrombosed - Primary    Exam most c/w thrombosed external hemorrhoid with ulceration. Non-bleeding today. She has not picked up the anusol yet - will get this sent to [facility] to be mailed for her. Recommended to stay off the toilet for long periods of time to avoid this from coming back.  OTC stool softeners.  GI referral placed as I worry this may need to be drained.       Relevant Orders   Ambulatory referral to Gastroenterology   Routine screening for STI (sexually transmitted infection)    She would like some reassurance with rectal swab today to ensure no STI contributing.       Relevant Orders   Cytology (oral, anal, urethral) ancillary only   Total Encounter Time: 25 min spent in face to face counsel, exam, coordination of medication shipment tracking, review of plan for treatment of acute hemorrhoid and chronic care management for HIV  and preventative screenings.   Rexene Alberts, MSN, NP-C [facility] Pager: [phone] Office: [phone]  11/15/22  9:37 AM

## 2022-11-15 NOTE — Assessment & Plan Note (Signed)
Exam most c/w thrombosed external hemorrhoid with ulceration. Non-bleeding today. She has not picked up the anusol yet - will get this sent to [facility] to be mailed for her. Recommended to stay off the toilet for long periods of time to avoid this from coming back.  OTC stool softeners.  GI referral placed as I worry this may need to be drained.

## 2022-11-15 NOTE — Assessment & Plan Note (Addendum)
FU scheduled in October - spent time troubleshooting medication delivery with Lupita Leash. Supplied with 2 weeks of samples today to bridge until next fill. Anorectal cancer screening next visit as scheduled.

## 2022-11-15 NOTE — Patient Instructions (Addendum)
I believe you have a hemorrhoid that may have clotted.   You may need to get it drained - I have put in a referral for GI for them to reach you. If you don't hear from them this week call to schedule an appointment.  Address: [address], [place], [place] [address] Phone: [phone]  Continue to use the baths and the prescription cream that I sent in to [facility] to be mailed to you. TWICE a day.   I think you need to limit how long you are on the toilet to prevent this from happening again.  Squatty potty may be helpful.   Push for the workers comp for work to get your lost wages.

## 2022-11-15 NOTE — Assessment & Plan Note (Signed)
She would like some reassurance with rectal swab today to ensure no STI contributing.

## 2022-11-16 ENCOUNTER — Telehealth: Payer: Self-pay

## 2022-11-16 ENCOUNTER — Other Ambulatory Visit (HOSPITAL_COMMUNITY): Payer: Self-pay

## 2022-11-16 ENCOUNTER — Other Ambulatory Visit: Payer: Self-pay

## 2022-11-16 DIAGNOSIS — K645 Perianal venous thrombosis: Secondary | ICD-10-CM

## 2022-11-16 LAB — CYTOLOGY, (ORAL, ANAL, URETHRAL) ANCILLARY ONLY
Chlamydia: POSITIVE — AB
Comment: NEGATIVE
Comment: NORMAL
Neisseria Gonorrhea: NEGATIVE

## 2022-11-16 MED ORDER — DOXYCYCLINE HYCLATE 100 MG PO TABS: 100.0000 mg | ORAL_TABLET | Freq: Two times a day (BID) | ORAL | 0 refills | Status: AC

## 2022-11-16 MED ORDER — DOXYCYCLINE HYCLATE 100 MG PO TABS
100.0000 mg | ORAL_TABLET | Freq: Two times a day (BID) | ORAL | 0 refills | Status: DC
  Filled 2022-11-16: qty 14, 7d supply, fill #0

## 2022-11-16 NOTE — Telephone Encounter (Signed)
Patient aware of results and voiced her understanding.   Hector [name], CMA  

## 2022-11-16 NOTE — Addendum Note (Signed)
Addended by: [name] on: 11/16/2022 03:57 PM   Modules accepted: Orders

## 2022-11-16 NOTE — Telephone Encounter (Signed)
-----   Message from [name], NP sent at 11/16/2022  3:56 PM EDT ----- Please call Hector [name] to let her know her rectal test returned positive for chlamydia.  Will send in 7 days of doxycycline for her.  Take with food to help with nausea. One tab twice a day until gone.  I don't think this is necessarily explaining her hemorrhoid problem but may be causing inflammation in the rectal tissue to a degree. Continue the topical hydrocortisone cream I sent in for her too.

## 2022-11-16 NOTE — Progress Notes (Signed)
Please call Hector [name] to let her know her rectal test returned positive for chlamydia.  Will send in 7 days of doxycycline for her.  Take with food to help with nausea. One tab twice a day until gone.  I don't think this is necessarily explaining her hemorrhoid problem but may be causing inflammation in the rectal tissue to a degree. Continue the topical hydrocortisone cream I sent in for her too.

## 2022-11-17 ENCOUNTER — Other Ambulatory Visit: Payer: Self-pay | Admitting: Pharmacist

## 2022-11-17 DIAGNOSIS — B20 Human immunodeficiency virus [HIV] disease: Secondary | ICD-10-CM

## 2022-11-17 MED ORDER — BIKTARVY 50-200-25 MG PO TABS: 1.0000 | ORAL_TABLET | Freq: Every day | ORAL | 0 refills | Status: AC

## 2022-11-17 NOTE — Progress Notes (Signed)
Medication Samples have been provided to the patient.  Drug name: Biktarvy        Strength: 50/200/25 mg       Qty: 2 bottles (14 tablets)  LOT: [id number]   Exp.Date: 12/2024  Dosing instructions: Take one tablet by mouth once daily  The patient has been instructed regarding the correct time, dose, and frequency of taking this medication, including desired effects and most common side effects.   Carlye Panameno L. Jannette Fogo, PharmD, BCIDP, AAHIVP, CPP Clinical Pharmacist Practitioner Infectious Diseases Clinical Pharmacist [facility] 05/04/2020, 10:07 AM

## 2022-11-18 ENCOUNTER — Ambulatory Visit: Payer: 59 | Admitting: Internal Medicine

## 2022-11-30 ENCOUNTER — Ambulatory Visit: Payer: Self-pay

## 2022-11-30 ENCOUNTER — Other Ambulatory Visit: Payer: Self-pay | Admitting: Nurse Practitioner

## 2022-11-30 DIAGNOSIS — M25571 Pain in right ankle and joints of right foot: Secondary | ICD-10-CM

## 2022-11-30 DIAGNOSIS — M79671 Pain in right foot: Secondary | ICD-10-CM

## 2022-12-06 ENCOUNTER — Other Ambulatory Visit (HOSPITAL_COMMUNITY): Payer: Self-pay

## 2022-12-09 ENCOUNTER — Other Ambulatory Visit (HOSPITAL_COMMUNITY): Payer: Self-pay

## 2022-12-12 ENCOUNTER — Encounter (HOSPITAL_COMMUNITY): Payer: Self-pay

## 2022-12-12 ENCOUNTER — Other Ambulatory Visit (HOSPITAL_COMMUNITY): Payer: Self-pay

## 2023-01-06 ENCOUNTER — Ambulatory Visit: Payer: Medicaid Other | Admitting: Internal Medicine

## 2023-01-06 ENCOUNTER — Other Ambulatory Visit (HOSPITAL_COMMUNITY): Payer: Self-pay

## 2023-01-06 ENCOUNTER — Other Ambulatory Visit: Payer: Self-pay

## 2023-01-06 NOTE — Progress Notes (Deleted)
Name: Hector [name]  MRN/ DOB: [id number], 1986/02/18    Age/ Sex: 37 y.o., adult     PCP: Patient, No Pcp Per   Reason for Endocrinology Evaluation: Gender dysphoria     Initial Endocrinology Clinic Visit: 07/11/2018    PATIENT IDENTIFIER: Hector [name] is a 37 y.o., adult with a past medical history of HIV, schizoaffective disorder, gender dysphoria. She has followed with [facility] since 07/11/2018 for consultative assistance with management of her gender dysphoria.   HISTORICAL SUMMARY: The patient has a diagnosis of gender dysphoria.  He was started on estrogen replacement therapy in 2020.  Patient was started on dulasteride  through  his previous endocrinologist in 2021   Patient was followed by Dr. Everardo All from 2020 until 2022  SUBJECTIVE:    Today (01/06/2023):  Hector [name] is here for follow-up on gender dysphoria.  Pt is disacidify with results  Denies breast growth  Has slowing of facial hair growth   NO Fh or personal Hx of VTE  Denies breast or ovarian cancer  Quit smoking last year    spironolactone 50 mg BID Increase Estradiol 2 mg, 2 tabs in the morning and 1 tab in the evening   HISTORY:  Past Medical History:  Past Medical History:  Diagnosis Date   Depression    Headache    History of fractured vertebra april 8th Diagnosed   Larey Seat out of a tree while climbing it.   Human immunodeficiency virus I infection (HCC) 2016   Hypertension    Immune deficiency disorder (HCC)    Mental disorder    Myocardial infarction [facility])    Past Surgical History:  Past Surgical History:  Procedure Laterality Date   NO PAST SURGERIES     Social History:  reports that she has never smoked. She has never used smokeless tobacco. She reports that she does not drink alcohol and does not use drugs. Family History:  Family History  Problem Relation Age of Onset   CAD Mother 30       MI   Endocrine Disorder Neg Hx      HOME  MEDICATIONS: Allergies as of 01/06/2023       Reactions   Tomato Anaphylaxis, Rash   Risperdal [risperidone] Nausea And Vomiting   Aspirin Itching        Medication List        Accurate as of January 06, 2023  2:07 PM. If you have any questions, ask your nurse or doctor.          Biktarvy 50-200-25 MG Tabs tablet Generic drug: bictegravir-emtricitabine-tenofovir AF Take 1 tablet by mouth daily.   estradiol 2 MG tablet Commonly known as: ESTRACE Take 1 tablet (2 mg total) by mouth as directed. 2 tabs in AM and 1 tab at PM   hydrocortisone 2.5 % rectal cream Commonly known as: ANUSOL-HC Place 1 Application rectally 2 (two) times daily.   spironolactone 50 MG tablet Commonly known as: ALDACTONE Take 1 tablet (50 mg total) by mouth 2 (two) times daily.          OBJECTIVE:   PHYSICAL EXAM: VS: There were no vitals taken for this visit.   EXAM: General: Pt appears well and is in NAD  Eyes: External eye exam normal without stare, lid lag or exophthalmos.    Neck: General: Supple without adenopathy. Thyroid: Thyroid size normal.  No goiter or nodules appreciated.   Lungs: Clear with good BS bilat with no rales, rhonchi,  or wheezes  Heart: Auscultation: RRR.  Abdomen: soft, nontender  Extremities:  BL LE: No pretibial edema normal  Mental Status: Judgment, insight: Intact Orientation: Oriented to time, place, and person Mood and affect: No depression, anxiety, or agitation     DATA REVIEWED:    Latest Reference Range & Units 06/16/22 07:47  Sodium 135 - 145 mEq/L 138  Potassium 3.5 - 5.1 mEq/L 4.1  Chloride 96 - 112 mEq/L 103  CO2 19 - 32 mEq/L 28  Glucose 70 - 99 mg/dL 161 (H)  BUN 6 - 23 mg/dL 15  Creatinine 0.96 - 0.45 mg/dL 4.09  Calcium 8.4 - 81.1 mg/dL 9.4  GFR >91.47 mL/min 113.34    Latest Reference Range & Units 06/16/22 07:47  WBC 4.0 - 10.5 K/uL 9.1  RBC 4.22 - 5.81 Mil/uL 4.54  Hemoglobin 13.0 - 17.0 g/dL 82.9  HCT 56.2 - 13.0 % 40.5   MCV 78.0 - 100.0 fl 89.3  MCHC 30.0 - 36.0 g/dL 86.5  RDW 78.4 - 69.6 % 13.5  Platelets 150.0 - 400.0 K/uL 237.0    Latest Reference Range & Units 06/16/22 07:47  Testosterone 300.00 - 890.00 ng/dL 295.28    Latest Reference Range & Units 06/16/22 07:47  Estradiol < OR = 39 pg/mL 43 (H)    Old records , labs and images have been reviewed.    ASSESSMENT / PLAN / RECOMMENDATIONS:   Gender Dysphoria :  - M to F -Patient is not satisfied with outcome of hormone affirming therapy -Patient has not been on spironolactone, we discussed starting this today, we discussed risk of frequency as well as dehydration as it is a diuretic -Estrogen level continues to be low but stable over the past year, patient assures me she has been taking 2 tabs of estradiol (prescription is for 1 tablet daily) -Will increase estrogen level as below.  Discussed the risk of increased breast cancer and thromboembolic events with estrogen therapy  Medications   Start spironolactone 50 mg BID Increase Estradiol 2 mg, 2 tabs in the morning and 1 tab in the evening   Follow-up in 4 months   Signed electronically by: Lyndle Herrlich, MD  [facility] End[facility]ology  [facility] [address] [place] [place], [place] [address] Phone: [phone] FAX: [phone]      CC: Patient, No Pcp Per No address on file Phone: None  Fax: None   Return to Endocrinology clinic as below: Future Appointments  Date Time Provider Department Center  01/06/2023  2:40 PM Hector [name], Konrad Dolores, MD [facility] None  02/17/2023  3:00 PM Hilarie Fredrickson, MD [facility]  03/06/2023  3:45 PM Durwin Nora, Gomez Cleverly, NP [facility]

## 2023-01-09 ENCOUNTER — Other Ambulatory Visit: Payer: Self-pay

## 2023-01-09 ENCOUNTER — Other Ambulatory Visit (HOSPITAL_COMMUNITY): Payer: Self-pay

## 2023-01-26 ENCOUNTER — Other Ambulatory Visit (HOSPITAL_COMMUNITY): Payer: Self-pay

## 2023-01-30 ENCOUNTER — Other Ambulatory Visit (HOSPITAL_COMMUNITY): Payer: Self-pay

## 2023-02-15 ENCOUNTER — Encounter: Payer: Self-pay | Admitting: Internal Medicine

## 2023-02-15 ENCOUNTER — Ambulatory Visit (INDEPENDENT_AMBULATORY_CARE_PROVIDER_SITE_OTHER): Payer: Medicaid Other | Admitting: Internal Medicine

## 2023-02-15 VITALS — BP 124/72 | HR 78 | Ht 74.0 in | Wt 300.2 lb

## 2023-02-15 DIAGNOSIS — F640 Transsexualism: Secondary | ICD-10-CM | POA: Diagnosis not present

## 2023-02-15 MED ORDER — SYRINGE (DISPOSABLE) 1 ML MISC: 1.0000 | 2 refills | Status: AC

## 2023-02-15 MED ORDER — ESTRADIOL VALERATE 10 MG/ML IM OIL: 1.0000 mL | TOPICAL_OIL | INTRAMUSCULAR | 2 refills | Status: DC

## 2023-02-15 MED ORDER — SPIRONOLACTONE 50 MG PO TABS: 50.0000 mg | ORAL_TABLET | Freq: Two times a day (BID) | ORAL | 2 refills | Status: DC

## 2023-02-15 MED ORDER — "NEEDLE (DISP) 25G X 1"" MISC": 1.0000 | 2 refills | Status: AC

## 2023-02-15 NOTE — Progress Notes (Signed)
Name: Hector [name]  MRN/ DOB: [id number], 1985/11/16    Age/ Sex: 37 y.o., adult     PCP: Patient, No Pcp Per   Reason for Endocrinology Evaluation: Gender dysphoria     Initial Endocrinology Clinic Visit: 07/11/2018    PATIENT IDENTIFIER: [name] is a 37 y.o., adult with a past medical history of HIV, schizoaffective disorder, gender dysphoria. She has followed with [facility] since 07/11/2018 for consultative assistance with management of her gender dysphoria.   HISTORICAL SUMMARY: The patient has a diagnosis of gender dysphoria.  She was started on estrogen replacement therapy in 2020.  Patient was started on dulasteride  through  her previous endocrinologist in 2021   Patient was followed by Dr. Everardo All from 2020 until 2022  SUBJECTIVE:    Today (02/15/2023):  [name] is here for follow-up on gender dysphoria.   Patient endorses side effects to estradiol with vomiting Unfortunately, the patient did not increase estradiol as prescribed, patient states she is having difficulty getting into the MyChart and her password is being set up  NO change in breast size  Stopped smoking recently Continues with slowing of facial hair growth  Denies constipation or diarrhea  Denies SOB but has fatigue  Unknown snoring    Estradiol 2 mg 2 tabs Qam and 1 tab QPM-taking 1 tablet daily Spironolactone 50 mg BID  -has not taken in a month    HISTORY:  Past Medical History:  Past Medical History:  Diagnosis Date   Depression    Headache    History of fractured vertebra april 8th Diagnosed   Larey Seat out of a tree while climbing it.   Human immunodeficiency virus I infection (HCC) 2016   Hypertension    Immune deficiency disorder (HCC)    Mental disorder    Myocardial infarction [facility])    Past Surgical History:  Past Surgical History:  Procedure Laterality Date   NO PAST SURGERIES     Social History:  reports that she has never smoked. She has  never used smokeless tobacco. She reports that she does not drink alcohol and does not use drugs. Family History:  Family History  Problem Relation Age of Onset   CAD Mother 20       MI   Endocrine Disorder Neg Hx      HOME MEDICATIONS: Allergies as of 02/15/2023       Reactions   Tomato Anaphylaxis, Rash   Risperdal [risperidone] Nausea And Vomiting   Aspirin Itching        Medication List        Accurate as of February 15, 2023  8:28 AM. If you have any questions, ask your nurse or doctor.          Biktarvy 50-200-25 MG Tabs tablet Generic drug: bictegravir-emtricitabine-tenofovir AF Take 1 tablet by mouth daily.   estradiol 2 MG tablet Commonly known as: ESTRACE Take 1 tablet (2 mg total) by mouth as directed. 2 tabs in AM and 1 tab at PM   hydrocortisone 2.5 % rectal cream Commonly known as: ANUSOL-HC Place 1 Application rectally 2 (two) times daily.   meloxicam 7.5 MG tablet Commonly known as: MOBIC Take 1 tablet PO twice a day for 2 weeks and then PRN   spironolactone 50 MG tablet Commonly known as: ALDACTONE Take 1 tablet (50 mg total) by mouth 2 (two) times daily.          OBJECTIVE:   PHYSICAL EXAM: VS: BP 124/72 (  BP Location: Left Arm, Patient Position: Sitting, Cuff Size: Large)   Pulse 78   Ht 6\' 2"  (1.88 m)   Wt (!) 300 lb 3.2 oz (136.2 kg)   SpO2 97%   BMI 38.54 kg/m    EXAM: General: Pt appears well and is in NAD  Eyes: External eye exam normal without stare, lid lag or exophthalmos.    Neck: General: Supple without adenopathy. Thyroid: Thyroid size normal.  No goiter or nodules appreciated.   Lungs: Clear with good BS bilat with no rales, rhonchi, or wheezes  Heart: Auscultation: RRR.  Abdomen: soft, nontender  Extremities:  BL LE: No pretibial edema normal  Mental Status: Judgment, insight: Intact Orientation: Oriented to time, place, and person Mood and affect: No depression, anxiety, or agitation     DATA  REVIEWED:    Latest Reference Range & Units 06/16/22 07:47  Sodium 135 - 145 mEq/L 138  Potassium 3.5 - 5.1 mEq/L 4.1  Chloride 96 - 112 mEq/L 103  CO2 19 - 32 mEq/L 28  Glucose 70 - 99 mg/dL 401 (H)  BUN 6 - 23 mg/dL 15  Creatinine 0.27 - 2.53 mg/dL 6.64  Calcium 8.4 - 40.3 mg/dL 9.4  GFR >47.42 mL/min 113.34    Latest Reference Range & Units 06/16/22 07:47  WBC 4.0 - 10.5 K/uL 9.1  RBC 4.22 - 5.81 Mil/uL 4.54  Hemoglobin 13.0 - 17.0 g/dL 59.5  HCT 63.8 - 75.6 % 40.5  MCV 78.0 - 100.0 fl 89.3  MCHC 30.0 - 36.0 g/dL 43.3  RDW 29.5 - 18.8 % 13.5  Platelets 150.0 - 400.0 K/uL 237.0    Latest Reference Range & Units 06/16/22 07:47  Testosterone 300.00 - 890.00 ng/dL 416.60    Latest Reference Range & Units 06/16/22 07:47  Estradiol < OR = 39 pg/mL 43 (H)     ASSESSMENT / PLAN / RECOMMENDATIONS:   Gender Dysphoria :  - M to F -Due to miscommunication, the patient continues to take estradiol 1 tablet daily instead of 3 tablets daily -I have prescribed spironolactone for 9-month supply but the patient tells me that she has been out over the past month? (The patient should have had enough spironolactone to last her until the end of this month) -Patient is requesting to switch to intramuscular estrogen, patient was trained on injection technique in the office today -No labs today as she has not been taking the appropriate doses of medications -Will follow-up in 3 months and we will repeat labs then -We again discussed the risk of DVT/PE as well as breast cancer, patient has discontinued tobacco use which decreases the risk of VTE's but not necessarily eliminate the risk  Medications   Take spironolactone 50 mg BID Stop estradiol 2 mg tabs Start estrogen Valerate 10 mg (1 mL) every 14 days    Follow-up in 3 months   Signed electronically by: Lyndle Herrlich, MD  [facility] End[facility]ology  [facility] [address] [place] [place], [place]  [address] Phone: [phone] FAX: [phone]      CC: Patient, No Pcp Per No address on file Phone: None  Fax: None   Return to Endocrinology clinic as below: Future Appointments  Date Time Provider Department Center  02/15/2023  8:30 AM Faith Branan, Konrad Dolores, MD [facility] None  02/17/2023  3:00 PM Hilarie Fredrickson, MD [facility]  03/14/2023  3:30 PM Durwin Nora, Gomez Cleverly, NP [facility]

## 2023-02-15 NOTE — Patient Instructions (Signed)
Start Spironolactone 50 mg, 1 tablet twice daily Stop estrogen tablets Start estrogen injections 1 mL every 14 days

## 2023-02-17 ENCOUNTER — Ambulatory Visit: Payer: Medicaid Other | Admitting: Internal Medicine

## 2023-02-27 ENCOUNTER — Other Ambulatory Visit: Payer: Self-pay

## 2023-03-06 ENCOUNTER — Ambulatory Visit: Payer: 59 | Admitting: Infectious Diseases

## 2023-03-09 ENCOUNTER — Other Ambulatory Visit (HOSPITAL_COMMUNITY): Payer: Self-pay

## 2023-03-09 ENCOUNTER — Other Ambulatory Visit (HOSPITAL_COMMUNITY): Payer: Self-pay | Admitting: Pharmacy Technician

## 2023-03-09 NOTE — Progress Notes (Signed)
Specialty Pharmacy Refill Coordination Note  Hector [name] is a 37 y.o. adult contacted today regarding refills of specialty medication(s) Bictegravir-Emtricitab-Tenofov   Patient requested Delivery   Delivery date: 03/14/23   Verified address: [address]  [name]   Medication will be filled on 03/13/23.

## 2023-03-13 ENCOUNTER — Other Ambulatory Visit: Payer: Self-pay

## 2023-03-14 ENCOUNTER — Ambulatory Visit: Payer: Medicaid Other | Admitting: Infectious Diseases

## 2023-04-05 ENCOUNTER — Other Ambulatory Visit: Payer: Self-pay

## 2023-04-10 ENCOUNTER — Other Ambulatory Visit (HOSPITAL_COMMUNITY): Payer: Self-pay

## 2023-04-12 ENCOUNTER — Other Ambulatory Visit: Payer: Self-pay

## 2023-04-24 ENCOUNTER — Other Ambulatory Visit: Payer: Self-pay

## 2023-04-24 ENCOUNTER — Ambulatory Visit (HOSPITAL_COMMUNITY): Payer: Self-pay

## 2023-04-24 ENCOUNTER — Other Ambulatory Visit: Payer: Self-pay | Admitting: Pharmacist

## 2023-04-24 ENCOUNTER — Other Ambulatory Visit (HOSPITAL_COMMUNITY): Payer: Self-pay

## 2023-04-24 NOTE — Progress Notes (Signed)
Specialty Pharmacy Ongoing Clinical Assessment Note  Hector [name] is a 37 y.o. adult who is being followed by the specialty pharmacy service for RxSp HIV   Patient's specialty medication(s) reviewed today: Bictegravir-Emtricitab-Tenofov   Missed doses in the last 4 weeks: 0   Patient/Caregiver did not have any additional questions or concerns.   Therapeutic benefit summary: Patient is achieving benefit   Adverse events/side effects summary: No adverse events/side effects   Patient's therapy is appropriate to: Continue    Goals Addressed             This Visit's Progress    Achieve Undetectable HIV Viral Load < 20       Patient is on track. Patient will maintain adherence.      Comply with lab assessments       Patient is on track. Patient will adhere to provider and/or lab appointments.      Improve or maintain quality of life       Patient is on track. Patient will be monitored by provider to determine if a change in treatment plan is warranted.         Follow up:  3 months  [facility] Specialty Pharmacist

## 2023-04-24 NOTE — Progress Notes (Signed)
Specialty Pharmacy Refill Coordination Note  Hector [name] is a 37 y.o. adult contacted today regarding refills of specialty medication(s) Bictegravir-Emtricitab-Tenofov   Patient requested Delivery   Delivery date: 04/26/23   Verified address: [address] [name] [address]   Medication will be filled on 04/25/23.

## 2023-04-25 ENCOUNTER — Other Ambulatory Visit: Payer: Self-pay

## 2023-05-11 ENCOUNTER — Other Ambulatory Visit: Payer: Self-pay

## 2023-05-12 ENCOUNTER — Other Ambulatory Visit: Payer: Self-pay

## 2023-05-22 ENCOUNTER — Other Ambulatory Visit: Payer: Self-pay | Admitting: Internal Medicine

## 2023-05-23 ENCOUNTER — Other Ambulatory Visit: Payer: Self-pay

## 2023-05-23 MED ORDER — ESTRADIOL VALERATE 10 MG/ML IM OIL: 1.0000 mL | TOPICAL_OIL | INTRAMUSCULAR | 2 refills | Status: DC

## 2023-05-29 ENCOUNTER — Other Ambulatory Visit: Payer: Self-pay

## 2023-05-29 ENCOUNTER — Other Ambulatory Visit (HOSPITAL_COMMUNITY): Payer: Self-pay

## 2023-05-29 NOTE — Progress Notes (Signed)
 Specialty Pharmacy Refill Coordination Note  Hector [name] is a 38 y.o. adult contacted today regarding refills of specialty medication(s) Bictegravir-Emtricitab-Tenofov (Biktarvy )   Patient requested Delivery   Delivery date: 05/31/23   Verified address: [address] [place] [address]   Medication will be filled on 05/30/23.

## 2023-05-30 ENCOUNTER — Other Ambulatory Visit: Payer: Self-pay

## 2023-06-05 ENCOUNTER — Telehealth: Payer: Self-pay

## 2023-06-05 NOTE — Telephone Encounter (Signed)
 Called pt to confirm upcoming appt on 01/14 w/ Rexene Alberts. No answer and left VM.

## 2023-06-06 ENCOUNTER — Ambulatory Visit: Payer: Medicaid Other | Admitting: Infectious Diseases

## 2023-06-16 ENCOUNTER — Other Ambulatory Visit: Payer: Self-pay

## 2023-06-16 NOTE — Progress Notes (Signed)
Specialty Pharmacy Refill Coordination Note  Hector [name] is a 38 y.o. adult contacted today regarding refills of specialty medication(s) Bictegravir-Emtricitab-Tenofov [name])   Patient requested Delivery   Delivery date: 06/26/23   Verified address: [address] [name] [address]   Medication will be filled on 01.31.25.

## 2023-06-21 ENCOUNTER — Encounter: Payer: Self-pay | Admitting: Internal Medicine

## 2023-06-21 ENCOUNTER — Other Ambulatory Visit: Payer: Self-pay | Admitting: Internal Medicine

## 2023-06-21 ENCOUNTER — Ambulatory Visit: Payer: Medicaid Other | Admitting: Internal Medicine

## 2023-06-21 VITALS — BP 130/82 | HR 96 | Ht 74.0 in | Wt 318.0 lb

## 2023-06-21 DIAGNOSIS — F640 Transsexualism: Secondary | ICD-10-CM | POA: Diagnosis not present

## 2023-06-21 MED ORDER — SPIRONOLACTONE 50 MG PO TABS: 50.0000 mg | ORAL_TABLET | Freq: Two times a day (BID) | ORAL | 2 refills | Status: DC

## 2023-06-21 NOTE — Progress Notes (Signed)
Name: Hector [name]  MRN/ DOB: [id number], 17-Oct-1985    Age/ Sex: 38 y.o., adult     PCP: Patient, No Pcp Per   Reason for Endocrinology Evaluation: Gender dysphoria     Initial Endocrinology Clinic Visit: 07/11/2018    PATIENT IDENTIFIER: [name] is a 38 y.o., adult with a past medical history of HIV, schizoaffective disorder, gender dysphoria. She has followed with [facility] since 07/11/2018 for consultative assistance with management of her gender dysphoria.   HISTORICAL SUMMARY: The patient has a diagnosis of gender dysphoria.  She was started on estrogen replacement therapy in 2020.  Patient was started on dulasteride  through  her previous endocrinologist in 2021   Patient was followed by Dr. Everardo All from 2020 until 2022  Switch from oral estradiol to intramuscular estradiol valerate based on patient request 01/2023 as she was having vomiting that she attributed to estradiol intake   SUBJECTIVE:    Today (06/21/2023):  Hector [name] is here for follow-up on gender dysphoria.  No change in breast size  Continues with slowing of facial hair growth  Denies constipation or diarrhea  Has nausea in the morning , has occasional GERD. NO cannabis  Has an episode of SOB at work yesterday from running around    Estrogen Valerate 10 mg (1 mL) every 14 days- last dose last Thursday  Spironolactone 50 mg BID      HISTORY:  Past Medical History:  Past Medical History:  Diagnosis Date   Depression    Headache    History of fractured vertebra april 8th Diagnosed   Larey Seat out of a tree while climbing it.   Human immunodeficiency virus I infection (HCC) 2016   Hypertension    Immune deficiency disorder (HCC)    Mental disorder    Myocardial infarction [facility])    Past Surgical History:  Past Surgical History:  Procedure Laterality Date   NO PAST SURGERIES     Social History:  reports that she has never smoked. She has never used smokeless tobacco.  She reports that she does not drink alcohol and does not use drugs. Family History:  Family History  Problem Relation Age of Onset   CAD Mother 35       MI   Endocrine Disorder Neg Hx      HOME MEDICATIONS: Allergies as of 06/21/2023       Reactions   Tomato Anaphylaxis, Rash   Risperdal [risperidone] Nausea And Vomiting   Aspirin Itching        Medication List        Accurate as of June 21, 2023  7:15 AM. If you have any questions, ask your nurse or doctor.          Biktarvy 50-200-25 MG Tabs tablet Generic drug: bictegravir-emtricitabine-tenofovir AF Take 1 tablet by mouth daily.   Estradiol Valerate 10 MG/ML Oil Inject 1 mL (10 mg total) into the muscle every 14 (fourteen) days.   hydrocortisone 2.5 % rectal cream Commonly known as: ANUSOL-HC Place 1 Application rectally 2 (two) times daily.   meloxicam 7.5 MG tablet Commonly known as: MOBIC Take 1 tablet PO twice a day for 2 weeks and then PRN   NEEDLE (DISP) 25 G 25G X 1" Misc 1 Device by Does not apply route every 14 (fourteen) days.   spironolactone 50 MG tablet Commonly known as: ALDACTONE Take 1 tablet (50 mg total) by mouth 2 (two) times daily.   Syringe (Disposable) 1 ML Misc  1 Device by Does not apply route every 14 (fourteen) days.          OBJECTIVE:   PHYSICAL EXAM: VS: There were no vitals taken for this visit.   EXAM: General: Pt appears well and is in NAD  Eyes: External eye exam normal without stare, lid lag or exophthalmos.    Neck: General: Supple without adenopathy. Thyroid: Thyroid size normal.  No goiter or nodules appreciated.   Lungs: Clear with good BS bilat with no rales, rhonchi, or wheezes  Heart: Auscultation: RRR.  Abdomen: soft, nontender  Extremities:  BL LE: No pretibial edema normal  Mental Status: Judgment, insight: Intact Orientation: Oriented to time, place, and person Mood and affect: No depression, anxiety, or agitation     DATA REVIEWED:   Latest Reference Range & Units 06/21/23 08:59  Sodium 135 - 146 mmol/L 140  Potassium 3.5 - 5.3 mmol/L 4.1  Chloride 98 - 110 mmol/L 106  CO2 20 - 32 mmol/L 29  Glucose 65 - 139 mg/dL 578  BUN 7 - 25 mg/dL 10  Creatinine 4.69 - 6.29 mg/dL 5.28  Calcium 8.6 - 41.3 mg/dL 9.1  BUN/Creatinine Ratio 6 - 22 (calc) SEE NOTE:  WBC 3.8 - 10.8 Thousand/uL 8.5  RBC 4.20 - 5.80 Million/uL 4.65  Hemoglobin 13.2 - 17.1 g/dL 24.4  HCT 01.0 - 27.2 % 41.3  MCV 80.0 - 100.0 fL 88.8  MCH 27.0 - 33.0 pg 29.2  MCHC 32.0 - 36.0 g/dL 53.6  RDW 64.4 - 03.4 % 12.9  Platelets 140 - 400 Thousand/uL 252  MPV 7.5 - 12.5 fL 11.0     Latest Reference Range & Units 06/16/22 07:47  Testosterone 300.00 - 890.00 ng/dL 742.59      ASSESSMENT / PLAN / RECOMMENDATIONS:   Gender Dysphoria :  - M to F -Patient has not noted any clinical improvement from switching from oral estradiol to IM injections -Emphasized the importance of compliance with medications -Estradiol level has trended up but continues to be low, will increase estradiol dose as below -Testosterone pending   Medications   Take spironolactone 50 mg BID STOP  Estradiol  Valerate 10 mg/mL  ( mL) every 14 days  START Estradiol Valerate 20 mg/mL every 14 days     Follow-up in 6 months   Signed electronically by: Lyndle Herrlich, MD  [facility]in[facility]  [facility] [address] [place] [place], [place] [address] Phone: [phone] FAX: [phone]      CC: Patient, No Pcp Per No address on file Phone: None  Fax: None   Return to Endocrinology clinic as below: Future Appointments  Date Time Provider Department Center  06/21/2023 10:30 AM Nazarene Bunning, Konrad Dolores, MD [facility] None  06/26/2023 10:00 AM [name], NP [facility]

## 2023-06-23 ENCOUNTER — Telehealth: Payer: Self-pay | Admitting: Internal Medicine

## 2023-06-23 ENCOUNTER — Other Ambulatory Visit: Payer: Self-pay

## 2023-06-23 MED ORDER — ESTRADIOL VALERATE 20 MG/ML IM OIL: 20.0000 mg | TOPICAL_OIL | INTRAMUSCULAR | 1 refills | Status: DC

## 2023-06-23 NOTE — Telephone Encounter (Signed)
 Patient notified

## 2023-06-23 NOTE — Telephone Encounter (Signed)
Please let the patient know that her estrogen level is slightly better than it has been, but it continues to be low.   I have sent a new prescription of estrogen injections that are in high concentration   Stop estradiol valerate 10 mg/mL Start estradiol valerate 20 mg/mL    Take 1 mL every 14 days  Hemoglobin levels are normal   Thanks

## 2023-06-25 LAB — BASIC METABOLIC PANEL
BUN: 10 mg/dL (ref 7–25)
CO2: 29 mmol/L (ref 20–32)
Calcium: 9.1 mg/dL (ref 8.6–10.3)
Chloride: 106 mmol/L (ref 98–110)
Creat: 0.83 mg/dL (ref 0.60–1.26)
Glucose, Bld: 113 mg/dL (ref 65–139)
Potassium: 4.1 mmol/L (ref 3.5–5.3)
Sodium: 140 mmol/L (ref 135–146)

## 2023-06-25 LAB — CBC
HCT: 41.3 % (ref 38.5–50.0)
Hemoglobin: 13.6 g/dL (ref 13.2–17.1)
MCH: 29.2 pg (ref 27.0–33.0)
MCHC: 32.9 g/dL (ref 32.0–36.0)
MCV: 88.8 fL (ref 80.0–100.0)
MPV: 11 fL (ref 7.5–12.5)
Platelets: 252 10*3/uL (ref 140–400)
RBC: 4.65 10*6/uL (ref 4.20–5.80)
RDW: 12.9 % (ref 11.0–15.0)
WBC: 8.5 10*3/uL (ref 3.8–10.8)

## 2023-06-25 LAB — TESTOSTERONE, TOTAL, LC/MS/MS: Testosterone, Total, LC-MS-MS: 295 ng/dL (ref 250–1100)

## 2023-06-25 LAB — ESTRADIOL: Estradiol: 55 pg/mL — ABNORMAL HIGH (ref <=39)

## 2023-06-26 ENCOUNTER — Other Ambulatory Visit: Payer: Self-pay

## 2023-06-26 ENCOUNTER — Emergency Department (HOSPITAL_COMMUNITY): Payer: Medicaid Other

## 2023-06-26 ENCOUNTER — Emergency Department (HOSPITAL_COMMUNITY)
Admission: EM | Admit: 2023-06-26 | Discharge: 2023-06-26 | Disposition: A | Discharge status: Home / Self Care | Payer: Medicaid Other | Attending: Emergency Medicine | Admitting: Emergency Medicine

## 2023-06-26 ENCOUNTER — Ambulatory Visit: Payer: Medicaid Other | Admitting: Infectious Diseases

## 2023-06-26 ENCOUNTER — Telehealth: Payer: Self-pay | Admitting: Internal Medicine

## 2023-06-26 DIAGNOSIS — J111 Influenza due to unidentified influenza virus with other respiratory manifestations: Secondary | ICD-10-CM

## 2023-06-26 DIAGNOSIS — J101 Influenza due to other identified influenza virus with other respiratory manifestations: Secondary | ICD-10-CM | POA: Diagnosis not present

## 2023-06-26 DIAGNOSIS — Z21 Asymptomatic human immunodeficiency virus [HIV] infection status: Secondary | ICD-10-CM | POA: Diagnosis not present

## 2023-06-26 DIAGNOSIS — Z20822 Contact with and (suspected) exposure to covid-19: Secondary | ICD-10-CM | POA: Insufficient documentation

## 2023-06-26 DIAGNOSIS — I10 Essential (primary) hypertension: Secondary | ICD-10-CM | POA: Insufficient documentation

## 2023-06-26 DIAGNOSIS — R112 Nausea with vomiting, unspecified: Secondary | ICD-10-CM | POA: Diagnosis present

## 2023-06-26 LAB — RESP PANEL BY RT-PCR (RSV, FLU A&B, COVID)  RVPGX2
Influenza A by PCR: POSITIVE — AB
Influenza B by PCR: NEGATIVE
Resp Syncytial Virus by PCR: NEGATIVE
SARS Coronavirus 2 by RT PCR: NEGATIVE

## 2023-06-26 MED ORDER — SPIRONOLACTONE 100 MG PO TABS: 100.0000 mg | ORAL_TABLET | Freq: Two times a day (BID) | ORAL | 11 refills | Status: AC

## 2023-06-26 MED ORDER — PSEUDOEPHEDRINE HCL 30 MG PO TABS
30.0000 mg | ORAL_TABLET | Freq: Once | ORAL | Status: AC
  Administered 2023-06-26: 30 mg via ORAL
  Filled 2023-06-26: qty 1

## 2023-06-26 MED ORDER — PSEUDOEPHEDRINE HCL 30 MG PO TABS: 30.0000 mg | ORAL_TABLET | Freq: Three times a day (TID) | ORAL | 0 refills | Status: AC | PRN

## 2023-06-26 NOTE — Discharge Instructions (Signed)
You are positive for flu.  Alternate between Ibuprofen and Tylenol every 4 hours as needed for body aches, headaches, and fevers.  Take Sudafed 3 times a day as needed for congestion.  You can continue taking her OTC cough medication.  Follow-up with your primary care in 1 week for reevaluation.  Get help right away if: You become short of breath or have trouble breathing. Your skin or nails turn blue. You have very bad pain or stiffness in your neck. You get a sudden headache or pain in your face or ear. You vomit each time you eat or drink.

## 2023-06-26 NOTE — Telephone Encounter (Signed)
Patient notified and will make medication change

## 2023-06-26 NOTE — Telephone Encounter (Signed)
Please let the patient know that testosterone is slightly better but not as good as I thought it would be   Please asked the patient to increase prior lactone to 100 mg, 1 tablet twice daily  Please encourage the patient to use a pillbox to make sure that is consistent with spironolactone intake   thanks

## 2023-06-26 NOTE — ED Triage Notes (Signed)
Patient to ED by POV with c/o flu like symptoms x3 days. He  voices N/V, fever and chills, productive cough a d congestion.

## 2023-06-26 NOTE — ED Provider Notes (Cosign Needed)
[facility] AT [facility] Provider Note   CSN: [id number] Arrival date & time: 06/26/23  0710     History  Chief Complaint  Patient presents with   URI    Hector [name] is a 38 y.o. adult with a history of myocardial infarction, hypertension, and HIV who presents the ED today for flu-like symptoms.  Patient endorses congestion, nausea, vomiting, chills with subjective fever, productive cough for the past 3 days.  Patient has been taking Benadryl, OTC cough medication, and NyQuil with some improvement of symptoms. The nausea and vomiting have subsided over the past couple days.        Home Medications Prior to Admission medications   Medication Sig Start Date End Date Taking? Authorizing Provider  pseudoephedrine (SUDAFED) 30 MG tablet Take 1 tablet (30 mg total) by mouth every 8 (eight) hours as needed for up to 7 days for congestion. 06/26/23 07/03/23 Yes Maxwell Marion, PA-C  bictegravir-emtricitabine-tenofovir AF (BIKTARVY) 50-200-25 MG TABS tablet Take 1 tablet by mouth daily. 09/01/22 09/01/23  [name], NP  estradiol valerate (DELESTROGEN) 20 MG/ML injection Inject 1 mL (20 mg total) into the muscle every 14 (fourteen) days. 06/23/23   Shamleffer, Konrad Dolores, MD  hydrocortisone (ANUSOL-HC) 2.5 % rectal cream Place 1 Application rectally 2 (two) times daily. 11/15/22   [name], NP  meloxicam (MOBIC) 7.5 MG tablet Take 1 tablet PO twice a day for 2 weeks and then PRN 02/01/23   [provider]  NEEDLE, DISP, 25 G 25G X 1" MISC 1 Device by Does not apply route every 14 (fourteen) days. 02/15/23   Shamleffer, Konrad Dolores, MD  spironolactone (ALDACTONE) 50 MG tablet Take 1 tablet (50 mg total) by mouth 2 (two) times daily. 06/21/23   Shamleffer, Konrad Dolores, MD  Syringe, Disposable, 1 ML MISC 1 Device by Does not apply route every 14 (fourteen) days. 02/15/23   Shamleffer, Konrad Dolores, MD      Allergies    Tomato,  Risperdal [risperidone], and Aspirin    Review of Systems   Review of Systems  HENT:  Positive for congestion.   All other systems reviewed and are negative.   Physical Exam Updated Vital Signs BP (!) 156/92 (BP Location: Right Arm)   Pulse 75   Temp 98.6 F (37 C) (Oral)   Resp 18   Ht 6\' 2"  (1.88 m)   Wt 135.2 kg   SpO2 97%   BMI 38.26 kg/m  Physical Exam Vitals and nursing note reviewed.  Constitutional:      General: She is not in acute distress.    Appearance: Normal appearance.  HENT:     Head: Normocephalic and atraumatic.     Mouth/Throat:     Mouth: Mucous membranes are moist.  Eyes:     Conjunctiva/sclera: Conjunctivae normal.     Pupils: Pupils are equal, round, and reactive to light.  Cardiovascular:     Rate and Rhythm: Normal rate and regular rhythm.     Pulses: Normal pulses.     Heart sounds: Normal heart sounds.  Pulmonary:     Effort: Pulmonary effort is normal.     Breath sounds: Normal breath sounds.  Abdominal:     Palpations: Abdomen is soft.     Tenderness: There is no abdominal tenderness.  Musculoskeletal:        General: Normal range of motion.     Cervical back: Normal range of motion.  Skin:  General: Skin is warm and dry.     Findings: No rash.  Neurological:     General: No focal deficit present.     Mental Status: She is alert.  Psychiatric:        Mood and Affect: Mood normal.        Behavior: Behavior normal.    ED Results / Procedures / Treatments   Labs (all labs ordered are listed, but only abnormal results are displayed) Labs Reviewed  RESP PANEL BY RT-PCR (RSV, FLU A&B, COVID)  RVPGX2 - Abnormal; Notable for the following components:      Result Value   Influenza A by PCR POSITIVE (*)    All other components within normal limits    EKG None  Radiology DG Chest 2 View Result Date: 06/26/2023 CLINICAL DATA:  Shortness of breath. EXAM: CHEST - 2 VIEW COMPARISON:  06/07/2018. FINDINGS: Bilateral lung fields are  clear. Bilateral costophrenic angles are clear. Normal cardio-mediastinal silhouette. No acute osseous abnormalities. The soft tissues are within normal limits. IMPRESSION: *No active cardiopulmonary disease. Electronically Signed   By: Jules Schick M.D.   On: 06/26/2023 08:17    Procedures Procedures: not indicated.   Medications Ordered in ED Medications  pseudoephedrine (SUDAFED) tablet 30 mg (30 mg Oral Given 06/26/23 1610)    ED Course/ Medical Decision Making/ A&P                                 Medical Decision Making Amount and/or Complexity of Data Reviewed Radiology: ordered.  Risk OTC drugs.   This patient presents to the ED for concern of congestion, this involves an extensive number of treatment options, and is a complaint that carries with it a high risk of complications and morbidity.   Differential diagnosis includes: flu, COVID, RSV, other viral illness, sinusitis, etc.   Comorbidities  See HPI above   Additional History  Additional history obtained from prior records   Lab Tests  I ordered and personally interpreted labs.  The pertinent results include:   Respiratory panel positive for flu A   Imaging Studies  I ordered imaging studies including CXR  I independently visualized and interpreted imaging which showed: No active cardiopulmonary disease I agree with the radiologist interpretation   Problem List / ED Course / Critical Interventions / Medication Management  Congestion, cough, nausea and vomiting for the past 3 days. Symptoms have been improving since the onset although the congestion is still bothersome to patient. Has taken Benadryl without improvement. I ordered medications including: Sudafed for congestion  Reevaluation of the patient after these medicines showed that the patient improved I have reviewed the patients home medicines and have made adjustments as needed   Social Determinants of Health  Access to  healthcare   Test / Admission - Considered  Patient is stable and safe for discharge home. Return precautions given.        Final Clinical Impression(s) / ED Diagnoses Final diagnoses:  Flu    Rx / DC Orders ED Discharge Orders          Ordered    pseudoephedrine (SUDAFED) 30 MG tablet  Every 8 hours PRN        06/26/23 0847              Maxwell Marion, PA-C 06/26/23 [phone]

## 2023-06-26 NOTE — Addendum Note (Signed)
Addended by: Scarlette Shorts on: 06/26/2023 11:00 AM   Modules accepted: Orders

## 2023-07-12 ENCOUNTER — Other Ambulatory Visit: Payer: Self-pay

## 2023-07-12 NOTE — Progress Notes (Signed)
 Specialty Pharmacy Ongoing Clinical Assessment Note  Hector [name] is a 38 y.o. adult who is being followed by the specialty pharmacy service for RxSp HIV   Patient's specialty medication(s) reviewed today: Bictegravir-Emtricitab-Tenofov (Biktarvy)   Missed doses in the last 4 weeks: 0   Patient/Caregiver did not have any additional questions or concerns.   Therapeutic benefit summary: Patient is achieving benefit   Adverse events/side effects summary: No adverse events/side effects   Patient's therapy is appropriate to: Continue    Goals Addressed             This Visit's Progress    Achieve Undetectable HIV Viral Load < 20   On track    Patient is on track. Patient will maintain adherence.         Follow up:  3 months  [facility] Specialty Pharmacist

## 2023-07-25 ENCOUNTER — Ambulatory Visit: Payer: Medicaid Other | Admitting: Infectious Diseases

## 2023-08-02 ENCOUNTER — Other Ambulatory Visit (HOSPITAL_COMMUNITY): Payer: Self-pay

## 2023-08-04 ENCOUNTER — Other Ambulatory Visit (HOSPITAL_COMMUNITY): Payer: Self-pay

## 2023-08-04 ENCOUNTER — Other Ambulatory Visit: Payer: Self-pay

## 2023-08-07 ENCOUNTER — Other Ambulatory Visit: Payer: Self-pay

## 2023-08-07 NOTE — Progress Notes (Signed)
 Specialty Pharmacy Refill Coordination Note  Hector [name] is a 38 y.o. adult contacted today regarding refills of specialty medication(s) Bictegravir-Emtricitab-Tenofov [name])   Patient requested Delivery   Delivery date: 08/09/23   Verified address: [address] [name] [address]   Medication will be filled on 03.18.25.

## 2023-08-21 ENCOUNTER — Other Ambulatory Visit: Payer: Self-pay

## 2023-08-21 ENCOUNTER — Ambulatory Visit: Admitting: Infectious Diseases

## 2023-08-21 DIAGNOSIS — Z21 Asymptomatic human immunodeficiency virus [HIV] infection status: Secondary | ICD-10-CM

## 2023-08-21 MED ORDER — BIKTARVY 50-200-25 MG PO TABS
1.0000 | ORAL_TABLET | Freq: Every day | ORAL | 4 refills | Status: DC
  Filled 2023-08-21 – 2023-09-04 (×2): qty 30, 30d supply, fill #0

## 2023-08-31 ENCOUNTER — Other Ambulatory Visit: Payer: Self-pay | Admitting: Internal Medicine

## 2023-09-04 ENCOUNTER — Other Ambulatory Visit: Payer: Self-pay

## 2023-09-04 NOTE — Progress Notes (Signed)
 Specialty Pharmacy Refill Coordination Note  Orlondo Holycross is a 38 y.o. adult contacted today regarding refills of specialty medication(s) Bictegravir-Emtricitab-Tenofov Maryruth Sol)   Patient requested Delivery   Delivery date: 09/06/23   Verified address: [address] [place] [address]   Medication will be filled on 09/05/23.

## 2023-09-22 ENCOUNTER — Ambulatory Visit: Admitting: Infectious Diseases

## 2023-09-22 ENCOUNTER — Other Ambulatory Visit: Payer: Self-pay

## 2023-09-22 ENCOUNTER — Encounter: Payer: Self-pay | Admitting: Infectious Diseases

## 2023-09-22 VITALS — BP 153/89 | HR 69 | Temp 97.9°F | Ht 74.0 in | Wt 311.0 lb

## 2023-09-22 DIAGNOSIS — Z21 Asymptomatic human immunodeficiency virus [HIV] infection status: Secondary | ICD-10-CM

## 2023-09-22 DIAGNOSIS — R4589 Other symptoms and signs involving emotional state: Secondary | ICD-10-CM

## 2023-09-22 DIAGNOSIS — K648 Other hemorrhoids: Secondary | ICD-10-CM

## 2023-09-22 DIAGNOSIS — K5900 Constipation, unspecified: Secondary | ICD-10-CM

## 2023-09-22 DIAGNOSIS — R630 Anorexia: Secondary | ICD-10-CM

## 2023-09-22 DIAGNOSIS — R030 Elevated blood-pressure reading, without diagnosis of hypertension: Secondary | ICD-10-CM

## 2023-09-22 DIAGNOSIS — K649 Unspecified hemorrhoids: Secondary | ICD-10-CM

## 2023-09-22 DIAGNOSIS — Z1212 Encounter for screening for malignant neoplasm of rectum: Secondary | ICD-10-CM

## 2023-09-22 DIAGNOSIS — G479 Sleep disorder, unspecified: Secondary | ICD-10-CM | POA: Diagnosis not present

## 2023-09-22 DIAGNOSIS — Z113 Encounter for screening for infections with a predominantly sexual mode of transmission: Secondary | ICD-10-CM

## 2023-09-22 MED ORDER — LOSARTAN POTASSIUM 50 MG PO TABS: 50.0000 mg | ORAL_TABLET | Freq: Every day | ORAL | 1 refills | Status: AC

## 2023-09-22 MED ORDER — MIRTAZAPINE 15 MG PO TABS: 15.0000 mg | ORAL_TABLET | Freq: Every day | ORAL | 1 refills | Status: AC

## 2023-09-22 MED ORDER — HYDROCORTISONE ACETATE 25 MG RE SUPP: 25.0000 mg | Freq: Two times a day (BID) | RECTAL | 0 refills | Status: AC

## 2023-09-22 MED ORDER — BIKTARVY 50-200-25 MG PO TABS: 1.0000 | ORAL_TABLET | Freq: Every day | ORAL | 5 refills | Status: DC

## 2023-09-22 NOTE — Patient Instructions (Addendum)
 Ask your hormone doctor about the potential of doing a smaller dose weekly to keep levels more consistent.   Blood pressure medication one pill once a day - LOSARTAN 50 mg tablet.   Take this in the morning daily and come back to see me in 1 month   REMERON 15 mg tablets once after dinner in evening - may help with appetite and sleep - though the appetite may get better if you fix your constipation.   We will need to repeat your blood work in 2 weeks (please schedule before you leave)   Will have you back to see me in 4 weeks to repeat your blood pressure and see how that is going.   For help with your blood pressure management and other primary care things - I would recommend you see if [facility] is taking new patients - they are in the same building here  Address: [address] [place], [name], [place] [address] Phone: [phone]

## 2023-09-22 NOTE — Progress Notes (Signed)
 Name: Hector [name]  DOB: 07-24-1985 MRN: [id number] PCP: Patient, No Pcp Per     Brief Narrative:  Hector [name] is a 38 y.o. adult with HIV 2016.  In care initially at [facility] clinic.  A few med lapses over the years but has been consistently well controlled on Biktarvy  since January 2020. CD4 nadir unknown (>200 per report). HIV Risk: sexual  History of OIs: none Intake Labs: 2018 Hep B sAg (-), sAb (+), cAb (-); Hep A (-), Hep C (-) Quantiferon (-) HLA B*5701 (-) G6PD: ()   Previous Regimens: Prezcobix + Truvada 2016 Triumeq  2018 Biktarvy  2020  Genotypes:   Subjective:   Chief Complaint  Patient presents with   Follow-up    Discussed the use of AI scribe software for clinical note transcription with the patient, who gave verbal consent to proceed.  History of Present Illness   Hector [name] is a 38 year old transfemale who presents with rectal bleeding and abdominal cramps during routine HIV follow up care visit.   She has been experiencing rectal bleeding for the past four days, with dark red blood occurring without accompanying stool. No clots. No dizziness or chest pain. Abdominal cramps occur before bowel movements and subside afterward if she can have one. She has not had a bowel movement in the past four days, only passing blood, up until today where she achieved a large BM.   She has a history of rectal chlamydia, diagnosed in June 2024, for which she completed a course of doxycycline . Currently, no symptoms of mucus discharge or pain during bowel movements. She has not had any sexual intercourse in the last 6 months.   She is looking for top surgery soon - needs a letter with recent labs and aproval to proceed. She also needs her blood pressure better controlled. She is on spironolactone  200 mg twice daily, taken consistently. She reports mood swings and changes in appetite, attributing these to her hormone therapy. She describes feeling 'up and down,' with  episodes of irritability and loss of interest in activities.   There is a family history of high blood pressure, with her aunt and grandmother affected. She mentions her blood pressure has been elevated, and she does not have a regular primary care doctor.  She is preparing for breast augmentation surgery and expresses concern about her current hormone therapy regimen, noting significant mood fluctuations and sleep disturbances. She reports sleeping excessively at times and feeling depressed, with a decreased appetite.           11/15/2022    8:48 AM  Depression screen PHQ 2/9  Decreased Interest 0  Down, Depressed, Hopeless 0  PHQ - 2 Score 0    Review of Systems  Constitutional:  Negative for chills, fever and weight loss.  Respiratory: Negative.    Cardiovascular: Negative.   Gastrointestinal:  Positive for abdominal pain, blood in stool and constipation. Negative for diarrhea.  Genitourinary: Negative.   Neurological:  Negative for dizziness and headaches.    Past Medical History:  Diagnosis Date   Depression    Headache    History of fractured vertebra april 8th Diagnosed   Hector [name] out of a tree while climbing it.   Human immunodeficiency virus I infection (HCC) 2016   Hypertension    Immune deficiency disorder (HCC)    Mental disorder    Myocardial infarction [facility])     Outpatient Medications Prior to Visit  Medication Sig Dispense Refill   bictegravir-emtricitabine -tenofovir  AF (BIKTARVY )  50-200-25 MG TABS tablet Take 1 tablet by mouth daily. 30 tablet 4   estradiol  valerate (DELESTROGEN ) 20 MG/ML injection INJECT 1 ML (20 MG TOTAL) INTO THE MUSCLE EVERY 14 (FOURTEEN) DAYS. 15 mL 1   NEEDLE, DISP, 25 G 25G X 1" MISC 1 Device by Does not apply route every 14 (fourteen) days. 10 each 2   spironolactone  (ALDACTONE ) 100 MG tablet Take 1 tablet (100 mg total) by mouth 2 (two) times daily. 60 tablet 11   Syringe, Disposable, 1 ML MISC 1 Device by Does not apply route every  14 (fourteen) days. 10 each 2   hydrocortisone  (ANUSOL -HC) 2.5 % rectal cream Place 1 Application rectally 2 (two) times daily. (Patient not taking: Reported on 09/22/2023) 30 g 0   meloxicam (MOBIC) 7.5 MG tablet Take 1 tablet PO twice a day for 2 weeks and then PRN (Patient not taking: Reported on 09/22/2023)     No facility-administered medications prior to visit.     Allergies  Allergen Reactions   Tomato Anaphylaxis and Rash   Risperdal [Risperidone] Nausea And Vomiting   Aspirin Itching    Social History   Tobacco Use   Smoking status: Never   Smokeless tobacco: Never  Vaping Use   Vaping status: Never Used  Substance Use Topics   Alcohol use: No   Drug use: No    Family History  Problem Relation Age of Onset   CAD Mother 54       MI   Endocrine Disorder Neg Hx     Social History   Substance and Sexual Activity  Sexual Activity Never   Comment: DECLINED CONDOMS     Objective:   Vitals:   09/22/23 0828  BP: (!) 153/89  Pulse: 69  Temp: 97.9 F (36.6 C)  TempSrc: Temporal  SpO2: 99%  Weight: (!) 311 lb (141.1 kg)  Height: 6\' 2"  (1.88 m)   Body mass index is 39.93 kg/m.   Physical Exam Vitals reviewed.  Cardiovascular:     Rate and Rhythm: Normal rate.  Pulmonary:     Effort: Pulmonary effort is normal.     Comments: No shortness of breath detected in conversation.  Skin:    General: Skin is warm and dry.     Findings: No rash.  Neurological:     Mental Status: She is oriented to person, place, and time.  Psychiatric:        Mood and Affect: Mood normal.        Behavior: Behavior normal.        Thought Content: Thought content normal.        Judgment: Judgment normal.     Lab Results Lab Results  Component Value Date   WBC 8.5 06/21/2023   HGB 13.6 06/21/2023   HCT 41.3 06/21/2023   MCV 88.8 06/21/2023   PLT 252 06/21/2023    Lab Results  Component Value Date   CREATININE 0.83 06/21/2023   BUN 10 06/21/2023   NA 140 06/21/2023    K 4.1 06/21/2023   CL 106 06/21/2023   CO2 29 06/21/2023    Lab Results  Component Value Date   ALT 20 08/21/2022   AST 17 08/21/2022   ALKPHOS 66 08/21/2022   BILITOT 0.4 08/21/2022    Lab Results  Component Value Date   CHOL 143 02/25/2021   HDL 32 (L) 02/25/2021   LDLCALC 87 02/25/2021   TRIG 144 02/25/2021   CHOLHDL 4.5 02/25/2021   HIV 1  RNA Quant (Copies/mL)  Date Value  09/01/2022 Not Detected  02/25/2021 348 (H)  04/24/2020 <20   CD4 T Cell Abs (/uL)  Date Value  09/01/2022 1,053  02/25/2021 839  08/30/2018 650     Assessment & Plan:   HIV Infection -  VL < 20 , CD4 > 500 -  Very well controlled on once daily Biktarvy . No concerns with access or adherence to medication. They are tolerating the medication well without side effects. No drug interactions identified. Pertinent lab tests ordered today. No changes to insurance coverage.  No dental needs today.  Some concern over depressed mood - recommended remeron to start.  Sexual health and family planning discussed - site specific screening collected today.      Hypertension - Hypertension with a family history. Current blood pressure requires management > 150/90. Spironolactone  is being taken 200 mg daily. Considered amlodipine however I don't want to further constipation problems. Will see if she responds to lower dose Losartan and have her back in 1-2 weeks to check kidney and potassium levels. Could consider metoprolol  Referral to primary care discussed.  - Start losartan 50 mg once daily for blood pressure management. - Monitor blood pressure and perform a chemistry panel in 1-2 weeks.  - Provide resources for primary care follow-up.  Constipation with internal hemorrhoids - Four-day history of bright red rectal bleeding with bowel movements, likely due to constipation and internal hemorrhoid based on expanded history we discussed today. No evidence of external hemorrhoids or anal masses. Recent bowel  movement was normal, indicating possible resolution of acute symptoms. Plan to address constipation and hemorrhoids with medication and lifestyle changes. Discussed the use of Miralax and Exlax for bowel movement regulation. Could consider low dose linzess if despite lifestyle changes and OTC medications this persists.  - Prescribe Ansol suppositories for hemorrhoid inflammation. - Recommend Miralax daily to prevent constipation. - Suggest Exlax for more aggressive bowel movement stimulation - 2 chews today and tomorrow.  - Perform rectal swab to test for chlamydia, gonorrhea, and HPV/cytology.  - Further recommendations pending results of today's tests   Mood Swings -  ?Depressed Mood -  Symptoms of mood swings, decreased appetite, and sleep disturbances, she attributes to hormone therapy. Discussed potential benefits of mirtazapine for mood support, appetite stimulation, and sleep improvement. She expresses interest in feeling better and acknowledges mood changes may be hormone-related. Consideration of adjusting hormone therapy to smaller weekly doses to reduce mood swings - she will FU with her endocrinologist to see if 10 mg weekly injections may be more helpful to stabilize.  - Start mirtazapine 15 mg at night for mood, appetite, and sleep. - Discuss with endocrinologist the possibility of adjusting hormone therapy to smaller weekly doses to reduce mood swings.       Gibson Kurtz, MSN, NP-C [facility] Pager: [phone] Office: [phone]  09/22/23  8:46 AM

## 2023-09-23 LAB — CT/NG RNA, TMA RECTAL
Chlamydia Trachomatis RNA: NOT DETECTED
Neisseria Gonorrhoeae RNA: NOT DETECTED

## 2023-09-25 LAB — CYTOLOGY - NON PAP

## 2023-09-26 LAB — T PALLIDUM AB: T Pallidum Abs: POSITIVE — AB

## 2023-09-26 LAB — T-HELPER CELLS (CD4) COUNT (NOT AT ARMC)
Absolute CD4: 924 {cells}/uL (ref 490–1740)
CD4 T Helper %: 36 % (ref 30–61)
Total lymphocyte count: 2555 {cells}/uL (ref 850–3900)

## 2023-09-26 LAB — RPR TITER: RPR Titer: 1:2 {titer} — ABNORMAL HIGH

## 2023-09-26 LAB — RPR: RPR Ser Ql: REACTIVE — AB

## 2023-09-26 LAB — HIV-1 RNA QUANT-NO REFLEX-BLD
HIV 1 RNA Quant: NOT DETECTED {copies}/mL
HIV-1 RNA Quant, Log: NOT DETECTED {Log_copies}/mL

## 2023-09-27 ENCOUNTER — Other Ambulatory Visit: Payer: Self-pay

## 2023-09-28 ENCOUNTER — Other Ambulatory Visit (HOSPITAL_COMMUNITY): Payer: Self-pay

## 2023-09-28 ENCOUNTER — Other Ambulatory Visit: Payer: Self-pay

## 2023-09-28 ENCOUNTER — Other Ambulatory Visit: Payer: Self-pay | Admitting: Pharmacist

## 2023-09-28 DIAGNOSIS — Z21 Asymptomatic human immunodeficiency virus [HIV] infection status: Secondary | ICD-10-CM

## 2023-09-28 MED ORDER — BIKTARVY 50-200-25 MG PO TABS
1.0000 | ORAL_TABLET | Freq: Every day | ORAL | 5 refills | Status: DC
  Filled 2023-09-28: qty 30, 30d supply, fill #0
  Filled 2023-11-13: qty 30, 30d supply, fill #1
  Filled 2023-12-06: qty 30, 30d supply, fill #2
  Filled 2023-12-29: qty 30, 30d supply, fill #3
  Filled 2024-01-23: qty 30, 30d supply, fill #4
  Filled 2024-03-15 – 2024-03-25 (×2): qty 30, 30d supply, fill #5

## 2023-09-28 NOTE — Progress Notes (Signed)
 Specialty Pharmacy Ongoing Clinical Assessment Note  Hector [name] is a 38 y.o. adult who is being followed by the specialty pharmacy service for RxSp HIV   Patient's specialty medication(s) reviewed today: Bictegravir-Emtricitab-Tenofov (Biktarvy )   Missed doses in the last 4 weeks: 0   Patient/Caregiver did not have any additional questions or concerns.   Therapeutic benefit summary: Patient is achieving benefit   Adverse events/side effects summary: No adverse events/side effects   Patient's therapy is appropriate to: Continue    Goals Addressed             This Visit's Progress    Achieve Undetectable HIV Viral Load < 20   On track    Patient is on track. Patient will maintain adherence. As of 09/21/23, labs show that viral load is undetectable.         Follow up: 3 months  [facility]

## 2023-09-28 NOTE — Progress Notes (Signed)
 Specialty Pharmacy Refill Coordination Note  Hector [name] is a 38 y.o. adult contacted today regarding refills of specialty medication(s) Bictegravir-Emtricitab-Tenofov (Biktarvy )   Patient requested Delivery   Delivery date: 10/02/23   Verified address: [address] [place] [address]   Medication will be filled on 09/29/23.

## 2023-09-29 ENCOUNTER — Other Ambulatory Visit: Payer: Self-pay

## 2023-10-04 ENCOUNTER — Other Ambulatory Visit: Payer: Self-pay

## 2023-10-04 DIAGNOSIS — Z21 Asymptomatic human immunodeficiency virus [HIV] infection status: Secondary | ICD-10-CM

## 2023-10-06 ENCOUNTER — Ambulatory Visit: Payer: Self-pay | Admitting: Infectious Diseases

## 2023-10-09 ENCOUNTER — Other Ambulatory Visit

## 2023-10-18 ENCOUNTER — Other Ambulatory Visit: Payer: Self-pay

## 2023-10-23 ENCOUNTER — Ambulatory Visit: Payer: Self-pay | Admitting: Infectious Diseases

## 2023-10-23 ENCOUNTER — Other Ambulatory Visit: Payer: Self-pay

## 2023-10-25 ENCOUNTER — Other Ambulatory Visit: Payer: Self-pay

## 2023-10-30 ENCOUNTER — Other Ambulatory Visit (HOSPITAL_COMMUNITY): Payer: Self-pay

## 2023-11-13 ENCOUNTER — Other Ambulatory Visit: Payer: Self-pay

## 2023-11-13 NOTE — Progress Notes (Signed)
 Specialty Pharmacy Refill Coordination Note  Hector [name] is a 38 y.o. adult contacted today regarding refills of specialty medication(s) Bictegravir-Emtricitab-Tenofov (Biktarvy )   Patient requested Delivery   Delivery date: 11/14/23   Verified address: [address] [name] [address]   Medication will be filled on 11/13/23.

## 2023-11-13 NOTE — Progress Notes (Signed)
 Specialty Pharmacy Ongoing Clinical Assessment Note  Ted Goodner is a 38 y.o. adult who is being followed by the specialty pharmacy service for RxSp HIV   Patient's specialty medication(s) reviewed today: Bictegravir-Emtricitab-Tenofov (Biktarvy )   Missed doses in the last 4 weeks: 0   Patient/Caregiver did not have any additional questions or concerns.   Therapeutic benefit summary: Patient is achieving benefit   Adverse events/side effects summary: No adverse events/side effects   Patient's therapy is appropriate to: Continue    Goals Addressed             This Visit's Progress    Achieve Undetectable HIV Viral Load < 20   On track    Patient is on track. Patient will maintain adherence. As of 09/22/23, labs show that viral load is undetectable.         Follow up: 6 months  Silvano LOISE Dolly Specialty Pharmacist

## 2023-12-01 ENCOUNTER — Other Ambulatory Visit: Payer: Self-pay

## 2023-12-06 ENCOUNTER — Other Ambulatory Visit: Payer: Self-pay

## 2023-12-06 NOTE — Progress Notes (Signed)
 Specialty Pharmacy Refill Coordination Note  Hector [name] is a 38 y.o. adult contacted today regarding refills of specialty medication(s) Bictegravir-Emtricitab-Tenofov (Biktarvy )   Patient requested Delivery   Delivery date: 12/08/23   Verified address: [address] [place] [address]   Medication will be filled on 12/07/23.

## 2023-12-08 ENCOUNTER — Other Ambulatory Visit: Payer: Self-pay

## 2023-12-08 ENCOUNTER — Telehealth: Payer: Self-pay | Admitting: Internal Medicine

## 2023-12-08 MED ORDER — ESTRADIOL VALERATE 20 MG/ML IM OIL: 20.0000 mg | TOPICAL_OIL | INTRAMUSCULAR | 1 refills | Status: AC

## 2023-12-08 NOTE — Telephone Encounter (Signed)
 Rx sent.

## 2023-12-08 NOTE — Telephone Encounter (Signed)
 MEDICATION:  1)  estradiol  valerate (DELESTROGEN ) 20 MG/ML injection   2)  NEEDLE, DISP, 25 G 25G X 1 MISC   PHARMACY:  [facility] Address: [address] [place], [place], [place] [address] Phone: [phone]  HAS THE PATIENT CONTACTED THEIR PHARMACY?  Not this pharmacy  IS THIS A 90 DAY SUPPLY : Yes  IS PATIENT OUT OF MEDICATION: Yes  IF NOT; HOW MUCH IS LEFT:   LAST APPOINTMENT DATE: @4 /02/2024  NEXT APPOINTMENT DATE:@Visit  date not found  DO WE HAVE YOUR PERMISSION TO LEAVE A DETAILED MESSAGE?:Yes  OTHER COMMENTS: [facility]   **Let patient know to contact pharmacy at the end of the day to make sure medication is ready. **  ** Please notify patient to allow 48-72 hours to process**  **Encourage patient to contact the pharmacy for refills or they can request refills through [facility]**

## 2023-12-13 ENCOUNTER — Ambulatory Visit (INDEPENDENT_AMBULATORY_CARE_PROVIDER_SITE_OTHER): Admitting: Clinical

## 2023-12-13 ENCOUNTER — Encounter (HOSPITAL_COMMUNITY): Payer: Self-pay

## 2023-12-13 DIAGNOSIS — F331 Major depressive disorder, recurrent, moderate: Secondary | ICD-10-CM | POA: Diagnosis not present

## 2023-12-13 NOTE — Progress Notes (Signed)
 Comprehensive Clinical Assessment (CCA) Note  12/13/2023 Hector [name] [id number]  Chief Complaint:  Chief Complaint  Patient presents with   Depression   Visit Diagnosis:   MDD, recurrent episode, moderate with anxious distress  Interpretive Summary:    Client is a 38 year old transgender male presenting to the [facility] center as a walk in. Client reported reported she has lost interest, been in the bed for 2 weeks straight, I don't like being around people, lost appetite. Client reported, not normal for me. Client reported it has been going on for 3 months now. Client reported her roommate states she oversleeps. Client reported she does not come out her room for nothing. Client reported one day a shift just happened. Per the clients chart review their is prior documentation of a schizoaffective disorder diagnosis in 2016 via [facility]. Client reported they do not remember this. Client reported years ago receiving medication via [facility] but cannot recall medication or prior diagnosis history. Client reported no prior history of therapy. Client reported no history of alcohol or illicit substance use. Client denied family history of mental health disorder. Client presented oriented times five, appropriately dressed and friendly. Client denied hallucinations, delusions, suicidal and homicidal ideations. Client was screened for pain, nutrition , [place] suicide severity and the following SDOH:    12/13/2023    8:18 AM 09/22/2023    8:58 AM  GAD 7 : Generalized Anxiety Score  Nervous, Anxious, on Edge 2 0  Control/stop worrying 2 0  Worry too much - different things 2 1  Trouble relaxing 2 2  Restless 0 0  Easily annoyed or irritable 3 3  Afraid - awful might happen 0 0  Total GAD 7 Score 11 6  Anxiety Difficulty Very difficult Somewhat difficult     Flowsheet Row Counselor from 12/13/2023 in [facility]   PHQ-9 Total Score 16      Treatment recommendations:  client reported only wanting medication management and psychiatry. Client denied wanting individual therapy  Therapist provided information on format of appointment (virtual or face to face).   The client was advised to call back or seek an in-person evaluation if the symptoms worsen or if the condition fails to improve as anticipated before the next scheduled appointment.  Client was in agreement with treatment recommendations.  CCA Biopsychosocial Intake/Chief Complaint:  client is presenting due to symptoms of depression.  Current Symptoms/Problems: client reported lack of intrest, insomnia, isolation , loss of appetite, irritability  Patient Reported Schizophrenia/Schizoaffective Diagnosis in Past: Yes  Strengths: voluntarily engaging in services  Preferences: psychiatry  Abilities: discuss current symptoms  Type of Services Patient Feels are Needed: medication management with psychistrist  Initial Clinical Notes/Concerns: No data recorded  Mental Health Symptoms Depression:  Change in energy/activity; Sleep (too much or little); Increase/decrease in appetite; Irritability   Duration of Depressive symptoms: Greater than two weeks   Mania:  None   Anxiety:   Tension; Irritability   Psychosis:  None   Duration of Psychotic symptoms: No data recorded  Trauma:  None   Obsessions:  None   Compulsions:  None   Inattention:  None   Hyperactivity/Impulsivity:  None   Oppositional/Defiant Behaviors:  None   Emotional Irregularity:  None   Other Mood/Personality Symptoms:  No data recorded   Mental Status Exam Appearance and self-care  Stature:  Tall   Weight:  Average weight   Clothing:  Casual   Grooming:  Normal   Cosmetic use:  Age appropriate   Posture/gait:  Normal   Motor activity:  Not Remarkable   Sensorium  Attention:  Normal   Concentration:  Normal   Orientation:  X5    Recall/memory:  Normal   Affect and Mood  Affect:  Congruent   Mood:  Euthymic   Relating  Eye contact:  Normal   Facial expression:  Responsive   Attitude toward examiner:  Cooperative   Thought and Language  Speech flow: Clear and Coherent   Thought content:  Appropriate to Mood and Circumstances   Preoccupation:  None   Hallucinations:  None   Organization:  No data recorded  [employer] of Knowledge:  Good   Intelligence:  Average   Abstraction:  Normal   Judgement:  Good   Reality Testing:  Adequate   Insight:  Good   Decision Making:  Normal   Social Functioning  Social Maturity:  Responsible   Social Judgement:  Normal   Stress  Stressors:  Transitions   Coping Ability:  Set designer Deficits:  Activities of daily living   Supports:  Family     Religion: Religion/Spirituality Are You A Religious Person?: No  Leisure/Recreation: Leisure / Recreation Do You Have Hobbies?: No  Exercise/Diet: Exercise/Diet Do You Exercise?: No Have You Gained or Lost A Significant Amount of Weight in the Past Six Months?: No Do You Follow a Special Diet?: No Do You Have Any Trouble Sleeping?: Yes Explanation of Sleeping Difficulties: cant sleep at night but sleeps during the day   CCA Employment/Education Employment/Work Situation: Employment / Work Situation Employment Situation: Employed 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 client reported being raised by mother and grandmother. client reported childhood was up and down. Patient's description of current relationship with people who raised him/her: client reported his mother and grandmother are deceased. Does patient have siblings?: No Did patient suffer any verbal/emotional/physical/sexual abuse as a child?: No Did patient suffer from severe childhood neglect?: No Has patient ever been sexually abused/assaulted/raped as an adolescent or adult?: No Was the patient ever a victim of a crime or a disaster?: No Witnessed domestic violence?: No Has patient been affected by domestic violence as an adult?: No  Child/Adolescent Assessment:     CCA Substance Use Alcohol/Drug Use: Alcohol / Drug Use History of alcohol / drug use?: No history of alcohol / drug abuse                         ASAM's:  Six Dimensions of Multidimensional Assessment  Dimension 1:  Acute Intoxication and/or Withdrawal Potential:      Dimension 2:  Biomedical Conditions and Complications:      Dimension 3:  Emotional, Behavioral, or Cognitive Conditions and Complications:     Dimension 4:  Readiness to Change:     Dimension 5:  Relapse, Continued use, or Continued Problem Potential:     Dimension 6:  Recovery/Living Environment:  ASAM Severity Score:    ASAM Recommended Level of Treatment:     Substance use Disorder (SUD)    Recommendations for Services/Supports/Treatments: Recommendations for Services/Supports/Treatments Recommendations For Services/Supports/Treatments: Medication Management, Individual Therapy  DSM5 Diagnoses: Patient Active Problem List   Diagnosis Date Noted   External hemorrhoid, thrombosed 11/15/2022   Routine screening for STI (sexually transmitted infection) 11/15/2022   Elevated blood pressure reading 03/08/2022   Transgender 06/14/2018   Hx of neurosyphilis 09/16/2016   Human  immunodeficiency virus I infection (HCC) 07/20/2016   Hepatitis B immune 07/18/2016   Insomnia related to another mental disorder 10/04/2011   Schizoaffective disorder, bipolar type (HCC) 10/03/2011    Class: Acute    Patient Centered Plan: Patient is on the following Treatment Plan(s):  Depression   Referrals to Alternative Service(s): Referred to Alternative Service(s):   Place:   Date:   Time:    Referred to Alternative Service(s):   Place:   Date:   Time:    Referred to Alternative Service(s):   Place:   Date:   Time:    Referred to Alternative Service(s):   Place:   Date:   Time:      Collaboration of Care: Medication Management [facility] walk in clinic  Patient/Guardian was advised Release of Information must be obtained prior to any record release in order to collaborate their care with an outside provider. Patient/Guardian was advised if they have not already done so to contact the registration department to sign all necessary forms in order for us  to release information regarding their care.   Consent: Patient/Guardian gives verbal consent for treatment and assignment of benefits for services provided during this visit. Patient/Guardian expressed understanding and agreed to proceed.   Selso Mannor Y Kaston Faughn, LCSW

## 2023-12-29 ENCOUNTER — Other Ambulatory Visit: Payer: Self-pay

## 2023-12-29 NOTE — Progress Notes (Signed)
 Specialty Pharmacy Refill Coordination Note  Hector [name] is a 38 y.o. adult contacted today regarding refills of specialty medication(s) Bictegravir-Emtricitab-Tenofov (Biktarvy )   Patient requested Delivery   Delivery date: 01/01/24   Verified address: [address] [place] [address]   Medication will be filled on 12/29/23.

## 2023-12-29 NOTE — Progress Notes (Signed)
 Spoke with patient about symptoms. Trialed a new soap this morning while washing feet. Discussed her symptoms are not related to Biktarvy  given she has been stable on this for years. Denies any recent sexual activity either.  She states she needs to follow with [facility] for her HIV and also diabetes. Missed one month follow-up with [facility] in June; rescheduled for 01/15/24.

## 2023-12-29 NOTE — Progress Notes (Signed)
 Clinical Intervention Note  Clinical Intervention Notes: Patient reported new rash on feet - plans to contact [facility].   Clinical Intervention Outcomes: Prevention of an adverse drug event   Powell CHRISTELLA Gallus Specialty Pharmacist

## 2023-12-30 ENCOUNTER — Encounter (HOSPITAL_COMMUNITY): Payer: Self-pay | Admitting: *Deleted

## 2023-12-30 ENCOUNTER — Other Ambulatory Visit: Payer: Self-pay

## 2023-12-30 ENCOUNTER — Emergency Department (HOSPITAL_COMMUNITY)
Admission: EM | Admit: 2023-12-30 | Discharge: 2023-12-31 | Disposition: A | Discharge status: Home / Self Care | Attending: Emergency Medicine | Admitting: Emergency Medicine

## 2023-12-30 DIAGNOSIS — M25472 Effusion, left ankle: Secondary | ICD-10-CM | POA: Insufficient documentation

## 2023-12-30 DIAGNOSIS — I10 Essential (primary) hypertension: Secondary | ICD-10-CM | POA: Diagnosis not present

## 2023-12-30 LAB — CBC
HCT: 41.5 % (ref 39.0–52.0)
Hemoglobin: 13.9 g/dL (ref 13.0–17.0)
MCH: 28.7 pg (ref 26.0–34.0)
MCHC: 33.5 g/dL (ref 30.0–36.0)
MCV: 85.6 fL (ref 80.0–100.0)
Platelets: 278 K/uL (ref 150–400)
RBC: 4.85 MIL/uL (ref 4.22–5.81)
RDW: 13.4 % (ref 11.5–15.5)
WBC: 15.5 K/uL — ABNORMAL HIGH (ref 4.0–10.5)
nRBC: 0 % (ref 0.0–0.2)

## 2023-12-30 LAB — COMPREHENSIVE METABOLIC PANEL WITH GFR
ALT: 25 U/L (ref 0–44)
AST: 17 U/L (ref 15–41)
Albumin: 3.9 g/dL (ref 3.5–5.0)
Alkaline Phosphatase: 72 U/L (ref 38–126)
Anion gap: 10 (ref 5–15)
BUN: 9 mg/dL (ref 6–20)
CO2: 23 mmol/L (ref 22–32)
Calcium: 9.2 mg/dL (ref 8.9–10.3)
Chloride: 104 mmol/L (ref 98–111)
Creatinine, Ser: 0.83 mg/dL (ref 0.61–1.24)
GFR, Estimated: >60 mL/min (ref >60)
Glucose, Bld: 108 mg/dL — ABNORMAL HIGH (ref 70–99)
Potassium: 3.4 mmol/L — ABNORMAL LOW (ref 3.5–5.1)
Sodium: 137 mmol/L (ref 135–145)
Total Bilirubin: 0.6 mg/dL (ref 0.0–1.2)
Total Protein: 7.6 g/dL (ref 6.5–8.1)

## 2023-12-30 MED ORDER — NAPROXEN 250 MG PO TABS
500.0000 mg | ORAL_TABLET | Freq: Once | ORAL | Status: AC
  Administered 2023-12-30: 500 mg via ORAL
  Filled 2023-12-30: qty 2

## 2023-12-30 MED ORDER — OXYCODONE HCL 5 MG PO TABS
5.0000 mg | ORAL_TABLET | Freq: Once | ORAL | Status: AC
  Administered 2023-12-30: 5 mg via ORAL
  Filled 2023-12-30: qty 1

## 2023-12-30 MED ORDER — DIPHENHYDRAMINE HCL 25 MG PO TABS: 25.0000 mg | ORAL_TABLET | Freq: Four times a day (QID) | ORAL | 0 refills | Status: DC

## 2023-12-30 MED ORDER — DIPHENHYDRAMINE HCL 25 MG PO CAPS
50.0000 mg | ORAL_CAPSULE | Freq: Once | ORAL | Status: AC
  Administered 2023-12-30: 50 mg via ORAL
  Filled 2023-12-30: qty 2

## 2023-12-30 NOTE — ED Provider Notes (Signed)
 [facility] Provider Note MRN:  [id number]  Arrival date & time: 12/30/23     Chief Complaint   bite to  ankle   History of Present Illness   Hector [name] is a 38 y.o. year-old adult with a history of HIV presenting to the ED with chief complaint of bite to ankle.  Sudden stinging sensation to the left ankle when sleeping tonight in bed.  Checked the room in the bed and did not find any bugs or creatures.  Not sure what happened.  Having a lot of swelling and redness to the area ever since.  Review of Systems  A thorough review of systems was obtained and all systems are negative except as noted in the HPI and PMH.   Patient's Health History    Past Medical History:  Diagnosis Date   Depression    Headache    History of fractured vertebra april 8th Diagnosed   Clemens out of a tree while climbing it.   Human immunodeficiency virus I infection (HCC) 2016   Hypertension    Immune deficiency disorder (HCC)    Mental disorder    Myocardial infarction [facility])     Past Surgical History:  Procedure Laterality Date   NO PAST SURGERIES      Family History  Problem Relation Age of Onset   CAD Mother 69       MI   Endocrine Disorder Neg Hx     Social History   Socioeconomic History   Marital status: Single    Spouse name: Not on file   Number of children: Not on file   Years of education: Not on file   Highest education level: Not on file  Occupational History   Not on file  Tobacco Use   Smoking status: Never   Smokeless tobacco: Never  Vaping Use   Vaping status: Never Used  Substance and Sexual Activity   Alcohol use: No   Drug use: No   Sexual activity: Never    Comment: DECL[place]ED [place]DOM[place]  Other Topics Concern   Not on file  Social History Narrative   Not on file   Social Drivers of Health   Financial Resource Strain: Not on file  Food Insecurity: Not on file  Transportation Needs: Not on file  Physical  Activity: Not on file  Stress: Not on file  Social Connections: Unknown (11/14/2022)   Received from [facility]   Social Network    Social Network: Not on file  Intimate Partner Violence: Unknown (11/14/2022)   Received from [facility]   HITS    Physically Hurt: Not on file    Insult or Talk Down To: Not on file    Threaten Physical Harm: Not on file    Scream or Curse: Not on file     Physical Exam   Vitals:   12/30/23 2118  BP: (!) 143/88  Pulse: 92  Resp: 15  Temp: 98.6 F (37 C)  SpO2: 100%    CONSTITUTIONAL: Well-appearing, NAD NEURO/PSYCH:  Alert and oriented x 3, no focal deficits EYES:  eyes equal and reactive ENT/NECK:  no LAD, no JVD CARDIO: Regular rate, well-perfused, normal S1 and S2 PULM:  CTAB no wheezing or rhonchi GI/GU:  non-distended, non-tender MSK/SPINE:  No gross deformities, no edema SKIN:  no rash, atraumatic   *Additional and/or pertinent findings included in MDM below  Diagnostic and Interventional Summary    EKG Interpretation Date/Time:    Ventricular  Rate:    PR Interval:    QRS Duration:    QT Interval:    QTC Calculation:   R Axis:      Text Interpretation:         Labs Reviewed  COMPREHENSIVE METABOLIC PANEL WITH GFR - Abnormal; Notable for the following components:      Result Value   Potassium 3.4 (*)    Glucose, Bld 108 (*)    All other components within normal limits  CBC - Abnormal; Notable for the following components:   WBC 15.5 (*)    All other components within normal limits    No orders to display    Medications  diphenhydrAMINE  (BENADRYL ) capsule 50 mg (50 mg Oral Given 12/30/23 2330)  oxyCODONE  (Oxy IR/ROXICODONE ) immediate release tablet 5 mg (5 mg Oral Given 12/30/23 2329)  naproxen  (NAPROSYN ) tablet 500 mg (500 mg Oral Given 12/30/23 2319)     Procedures  /  Critical Care Procedures  ED Course and Medical Decision Making  Initial Impression and Ddx Swelling and erythema to the lateral malleolus  of the left ankle, no other symptoms to suggest more systemic allergic response.  Suspect bug bite of some kind, patient did not find any snakes or spiders, doubt emergent process at this time.  Past medical/surgical history that increases complexity of ED encounter: None  Interpretation of Diagnostics I personally reviewed the Laboratory Testing and my interpretation is as follows: No significant blood count or electrolyte disturbance.    Patient Reassessment and Ultimate Disposition/Management     On reassessment with cold compress and medications the ankle looks better, not progressing in a way that would be worrisome.  Appropriate for discharge.  Patient management required discussion with the following services or consulting groups:  None  Complexity of Problems Addressed Acute complicated illness or Injury  Additional Data Reviewed and Analyzed Further history obtained from: None  Additional Factors Impacting ED Encounter Risk Prescriptions  Ozell HERO. Theadore, MD [facility] Emergency Medicine [facility] [email or web address]  Final Clinical Impressions(s) / ED Diagnoses     ICD-10-CM   1. Left ankle swelling  M25.472       ED Discharge Orders          Ordered    diphenhydrAMINE  (BENADRYL ) 25 MG tablet  Every 6 hours        12/30/23 2346             Discharge Instructions Discussed with and Provided to Patient:    Discharge Instructions      You were evaluated in the Emergency Department and after careful evaluation, we did not find any emergent condition requiring admission or further testing in the hospital.  Your exam/testing today was overall reassuring.  Symptoms may be due to an insect bite with local formation from an allergic response.  Take the Benadryl  medication as needed for swelling.  Continue to use cool compresses, compression.  Please return to the Emergency Department if you experience any worsening of your condition.   Thank you for allowing us  to be a part of your care.       Theadore Ozell HERO, MD 12/30/23 [phone]

## 2023-12-30 NOTE — Discharge Instructions (Signed)
 You were evaluated in the Emergency Department and after careful evaluation, we did not find any emergent condition requiring admission or further testing in the hospital.  Your exam/testing today was overall reassuring.  Symptoms may be due to an insect bite with local formation from an allergic response.  Take the Benadryl  medication as needed for swelling.  Continue to use cool compresses, compression.  Please return to the Emergency Department if you experience any worsening of your condition.  Thank you for allowing us  to be a part of your care.

## 2023-12-30 NOTE — ED Triage Notes (Signed)
 The pt was asleep around 1800 and he felt something bite his lt foot  he jumped up but did not see anything he was hardly able to walk  no bleeding from the wound approx 12 inch on his lt nakle    pain and swelling to the lt foot he cannot move his toes  and his foot and ankle he cannot feel there is a redness   to the lt lateral foot

## 2024-01-15 ENCOUNTER — Ambulatory Visit: Admitting: Infectious Diseases

## 2024-01-19 ENCOUNTER — Other Ambulatory Visit (HOSPITAL_COMMUNITY): Payer: Self-pay

## 2024-01-23 ENCOUNTER — Other Ambulatory Visit: Payer: Self-pay

## 2024-01-23 NOTE — Progress Notes (Signed)
 Specialty Pharmacy Refill Coordination Note  Hector [name] is a 38 y.o. adult contacted today regarding refills of specialty medication(s) Bictegravir-Emtricitab-Tenofov (Biktarvy )   Patient requested Delivery   Delivery date: 01/25/24   Verified address: New address - [address], [place] [address]   Medication will be filled on 01/24/24.

## 2024-01-29 ENCOUNTER — Ambulatory Visit (HOSPITAL_COMMUNITY): Admitting: Clinical

## 2024-02-01 ENCOUNTER — Telehealth: Payer: Self-pay

## 2024-02-01 NOTE — Telephone Encounter (Signed)
 Hector [name] called, she saw an UC/ED doctor in [place] who advised her that she may need medications for diabetes and high blood pressure. She reports frequent urination. States she has been trying to watch what she eats.  She does not currently have a PCP. We scheduled her for a new patient appointment with [facility] on 9/24. She was okay with coming to [facility] for this appointment.   Hector [name], BSN, RN

## 2024-02-08 ENCOUNTER — Ambulatory Visit: Admitting: Infectious Diseases

## 2024-02-14 ENCOUNTER — Ambulatory Visit (HOSPITAL_BASED_OUTPATIENT_CLINIC_OR_DEPARTMENT_OTHER): Admitting: Family Medicine

## 2024-02-16 ENCOUNTER — Other Ambulatory Visit: Payer: Self-pay

## 2024-02-19 ENCOUNTER — Other Ambulatory Visit: Payer: Self-pay

## 2024-02-19 NOTE — Progress Notes (Signed)
 Per Rph Delon N.; Re-sending Medication to patient [facility] 02/19/24 for 02/20/24. Medication sat a a point of access for about 3 weeks medications is being return to the pharmacy. Patient just found the slip that [employer] had left at patient door. Call has been retimed for about 3 weeks out.

## 2024-02-20 ENCOUNTER — Other Ambulatory Visit: Payer: Self-pay

## 2024-02-20 ENCOUNTER — Other Ambulatory Visit (HOSPITAL_COMMUNITY): Payer: Self-pay

## 2024-03-15 ENCOUNTER — Other Ambulatory Visit (HOSPITAL_COMMUNITY): Payer: Self-pay

## 2024-03-19 ENCOUNTER — Other Ambulatory Visit (HOSPITAL_COMMUNITY): Payer: Self-pay

## 2024-03-25 ENCOUNTER — Other Ambulatory Visit: Payer: Self-pay

## 2024-03-25 NOTE — Progress Notes (Signed)
 Specialty Pharmacy Refill Coordination Note  Hector [name] is a 38 y.o. adult contacted today regarding refills of specialty medication(s) Bictegravir-Emtricitab-Tenofov (Biktarvy )   Patient requested Delivery   Delivery date: 03/26/24   Verified address: [address], [place] [address]   Medication will be filled on: 03/25/24

## 2024-04-17 ENCOUNTER — Other Ambulatory Visit: Payer: Self-pay | Admitting: Pharmacist

## 2024-04-17 ENCOUNTER — Other Ambulatory Visit (HOSPITAL_COMMUNITY): Payer: Self-pay

## 2024-04-17 ENCOUNTER — Other Ambulatory Visit: Payer: Self-pay

## 2024-04-17 DIAGNOSIS — Z21 Asymptomatic human immunodeficiency virus [HIV] infection status: Secondary | ICD-10-CM

## 2024-04-17 MED ORDER — BIKTARVY 50-200-25 MG PO TABS
1.0000 | ORAL_TABLET | Freq: Every day | ORAL | 0 refills | Status: DC
  Filled 2024-04-17 – 2024-05-02 (×3): qty 30, 30d supply, fill #0

## 2024-04-19 ENCOUNTER — Other Ambulatory Visit (HOSPITAL_COMMUNITY): Payer: Self-pay

## 2024-04-22 ENCOUNTER — Other Ambulatory Visit: Payer: Self-pay

## 2024-04-24 ENCOUNTER — Other Ambulatory Visit: Payer: Self-pay

## 2024-05-02 ENCOUNTER — Other Ambulatory Visit: Payer: Self-pay

## 2024-05-02 ENCOUNTER — Other Ambulatory Visit (HOSPITAL_COMMUNITY): Payer: Self-pay

## 2024-05-02 NOTE — Progress Notes (Signed)
 Specialty Pharmacy Refill Coordination Note  Hector [name] is a 38 y.o. adult contacted today regarding refills of specialty medication(s) Bictegravir-Emtricitab-Tenofov (Biktarvy )   Patient requested Delivery   Delivery date: 05/06/24   Verified address: [address], [place] [address]   Medication will be filled on: 05/03/24

## 2024-05-03 ENCOUNTER — Other Ambulatory Visit: Payer: Self-pay

## 2024-05-21 ENCOUNTER — Ambulatory Visit (HOSPITAL_BASED_OUTPATIENT_CLINIC_OR_DEPARTMENT_OTHER)

## 2024-05-24 ENCOUNTER — Other Ambulatory Visit: Payer: Self-pay

## 2024-05-24 ENCOUNTER — Other Ambulatory Visit: Payer: Self-pay | Admitting: Infectious Diseases

## 2024-05-24 ENCOUNTER — Other Ambulatory Visit (HOSPITAL_COMMUNITY): Payer: Self-pay

## 2024-05-24 DIAGNOSIS — Z21 Asymptomatic human immunodeficiency virus [HIV] infection status: Secondary | ICD-10-CM

## 2024-05-24 MED ORDER — BIKTARVY 50-200-25 MG PO TABS
1.0000 | ORAL_TABLET | Freq: Every day | ORAL | 0 refills | Status: AC
  Filled 2024-05-24 – 2024-06-18 (×3): qty 30, 30d supply, fill #0

## 2024-05-27 ENCOUNTER — Other Ambulatory Visit: Payer: Self-pay

## 2024-05-27 ENCOUNTER — Ambulatory Visit (INDEPENDENT_AMBULATORY_CARE_PROVIDER_SITE_OTHER): Admitting: Internal Medicine

## 2024-05-27 ENCOUNTER — Encounter: Payer: Self-pay | Admitting: Internal Medicine

## 2024-05-27 ENCOUNTER — Other Ambulatory Visit

## 2024-05-27 VITALS — BP 144/94 | HR 88 | Ht 74.0 in | Wt 323.0 lb

## 2024-05-27 DIAGNOSIS — R030 Elevated blood-pressure reading, without diagnosis of hypertension: Secondary | ICD-10-CM

## 2024-05-27 DIAGNOSIS — F640 Transsexualism: Secondary | ICD-10-CM

## 2024-05-27 NOTE — Progress Notes (Unsigned)
 "  Name: Hector [name]  MRN/ DOB: [id number], [birth year]    Age/ Sex: 39 y.o., adult     PCP: Patient, No Pcp Per   Reason for Endocrinology Evaluation: Gender dysphoria     Initial Endocrinology Clinic Visit: 07/11/2018    PATIENT IDENTIFIER: Hector [name] is a 39 y.o., adult with a past medical history of HIV, schizoaffective disorder, gender dysphoria. She has followed with [facility] since 07/11/2018 for consultative assistance with management of her gender dysphoria.   HISTORICAL SUMMARY: The patient has a diagnosis of gender dysphoria.  She was started on estrogen replacement therapy in 2020.  Patient was started on dulasteride  through  her previous endocrinologist in 2021   Patient was followed by Dr. Kassie from 2020 until 2022  Switch from oral estradiol  to intramuscular estradiol  valerate based on patient request 01/2023 as she was having vomiting that she attributed to estradiol  intake   SUBJECTIVE:    Today (05/27/2024):  Hector [name] is here for follow-up on gender dysphoria.  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   Human immunodeficiency virus I infection (HCC) 2016   Hypertension    Immune deficiency disorder    Mental disorder    Myocardial infarction [facility])    Past Surgical History:  Past Surgical History:  Procedure Laterality Date   NO PAST SURGERIES     Social History:  reports that she has never smoked. She has never used smokeless tobacco. She reports that she does not drink alcohol and does not use  drugs. Family History:  Family History  Problem Relation Age of Onset   CAD Mother 56       MI   Endocrine Disorder Neg Hx      HOME MEDICATIONS: Allergies as of 05/27/2024       Reactions   Tomato Anaphylaxis, Rash   Risperdal [risperidone] Nausea And Vomiting   Aspirin Itching        Medication List        Accurate as of May 27, 2024  1:58 PM. If you have any questions, ask your nurse or doctor.          STOP taking these medications    diphenhydrAMINE  25 MG tablet Commonly known as: BENADRYL  Stopped by: Donell Butts, MD       TAKE these medications    Biktarvy  50-200-25 MG Tabs tablet Generic drug: bictegravir-emtricitabine -tenofovir  AF Take 1 tablet by mouth daily.   estradiol  valerate 20 MG/ML injection Commonly known as: DELESTROGEN  Inject 1 mL (20 mg total) into the muscle every 14 (fourteen) days.   hydrocortisone  25 MG suppository Commonly known as: ANUSOL -HC Place 1 suppository (25 mg total) rectally 2 (two) times daily.   losartan  50 MG tablet Commonly known as: Cozaar  Take 1 tablet (50 mg total) by mouth daily.   meloxicam 7.5 MG tablet Commonly known as: MOBIC Take 1 tablet PO twice a day for 2 weeks and then PRN   mirtazapine  15 MG tablet Commonly known as:  Remeron  Take 1 tablet (15 mg total) by mouth at bedtime.   NEEDLE (DISP) 25 G 25G X 1 Misc 1 Device by Does not apply route every 14 (fourteen) days.   spironolactone  100 MG tablet Commonly known as: ALDACTONE  Take 1 tablet (100 mg total) by mouth 2 (two) times daily.   Syringe (Disposable) 1 ML Misc 1 Device by Does not apply route every 14 (fourteen) days.          OBJECTIVE:   PHYSICAL EXAM: VS: BP (!) 168/90   Pulse 88   Ht 6' 2 (1.88 m)   Wt (!) 323 lb (146.5 kg)   SpO2 (!) 88%   BMI 41.47 kg/m    EXAM: General: Pt appears well and is in NAD  Lungs: Clear with good BS bilat   Heart: Auscultation: RRR.  Extremities:  BL LE: No pretibial  edema normal  Mental Status: Judgment, insight: Intact Orientation: Oriented to time, place, and person Mood and affect: No depression, anxiety, or agitation     DATA REVIEWED:  Estradiol , LH, testosterone , and TFTs are pending   ASSESSMENT / PLAN / RECOMMENDATIONS:   Gender Dysphoria :  - M to F - Patient has noted improvement with facial hair growth, but is disappointed in lack of by size change.  Patient is interested in chest feminization surgery - A referral to plastic surgery who is specialized in transgender care was placed -Patient assures me compliance - Estradiol , testosterone , LH, and TFTs are pending  Medications   spironolactone  100 mg BID Estradiol  Valerate 20 mg/mL every 14 days   2. Elevated BP:  - Patient asymptomatic - Patient has a prescription of losartan  that was prescribed in May, 2025 through infectious disease.  Patient has no knowledge of this prescription and has not been taking it - I did encourage the patient to follow-up with ID/PCP for further monitoring of blood pressure  Follow-up in 6 months  I have  spent 25 minutes preparing to see the patient by review of recent labs, imaging and procedures, obtaining and reviewing separately obtained history, communicating with the patient, ordering medications, tests or procedures, and documenting clinical information in the EHR including the differential Dx, treatment, and any further evaluation and other management   Signed electronically by: Stefano Redgie Butts, MD  [facility] Endocrinology  [facility] [address] [place] [address] [place], [place] [address] Phone: [phone] FAX: [phone]      CC: Patient, No Pcp Per No address on file Phone: None  Fax: None   Return to Endocrinology clinic as below: No future appointments.    "

## 2024-05-28 ENCOUNTER — Other Ambulatory Visit

## 2024-05-29 ENCOUNTER — Other Ambulatory Visit: Payer: Self-pay

## 2024-05-31 ENCOUNTER — Other Ambulatory Visit: Payer: Self-pay

## 2024-06-18 ENCOUNTER — Other Ambulatory Visit: Payer: Self-pay

## 2024-06-18 ENCOUNTER — Other Ambulatory Visit (HOSPITAL_COMMUNITY): Payer: Self-pay

## 2024-06-18 NOTE — Progress Notes (Signed)
 Specialty Pharmacy Refill Coordination Note  Hector [name] is a 39 y.o. adult contacted today regarding refills of specialty medication(s) Bictegravir-Emtricitab-Tenofov (Biktarvy )   Patient requested Delivery   Delivery date: 06/19/24   Verified address: [address], [address], [place] [address]   Medication will be filled on: 06/18/24   Patient is aware to expect possible delays in delivery due to recent winter weather.

## 2024-06-19 ENCOUNTER — Other Ambulatory Visit: Payer: Self-pay

## 2024-11-25 ENCOUNTER — Ambulatory Visit: Admitting: Internal Medicine
